# Patient Record
Sex: Male | Born: 1990 | Race: Black or African American | Hispanic: No | Marital: Single | Smoking: Former smoker
Health system: Southern US, Community
[De-identification: ages and names within clinical notes are randomized; demographics above are authoritative.]

## PROBLEM LIST (undated history)

## (undated) DIAGNOSIS — F329 Major depressive disorder, single episode, unspecified: Secondary | ICD-10-CM

## (undated) DIAGNOSIS — F4310 Post-traumatic stress disorder, unspecified: Secondary | ICD-10-CM

## (undated) DIAGNOSIS — F29 Unspecified psychosis not due to a substance or known physiological condition: Secondary | ICD-10-CM

## (undated) DIAGNOSIS — F32A Depression, unspecified: Secondary | ICD-10-CM

---

## 2010-11-24 ENCOUNTER — Emergency Department (HOSPITAL_COMMUNITY): Payer: Medicaid Other

## 2010-11-24 ENCOUNTER — Observation Stay (HOSPITAL_COMMUNITY)
Admission: EM | Admit: 2010-11-24 | Discharge: 2010-11-24 | DRG: 948 | Discharge status: Against Medical Advice | Payer: Medicaid Other | Attending: Internal Medicine | Admitting: Internal Medicine

## 2010-11-24 DIAGNOSIS — J029 Acute pharyngitis, unspecified: Secondary | ICD-10-CM | POA: Insufficient documentation

## 2010-11-24 DIAGNOSIS — R4182 Altered mental status, unspecified: Principal | ICD-10-CM | POA: Insufficient documentation

## 2010-11-24 DIAGNOSIS — R51 Headache: Secondary | ICD-10-CM | POA: Insufficient documentation

## 2010-11-24 LAB — RAPID URINE DRUG SCREEN, HOSP PERFORMED
Amphetamines: NOT DETECTED
Barbiturates: NOT DETECTED
Opiates: NOT DETECTED

## 2010-11-24 LAB — POCT I-STAT, CHEM 8
BUN: 14 mg/dL (ref 6–23)
Calcium, Ion: 1.1 mmol/L — ABNORMAL LOW (ref 1.12–1.32)
Creatinine, Ser: 1.5 mg/dL (ref 0.4–1.5)
TCO2: 28 mmol/L (ref 0–100)

## 2010-11-24 LAB — DIFFERENTIAL
Basophils Absolute: 0 10*3/uL (ref 0.0–0.1)
Basophils Relative: 0 % (ref 0–1)
Eosinophils Absolute: 0 10*3/uL (ref 0.0–0.7)
Eosinophils Relative: 0 % (ref 0–5)
Lymphocytes Relative: 9 % — ABNORMAL LOW (ref 12–46)
Lymphs Abs: 1.1 10*3/uL (ref 0.7–4.0)
Monocytes Absolute: 0.7 10*3/uL (ref 0.1–1.0)
Monocytes Relative: 6 % (ref 3–12)
Neutro Abs: 10.1 10*3/uL — ABNORMAL HIGH (ref 1.7–7.7)
Neutrophils Relative %: 85 % — ABNORMAL HIGH (ref 43–77)

## 2010-11-24 LAB — CBC
HCT: 46.4 % (ref 39.0–52.0)
Hemoglobin: 16.6 g/dL (ref 13.0–17.0)
MCH: 30.2 pg (ref 26.0–34.0)
MCHC: 35.8 g/dL (ref 30.0–36.0)
MCV: 84.4 fL (ref 78.0–100.0)
Platelets: 242 10*3/uL (ref 150–400)
RBC: 5.5 MIL/uL (ref 4.22–5.81)
RDW: 13.2 % (ref 11.5–15.5)
WBC: 11.9 10*3/uL — ABNORMAL HIGH (ref 4.0–10.5)

## 2010-11-24 LAB — COMPREHENSIVE METABOLIC PANEL
ALT: 26 U/L (ref 0–53)
AST: 27 U/L (ref 0–37)
Albumin: 4.4 g/dL (ref 3.5–5.2)
Alkaline Phosphatase: 59 U/L (ref 39–117)
Glucose, Bld: 101 mg/dL — ABNORMAL HIGH (ref 70–99)
Potassium: 3.9 mEq/L (ref 3.5–5.1)
Sodium: 138 mEq/L (ref 135–145)
Total Protein: 7.5 g/dL (ref 6.0–8.3)

## 2010-11-24 LAB — URINALYSIS, ROUTINE W REFLEX MICROSCOPIC
Glucose, UA: NEGATIVE mg/dL
Ketones, ur: NEGATIVE mg/dL
Nitrite: NEGATIVE
Protein, ur: NEGATIVE mg/dL

## 2010-11-24 LAB — APTT: aPTT: 27 seconds (ref 24–37)

## 2010-11-24 LAB — PROTIME-INR: INR: 0.97 (ref 0.00–1.49)

## 2010-11-24 LAB — RPR: RPR Ser Ql: NONREACTIVE

## 2010-11-24 LAB — URINE MICROSCOPIC-ADD ON

## 2010-11-24 LAB — ETHANOL: Alcohol, Ethyl (B): <5 mg/dL (ref 0–10)

## 2010-11-25 LAB — URINE CULTURE: Culture  Setup Time: 201204240841

## 2010-11-25 LAB — ANA: Anti Nuclear Antibody(ANA): NEGATIVE

## 2010-12-02 NOTE — H&P (Signed)
NAMEGERASIMOS, PLOTTS              ACCOUNT NO.:  [id number]  MEDICAL RECORD NO.:  [id number]           PATIENT TYPE:  I  LOCATION:  4705                         FACILITY:  [facility]  PHYSICIAN:  Massie Maroon, MD        DATE OF BIRTH:  [birth year]  DATE OF ADMISSION:  11/24/2010 DATE OF DISCHARGE:                             HISTORY & PHYSICAL   CHIEF COMPLAINT:  Headache, altered mental status, ? syncope.  HISTORY OF PRESENT ILLNESS:  A 20 year old male with complaints of some slight sore throat who was apparently walking outside when he complained of some headache as well as question of falling down according to his girlfriend.  She is not sure if he had an episode of syncope or not. She has not witnessed any overt seizure activity and there is no any evidence of incontinence.  The patient denies any fever, chills, cough. The patient had a CT brain that was negative for any acute process. Chest x-ray was negative for any acute process as well.  Ammonia level is normal.  Urine drug screen showed evidence of marijuana.  Alcohol level was normal.  The patient will be admitted for altered mental status, ? syncope.  PAST MEDICAL HISTORY:  None.  PAST SURGICAL HISTORY:  None.  SOCIAL HISTORY:  The patient smokes one pack per day for unknown duration.  He drinks occasionally.  FAMILY HISTORY:  None per the patient.  ALLERGIES:  No known drug allergies.  MEDICATIONS:  Unknown, none.  REVIEW OF SYSTEMS:  Negative for all 10-organ systems except for pertinent positives stated above.  PHYSICAL EXAMINATION:  VITAL SIGNS:  Temperature 98.8, pulse 85, blood pressure 122/72, pulse ox 100% on room air. HEENT:  Anicteric.  Pupils 1.5 mm, symmetric.  Direct consensual near reflexes intact.  Mucous membranes moist.  Tongue midline. NECK:  No JVD, no bruit. HEART:  Regular rate, rhythm.  S1, S2.  No murmurs, gallops, or rubs. LUNGS:  Clear to auscultation bilaterally. ABDOMEN:  Soft,  nontender, nondistended.  Positive bowel sounds. EXTREMITIES:  No cyanosis, clubbing, or edema. SKIN:  No rashes. LYMPH NODES:  No adenopathy. NEUROLOGIC:  Nonfocal.  Cranial nerves II through XII intact.  Reflexes 2+, symmetric, diffuse with downgoing toes bilaterally, motor strength 5/5 in all 4 extremities, pinprick intact.  LABORATORY DATA:  Ammonia 16, urine drug screen positive for marijuana. Urinalysis; wbc's 3-6.  WBC 11.9, hemoglobin 16.6, platelet count 242. BUN 14, creatinine 1.5, sodium 138, potassium 3.9.  CT brain negative for any acute process.  Chest x-ray negative for any acute process.  ASSESSMENT AND PLAN: 1. Altered mental status:  Check a B12, folic acid, ESR, ANA, RPR,     TSH. 2. ? Syncope:  Check an MRI brain, check carotid ultrasound, check     cardiac 2-D echo.  The patient will be maintained on telemetry. 3. Tobacco dependence:  The patient was counseled on smoking cessation     x10 minutes. 4. Deep venous thrombosis prophylaxis:  SCDs and TEDs.     Massie Maroon, MD     JYK/MEDQ  D:  11/24/2010  T:  11/24/2010  Job:  [id number]  Electronically Signed by Pearson Grippe MD on 12/02/2010 11:28:27 PM

## 2011-01-09 NOTE — Discharge Summary (Signed)
  NAMESHIV, SHUEY              ACCOUNT NO.:  [id number]  MEDICAL RECORD NO.:  [id number]           PATIENT TYPE:  I  LOCATION:  4705                         FACILITY:  [facility]  PHYSICIAN:  Massie Maroon, MD        DATE OF BIRTH:  June 28, 1991  DATE OF ADMISSION:  11/24/2010 DATE OF DISCHARGE:  11/24/2010                              DISCHARGE SUMMARY   DISCHARGE DIAGNOSIS: 1. Question of syncope. 2. Altered mental status. 3. Tobacco dependence.  HOSPITAL COURSE:  A 20 year old male with complaints of slight sore throat, was apparently walking outside when he complained of some headache as well as some question of falling down according to his girlfriend.  She was not sure if he had an episode of syncope or not. She did not witness any overt seizure activity and there was no evidence of any incontinence.  The patient had a CT brain that was negative for any acute process.  Ammonia level was normal.  Urine drug screen showed marijuana.  Alcohol level was normal.  The patient was supposed to be admitted for altered mental status, ?syncope; however, he left AMA.     Massie Maroon, MD     JYK/MEDQ  D:  12/17/2010  T:  12/17/2010  Job:  [id number]  Electronically Signed by Pearson Grippe MD on 01/09/2011 08:54:50 AM

## 2012-02-12 IMAGING — CT CT HEAD W/O CM
1 of 2 series · 15 of 30 positions shown, 19 images · non-contrast
Comparison: None.

CLINICAL DATA: Headache; altered level of consciousness.
Unresponsive after fall.

CT HEAD WITHOUT CONTRAST
TECHNIQUE: Contiguous axial images were obtained from the base of
the skull through the vertex without contrast.

[Series 2: head trauma 4.8 h37s · axial · 0.43mm/px · z∈[+1263,+1393]mm · 15 of 30 slices shown, 19 images]
[im 2/30  brain]
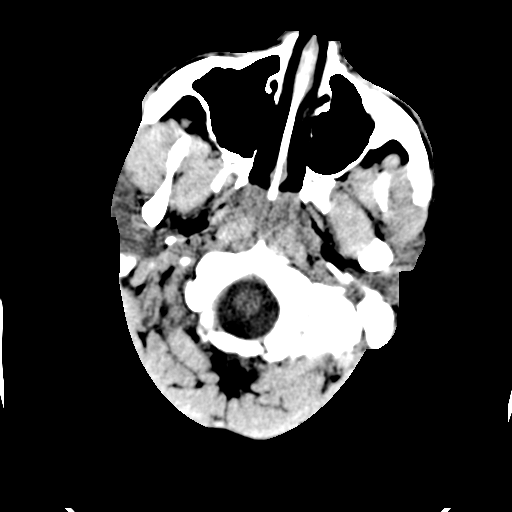
[im 2/30  bone]
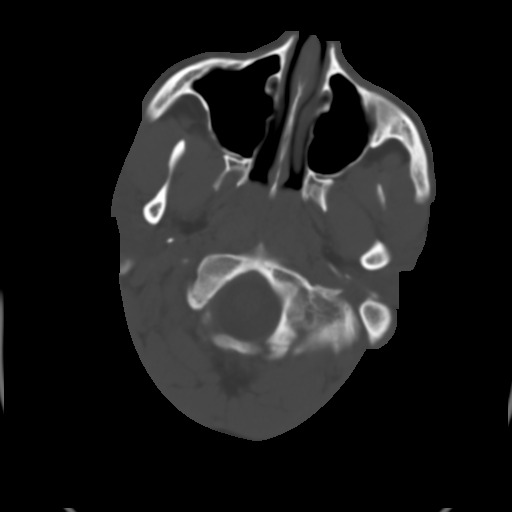
[im 4/30  brain]
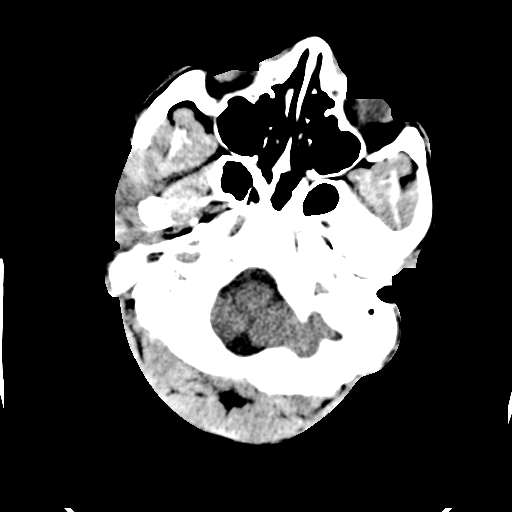
[im 5/30  brain]
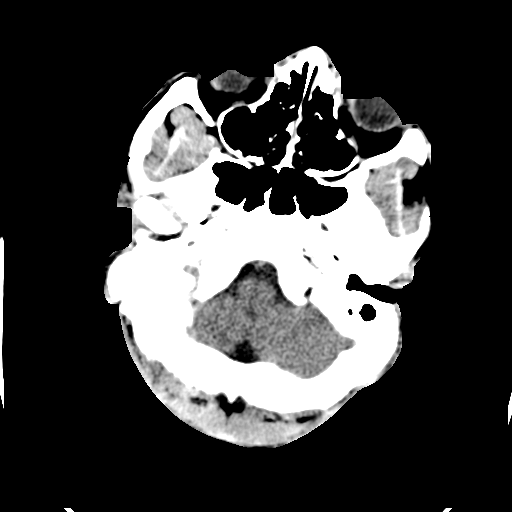
[im 7/30  brain]
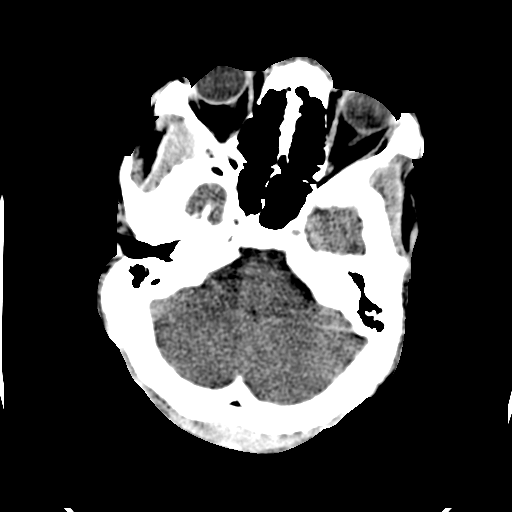
[im 10/30  brain]
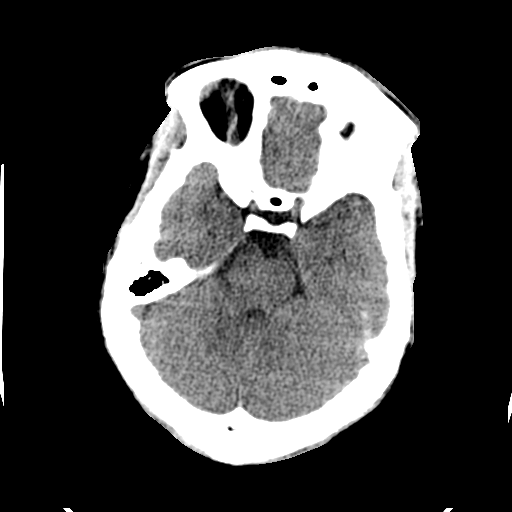
[im 10/30  bone]
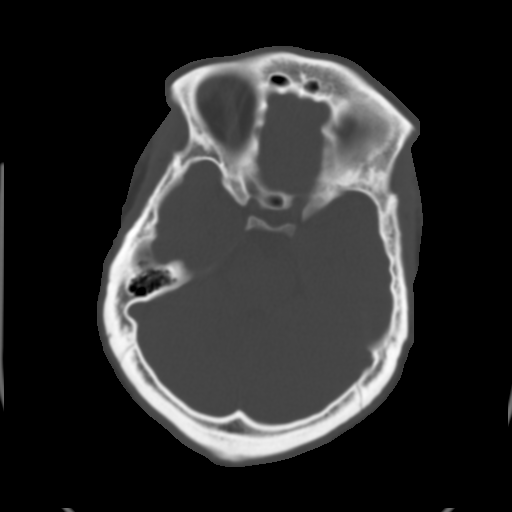
[im 12/30  brain]
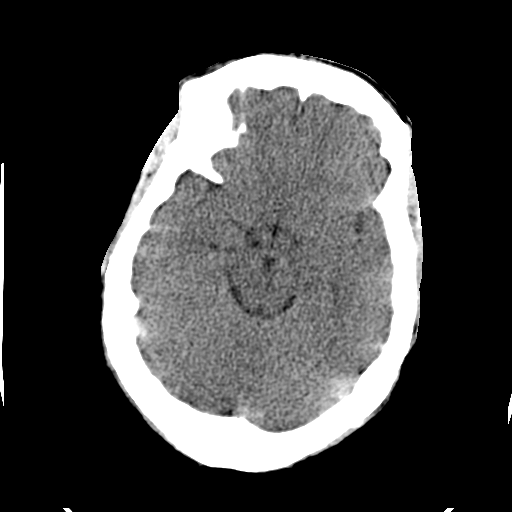
[im 13/30  brain]
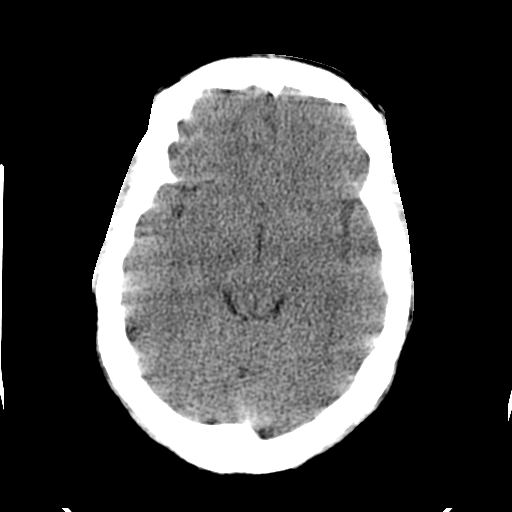
[im 15/30  brain]
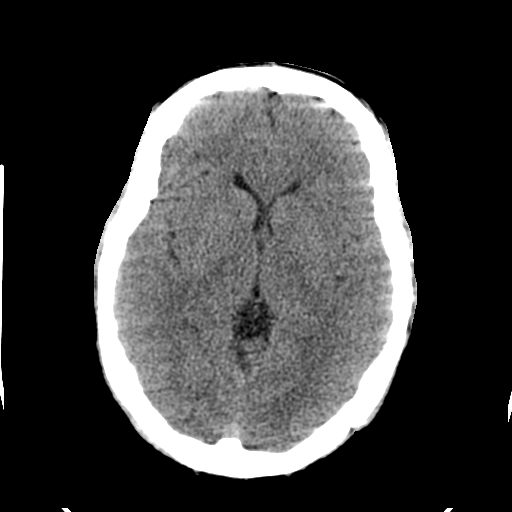
[im 17/30  brain]
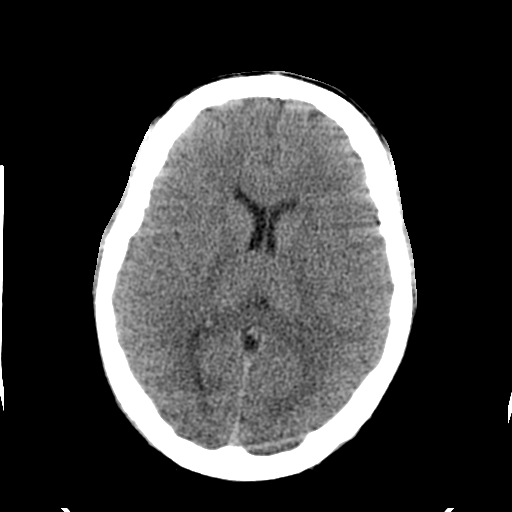
[im 17/30  bone]
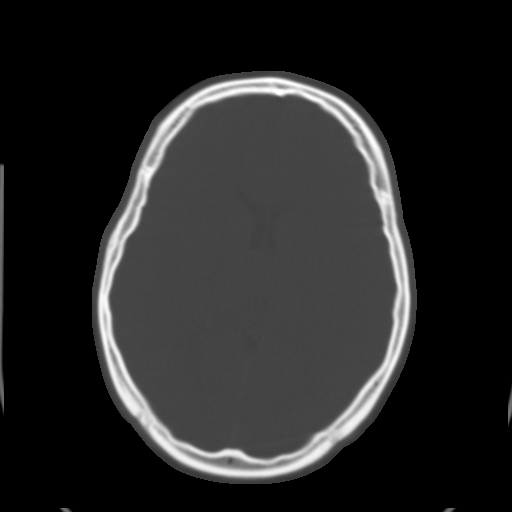
[im 18/30  brain]
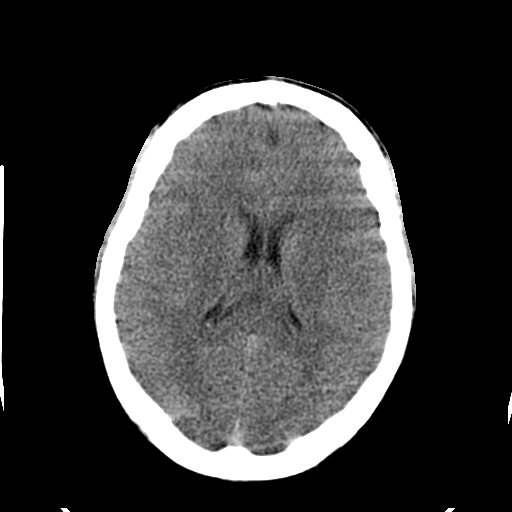
[im 20/30  brain]
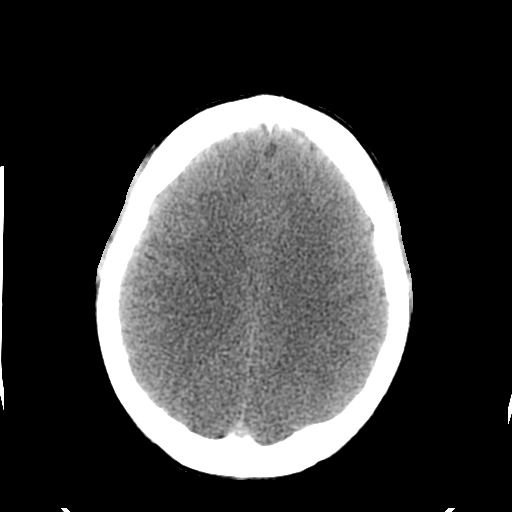
[im 23/30  brain]
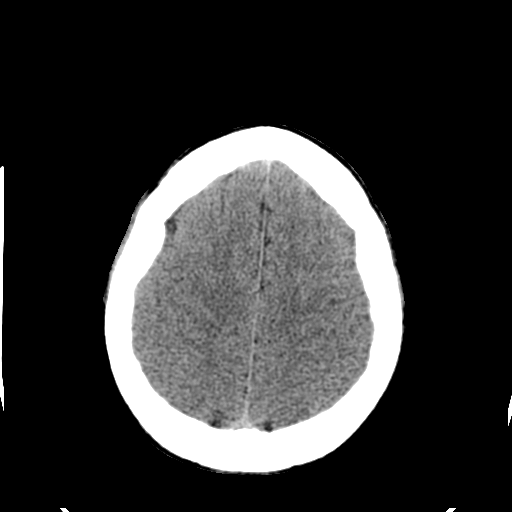
[im 25/30  brain]
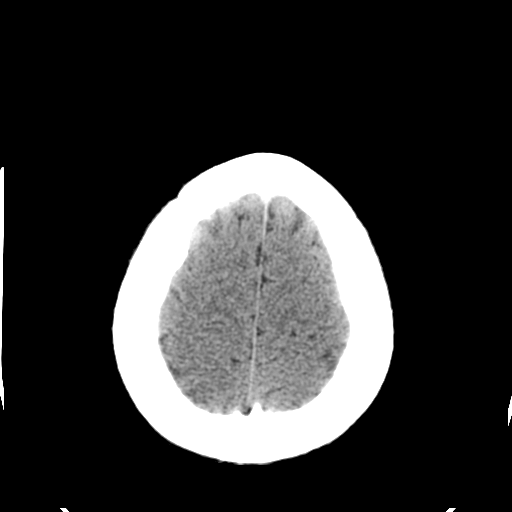
[im 25/30  bone]
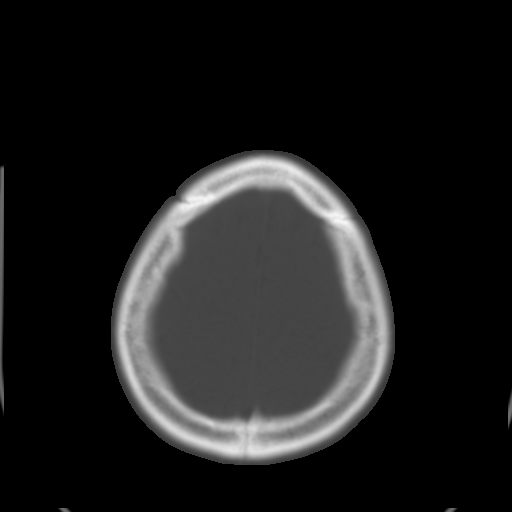
[im 26/30  brain]
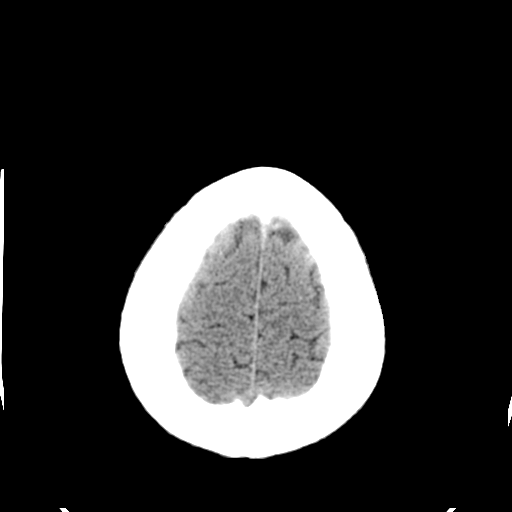
[im 28/30  brain]
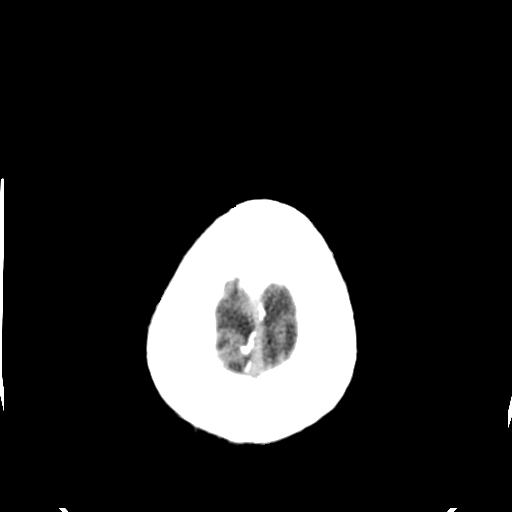

[15 of 30 positions shown; findings below may reference images not displayed]

FINDINGS: There is no evidence of acute infarction, mass lesion, or
intra- or extra-axial hemorrhage on CT.

The posterior fossa, including the cerebellum, brainstem and fourth
ventricle, is within normal limits.  The third and lateral
ventricles, and basal ganglia are unremarkable in appearance.  The
cerebral hemispheres are symmetric in appearance, with normal gray-
white differentiation.  No mass effect or midline shift is seen.

There is no evidence of fracture; visualized osseous structures are
unremarkable in appearance.  The orbits are within normal limits.
The paranasal sinuses and mastoid air cells are well-aerated.  No
significant soft tissue abnormalities are seen.
IMPRESSION: No evidence of traumatic intracranial injury or fracture.

## 2014-05-31 ENCOUNTER — Emergency Department (HOSPITAL_COMMUNITY)
Admission: EM | Admit: 2014-05-31 | Discharge: 2014-06-02 | Disposition: A | Discharge status: Other Acute Inpatient Hospital | Payer: Self-pay | Attending: Emergency Medicine | Admitting: Emergency Medicine

## 2014-05-31 ENCOUNTER — Encounter (HOSPITAL_COMMUNITY): Payer: Self-pay | Admitting: Emergency Medicine

## 2014-05-31 DIAGNOSIS — R45850 Homicidal ideations: Secondary | ICD-10-CM

## 2014-05-31 DIAGNOSIS — F29 Unspecified psychosis not due to a substance or known physiological condition: Secondary | ICD-10-CM | POA: Insufficient documentation

## 2014-05-31 DIAGNOSIS — F1210 Cannabis abuse, uncomplicated: Secondary | ICD-10-CM | POA: Insufficient documentation

## 2014-05-31 DIAGNOSIS — F39 Unspecified mood [affective] disorder: Secondary | ICD-10-CM | POA: Diagnosis present

## 2014-05-31 HISTORY — DX: Unspecified psychosis not due to a substance or known physiological condition: F29

## 2014-05-31 LAB — SALICYLATE LEVEL: Salicylate Lvl: <2 mg/dL — ABNORMAL LOW (ref 2.8–20.0)

## 2014-05-31 LAB — COMPREHENSIVE METABOLIC PANEL
ALT: 33 U/L (ref 0–53)
AST: 22 U/L (ref 0–37)
Albumin: 3.3 g/dL — ABNORMAL LOW (ref 3.5–5.2)
Alkaline Phosphatase: 45 U/L (ref 39–117)
Anion gap: 11 (ref 5–15)
BILIRUBIN TOTAL: 0.3 mg/dL (ref 0.3–1.2)
BUN: 10 mg/dL (ref 6–23)
CHLORIDE: 101 meq/L (ref 96–112)
CO2: 27 mEq/L (ref 19–32)
CREATININE: 1.14 mg/dL (ref 0.50–1.35)
Calcium: 9.1 mg/dL (ref 8.4–10.5)
GFR calc Af Amer: >90 mL/min (ref >90)
GFR, EST NON AFRICAN AMERICAN: 89 mL/min — AB (ref >90)
Glucose, Bld: 142 mg/dL — ABNORMAL HIGH (ref 70–99)
Potassium: 3.7 mEq/L (ref 3.7–5.3)
Sodium: 139 mEq/L (ref 137–147)
Total Protein: 6.5 g/dL (ref 6.0–8.3)

## 2014-05-31 LAB — CBC
HEMATOCRIT: 45.5 % (ref 39.0–52.0)
Hemoglobin: 15.8 g/dL (ref 13.0–17.0)
MCH: 29.9 pg (ref 26.0–34.0)
MCHC: 34.7 g/dL (ref 30.0–36.0)
MCV: 86 fL (ref 78.0–100.0)
PLATELETS: 264 10*3/uL (ref 150–400)
RBC: 5.29 MIL/uL (ref 4.22–5.81)
RDW: 13.2 % (ref 11.5–15.5)
WBC: 8.4 10*3/uL (ref 4.0–10.5)

## 2014-05-31 LAB — RAPID URINE DRUG SCREEN, HOSP PERFORMED
AMPHETAMINES: NOT DETECTED
BENZODIAZEPINES: NOT DETECTED
Barbiturates: NOT DETECTED
Cocaine: NOT DETECTED
Opiates: NOT DETECTED
Tetrahydrocannabinol: POSITIVE — AB

## 2014-05-31 LAB — ETHANOL

## 2014-05-31 LAB — ACETAMINOPHEN LEVEL: Acetaminophen (Tylenol), Serum: <15 ug/mL (ref 10–30)

## 2014-05-31 MED ORDER — LORAZEPAM 1 MG PO TABS: 1.0000 mg | ORAL_TABLET | Freq: Three times a day (TID) | ORAL | Status: DC | PRN

## 2014-05-31 MED ORDER — RISPERIDONE 2 MG PO TABS
2.0000 mg | ORAL_TABLET | Freq: Once | ORAL | Status: AC
  Administered 2014-06-01: 2 mg via ORAL
  Filled 2014-05-31: qty 1

## 2014-05-31 MED ORDER — ACETAMINOPHEN 325 MG PO TABS: 650.0000 mg | ORAL_TABLET | ORAL | Status: DC | PRN

## 2014-05-31 MED ORDER — BENZTROPINE MESYLATE 1 MG PO TABS
0.5000 mg | ORAL_TABLET | Freq: Two times a day (BID) | ORAL | Status: DC
  Administered 2014-06-01 (×3): 0.5 mg via ORAL
  Filled 2014-05-31: qty 1

## 2014-05-31 MED ORDER — ONDANSETRON HCL 4 MG PO TABS: 4.0000 mg | ORAL_TABLET | Freq: Three times a day (TID) | ORAL | Status: DC | PRN

## 2014-05-31 MED ORDER — IBUPROFEN 200 MG PO TABS: 600.0000 mg | ORAL_TABLET | Freq: Three times a day (TID) | ORAL | Status: DC | PRN

## 2014-05-31 NOTE — ED Notes (Signed)
Patient IVC by his [employer]. [facility]. Patient was prescribed 3 medications and has not gotten them filled. Patient making statements that different individuals "have it coming to them". [facility] and [employer] engineer, contractingmilitary staff. Per [employer] patient is "mouthy" and "pissed" about this situation.

## 2014-05-31 NOTE — BH Assessment (Signed)
Assessment completed. Awaiting copy of IVC paperwork to make recommendation.

## 2014-05-31 NOTE — ED Provider Notes (Signed)
CSN: [id number]     Arrival date & time 05/31/14  2137 History  This chart was scribed for non-physician practitioner, Terri Piedraourtney Forcucci, PA-C, working with Toy CookeyMegan Docherty, MD, by Modena JanskyAlbert Thayil, ED Scribe. This patient was seen in room [facility]CON/[facility] and the patient's care was started at 10:45 PM.  Chief Complaint  Patient presents with  . IVC    The history is provided by the patient. No language interpreter was used.   HPI Comments: Gunnar BullaDerrick Cimmino is a 23 y.o. male with no hx of chronic medical problems who presents to the Emergency Department complaining of an IVC. Pt was [employer]C by [employer]. Pt was recently seen at [facility] and prescribed 3 medications and has not gotten them filled. Pt states that he has been sleeping more than usual since his [facility] visit. Psychologist, occupationalMilitary officer reports that pt has been making generic statements of paranoia like for example, "he's going to get what's coming to him". Pt denies any recent emotional trauma. He denies any HI, SI, sleep disturbance, decreased appetite, weight change, chest pain, SOB, or abdominal pain.   Past Medical History  Diagnosis Date  . Psychotic disorder    No past surgical history on file. No family history on file. History  Substance Use Topics  . Smoking status: Not on file  . Smokeless tobacco: Not on file  . Alcohol Use: Not on file    Review of Systems  Constitutional: Negative for appetite change and unexpected weight change.  Respiratory: Negative for shortness of breath.   Cardiovascular: Negative for chest pain.  Gastrointestinal: Negative for abdominal pain.  Psychiatric/Behavioral: Negative for suicidal ideas and sleep disturbance.  All other systems reviewed and are negative.   Allergies  Review of patient's allergies indicates not on file.  Home Medications   Prior to Admission medications   Not on File   There were no vitals taken for this visit. Physical Exam  Nursing note and vitals  reviewed. Constitutional: He is oriented to person, place, and time. He appears well-developed and well-nourished. No distress.  HENT:  Head: Normocephalic and atraumatic.  Mouth/Throat: Oropharynx is clear and moist. No oropharyngeal exudate.  Neck: Neck supple. No JVD present. No thyromegaly present.  Cardiovascular: Normal rate, regular rhythm, normal heart sounds and intact distal pulses.  Exam reveals no gallop and no friction rub.   No murmur heard. Pulmonary/Chest: Effort normal and breath sounds normal. No respiratory distress. He has no wheezes. He has no rales. He exhibits no tenderness.  Abdominal: Soft. Bowel sounds are normal. He exhibits no distension and no mass. There is no tenderness. There is no rebound and no guarding.  Musculoskeletal: Normal range of motion.  Lymphadenopathy:    He has no cervical adenopathy.  Neurological: He is alert and oriented to person, place, and time.  Skin: Skin is warm.  Psychiatric: His speech is normal. His affect is blunt. He is withdrawn. Thought content is not paranoid. He expresses no homicidal and no suicidal ideation. He expresses no suicidal plans and no homicidal plans.    ED Course  Procedures (including critical care time) DIAGNOSTIC STUDIES:  COORDINATION OF CARE: 10:49 PM- Pt advised of plan for treatment and pt agrees.  Labs Review Labs Reviewed  CBC  ACETAMINOPHEN LEVEL  COMPREHENSIVE METABOLIC PANEL  ETHANOL  SALICYLATE LEVEL  URINE RAPID DRUG SCREEN (HOSP PERFORMED)    Imaging Review No results found.   EKG Interpretation None      MDM   1.  IVC  2.  Psychotic disorder  Patient is a 23 y.o. Male who presents to the ED for IVC.  Patient is not forthcoming on interview.  CO reports threatening comments being made.  Basic labs drawn here and are unremarkable.  Patient is medically cleared at this time.  Patient placed in psych hold.  Patient seen by TTS who will have the patient evaluated in the morning by  psychiatry.    I personally performed the services described in this documentation, which was scribed in my presence. The recorded information has been reviewed and is accurate.    Eben Burowourtney A Forcucci, PA-C 06/01/14 [phone]

## 2014-05-31 NOTE — ED Notes (Signed)
Patient states he is here for "nothing, I didn't bring myself here". Patient not willing to talk about why he is here or what has been going on. Patient denies PMH, PSH, denies allergies. 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 Patient states he did those when he was in [place], when questioned when that was he states "not long ago". Denies SI, Denies HI.

## 2014-06-01 ENCOUNTER — Encounter (HOSPITAL_COMMUNITY): Payer: Self-pay | Admitting: Psychiatry

## 2014-06-01 DIAGNOSIS — F39 Unspecified mood [affective] disorder: Secondary | ICD-10-CM | POA: Diagnosis present

## 2014-06-01 DIAGNOSIS — R45850 Homicidal ideations: Secondary | ICD-10-CM

## 2014-06-01 DIAGNOSIS — F29 Unspecified psychosis not due to a substance or known physiological condition: Secondary | ICD-10-CM | POA: Diagnosis present

## 2014-06-01 MED ORDER — BENZTROPINE MESYLATE 1 MG PO TABS
ORAL_TABLET | ORAL | Status: AC
  Filled 2014-06-01: qty 1

## 2014-06-01 MED ORDER — RISPERIDONE 1 MG PO TABS
1.0000 mg | ORAL_TABLET | Freq: Two times a day (BID) | ORAL | Status: DC
  Administered 2014-06-01 (×2): 1 mg via ORAL
  Filled 2014-06-01 (×2): qty 1

## 2014-06-01 NOTE — ED Notes (Signed)
On the phone 

## 2014-06-01 NOTE — ED Notes (Signed)
Up on the phone 

## 2014-06-01 NOTE — Progress Notes (Signed)
[facility] [place]  CSW informed the nursing to call report to [phone].    Maryelizabeth Rowanressa Neah Sporrer, MSW, LCSWA Evening Clinical Social Worker [phone]

## 2014-06-01 NOTE — ED Notes (Signed)
Report received from Lizbeth BarkJanie Rambo RN. Pt. Sleeping, respirations regular and unlabored.  Will continue to monitor for safety. Q 15 minute checks continue.

## 2014-06-01 NOTE — ED Notes (Signed)
Pt. To [facility] from ED ambulatory without difficulty. Report from Terri RN. Pt. Is alert and oriented, warm and dry in no distress. Pt. Denies SI, HI, and AVH. Pt. Calm and cooperative. Pt. Encouraged to let Nursing staff know of any concerns or needs.  

## 2014-06-01 NOTE — ED Notes (Signed)
Dr arfeen and jamison into see 

## 2014-06-01 NOTE — ED Notes (Signed)
In day room talking 

## 2014-06-01 NOTE — Consult Note (Signed)
[facility] Face-to-Face Psychiatry Consult   Reason for Consult:  Psychosis, homicidal ideations Referring Physician:  EDP  Carlos [name] is an 23 y.o. male. Total Time spent with patient: 45 minutes  Assessment: AXIS I:  Mood Disorder NOS and Psychotic Disorder NOS AXIS II:  Deferred AXIS III:   Past Medical History  Diagnosis Date  . Psychotic disorder    AXIS IV:  occupational problems, other psychosocial or environmental problems, problems related to social environment and problems with primary support group AXIS V:  11-20 some danger of hurting self or others possible OR occasionally fails to maintain minimal personal hygiene OR gross impairment in communication  Plan:  Recommend psychiatric Inpatient admission when medically cleared.  Dr. Adele Schilder assessed the patient and concurs with the plan.  Subjective:   Carlos [name] is a 23 y.o. male patient admitted with psychosis and homicidal ideations.  HPI:  23 y.o. male presenting to [facility] ED after being petitioned by his Statistician. Pt reported that he does not know why he is at the ED and informed this Probation officer that she should ask the [employer] and the police.  Pt denies SI, HI and AVH at this time. Pt did not report any previous suicide attempts or psychiatric hospitalizations; however it has been documented that was recently inpatient at [facility] on October 17th. When pt was asked about his diagnosis pt stated "I can see, I can hear, I am perfectly fine". Pt did not endorse any depressive symptoms nor did he share any issues with his appetite or sleep. When pt was asked about assess to weapons pt stated "I got the world that's all I need". Pt denied any alcohol or illicit substance use but his UDS is positive for THC. PT reported that he has been prescribed medications in the past but stated "I didn't know I needed them, I am a better solider than all of them".  Pt is alert and oriented x3. Pt is calm and cooperative at this time. Pt avoided eye  contact throughout this assessment. Pt speech is logical and coherent. Pt thought process is circumstantial; pt often talked about how much better the world be if Jay-Z was the president. Pt stated "Jay-Z should be the president maybe this will move faster". PT mood is euthymic and his affect is congruent with his mood. Pt reported that he is currently homeless; however he can count on his brothers for support.  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 It has also been reported that pt stated that Deke Tilghman was going to get what was coming to him with the same gun as well as [facility] getting what's coming to them. He reported that pt has sporadic train of though. Pt was recently hospitalized at [facility] for 5 days due to psychosis. He reported that pt does not have any insurance or money and has been off of his medication since being discharged from the hospital.  Today:  Patient remains psychotic with thoughts to hurt people but no one in particular.  HPI Elements:   Location:  generalized. Quality:  acute. Severity:  severe. Timing:  constant. Duration:  few days. Context:  stressors.  Past Psychiatric History: Past Medical History  Diagnosis Date  . Psychotic disorder     reports that he has been smoking Cigarettes.  He has been smoking about 3.00 packs  per day. He does not have any smokeless tobacco history on file. He reports that he uses illicit drugs (Cocaine, Marijuana, [name], and IV). He reports that he does not drink alcohol. History reviewed. No pertinent family history. Family History Substance Abuse: No Family Supports: Yes, List: (Brothers ) Living Arrangements: Other (Comment) (Homeless) Can pt return to current living arrangement?: Yes Abuse/Neglect [facility]) Physical Abuse: Denies Verbal Abuse:  Denies Sexual Abuse: Denies Allergies:  No Known Allergies  ACT Assessment Complete:  Yes:    Educational Status    Risk to Self: Risk to self with the past 6 months Suicidal Ideation: No Suicidal Intent: No Is patient at risk for suicide?: No Suicidal Plan?: No Access to Means: No What has been your use of drugs/alcohol within the last 12 months?: UDS positive for THC  Previous Attempts/Gestures: No How many times?: 0 Other Self Harm Risks: No other self harm risk identified at this time.  Triggers for Past Attempts: None known Intentional Self Injurious Behavior: None Family Suicide History: No Recent stressful life event(s): Other (Comment) (Homeless) Persecutory voices/beliefs?: No Depression: No Substance abuse history and/or treatment for substance abuse?: Yes Suicide prevention information given to non-admitted patients: Not applicable  Risk to Others: Risk to Others within the past 6 months Homicidal Ideation: No-Not Currently/Within Last 6 Months (IVC states he wanted to pop someone's head off) Thoughts of Harm to Others: No-Not Currently Present/Within Last 6 Months (IVCstates he wanted to pop someone's head off) Current Homicidal Intent: No-Not Currently/Within Last 6 Months (pop someone's head off ) Current Homicidal Plan: No-Not Currently/Within Last 6 Months (pop someone's head off ) Access to Homicidal Means:  (unknown ) Identified Victim:  (IVC identifies Esteban Kobashigawa) History of harm to others?: No Assessment of Violence: None Noted Violent Behavior Description: No violent behaviors observed at this time. Pt is calm and cooperative.  Does patient have access to weapons?:  ("I got the world that's all I need". ) Criminal Charges Pending?: No Does patient have a court date: No  Abuse: Abuse/Neglect Assessment (Assessment to be complete while patient is alone) Physical Abuse: Denies Verbal Abuse: Denies Sexual Abuse: Denies Exploitation of patient/patient's  resources: Denies Self-Neglect: Denies  Prior Inpatient Therapy: Prior Inpatient Therapy Prior Inpatient Therapy: Yes Prior Therapy Dates: 05-18-14 Prior Therapy Facilty/Provider(s): [facility] Reason for Treatment: Psychosis  Prior Outpatient Therapy: Prior Outpatient Therapy Prior Outpatient Therapy: No  Additional Information: Additional Information 1:1 In Past 12 Months?: No CIRT Risk: No Elopement Risk: No Does patient have medical clearance?: Yes                  Objective: Blood pressure 129/77, pulse 69, temperature 97.3 F (36.3 C), temperature source Oral, resp. rate 16, SpO2 100.00%.There is no height or weight on file to calculate BMI. Results for orders placed during the hospital encounter of 05/31/14 (from the past 72 hour(s))  ACETAMINOPHEN LEVEL     Status: None   Collection Time    05/31/14 10:30 PM      Result Value Ref Range   Acetaminophen (Tylenol), Serum <15.0  10 - 30 ug/mL   Comment:            THERAPEUTIC CONCENTRATIONS VARY     SIGNIFICANTLY. A RANGE OF 10-30     ug/mL MAY BE AN EFFECTIVE     CONCENTRATION FOR MANY PATIENTS.     HOWEVER, SOME ARE BEST TREATED     AT CONCENTRATIONS OUTSIDE THIS     RANGE.  ACETAMINOPHEN CONCENTRATIONS     >150 ug/mL AT 4 HOURS AFTER     INGESTION AND >50 ug/mL AT 12     HOURS AFTER INGESTION ARE     OFTEN ASSOCIATED WITH TOXIC     REACTIONS.  CBC     Status: None   Collection Time    05/31/14 10:30 PM      Result Value Ref Range   WBC 8.4  4.0 - 10.5 K/uL   RBC 5.29  4.22 - 5.81 MIL/uL   Hemoglobin 15.8  13.0 - 17.0 g/dL   HCT 45.5  39.0 - 52.0 %   MCV 86.0  78.0 - 100.0 fL   MCH 29.9  26.0 - 34.0 pg   MCHC 34.7  30.0 - 36.0 g/dL   RDW 13.2  11.5 - 15.5 %   Platelets 264  150 - 400 K/uL  COMPREHENSIVE METABOLIC PANEL     Status: Abnormal   Collection Time    05/31/14 10:30 PM      Result Value Ref Range   Sodium 139  137 - 147 mEq/L   Potassium 3.7  3.7 - 5.3 mEq/L   Chloride 101  96 - 112  mEq/L   CO2 27  19 - 32 mEq/L   Glucose, Bld 142 (*) 70 - 99 mg/dL   BUN 10  6 - 23 mg/dL   Creatinine, Ser 1.14  0.50 - 1.35 mg/dL   Calcium 9.1  8.4 - 10.5 mg/dL   Total Protein 6.5  6.0 - 8.3 g/dL   Albumin 3.3 (*) 3.5 - 5.2 g/dL   AST 22  0 - 37 U/L   ALT 33  0 - 53 U/L   Alkaline Phosphatase 45  39 - 117 U/L   Total Bilirubin 0.3  0.3 - 1.2 mg/dL   GFR calc non Af Amer 89 (*) >90 mL/min   GFR calc Af Amer >90  >90 mL/min   Comment: (NOTE)     The eGFR has been calculated using the CKD EPI equation.     This calculation has not been validated in all clinical situations.     eGFR's persistently <90 mL/min signify possible Chronic Kidney     Disease.   Anion gap 11  5 - 15  ETHANOL     Status: None   Collection Time    05/31/14 10:30 PM      Result Value Ref Range   Alcohol, Ethyl (B) <11  0 - 11 mg/dL   Comment:            LOWEST DETECTABLE LIMIT FOR     SERUM ALCOHOL IS 11 mg/dL     FOR MEDICAL PURPOSES ONLY  SALICYLATE LEVEL     Status: Abnormal   Collection Time    05/31/14 10:30 PM      Result Value Ref Range   Salicylate Lvl <6.9 (*) 2.8 - 20.0 mg/dL  URINE RAPID DRUG SCREEN (HOSP PERFORMED)     Status: Abnormal   Collection Time    05/31/14 10:57 PM      Result Value Ref Range   Opiates NONE DETECTED  NONE DETECTED   Cocaine NONE DETECTED  NONE DETECTED   Benzodiazepines NONE DETECTED  NONE DETECTED   Amphetamines NONE DETECTED  NONE DETECTED   Tetrahydrocannabinol POSITIVE (*) NONE DETECTED   Barbiturates NONE DETECTED  NONE DETECTED   Comment:            DRUG SCREEN FOR MEDICAL PURPOSES  ONLY.  IF CONFIRMATION IS NEEDED     FOR ANY PURPOSE, NOTIFY LAB     WITHIN 5 DAYS.                LOWEST DETECTABLE LIMITS     FOR URINE DRUG SCREEN     Drug Class       Cutoff (ng/mL)     Amphetamine      1000     Barbiturate      200     Benzodiazepine   034     Tricyclics       917     Opiates          300     Cocaine          300     THC              50    Labs are reviewed and are pertinent for no medical issues.  Current Facility-Administered Medications  Medication Dose Route Frequency Provider Last Rate Last Dose  . acetaminophen (TYLENOL) tablet 650 mg  650 mg Oral Q4H PRN Courtney A Forcucci, PA-C      . benztropine (COGENTIN) tablet 0.5 mg  0.5 mg Oral BID Courtney A Forcucci, PA-C   0.5 mg at 06/01/14 1012  . ibuprofen (ADVIL,MOTRIN) tablet 600 mg  600 mg Oral Q8H PRN Courtney A Forcucci, PA-C      . LORazepam (ATIVAN) tablet 1 mg  1 mg Oral Q8H PRN Courtney A Forcucci, PA-C      . ondansetron (ZOFRAN) tablet 4 mg  4 mg Oral Q8H PRN Courtney A Forcucci, PA-C       Current Outpatient Prescriptions  Medication Sig Dispense Refill  . benztropine (COGENTIN) 1 MG tablet Take 1 mg by mouth 2 (two) times daily.      Marland Kitchen RISPERIDONE PO Take by mouth.        Psychiatric Specialty Exam:     Blood pressure 129/77, pulse 69, temperature 97.3 F (36.3 C), temperature source Oral, resp. rate 16, SpO2 100.00%.There is no height or weight on file to calculate BMI.  General Appearance: Disheveled  Eye Sport and exercise psychologist::  Fair  Speech:  Pressured  Volume:  Normal  Mood:  Anxious, Euthymic and Irritable  Affect:  Blunt  Thought Process:  Irrelevant and Tangential  Orientation:  Full (Time, Place, and Person)  Thought Content:  Delusions  Suicidal Thoughts:  No  Homicidal Thoughts:  Yes.  without intent/plan  Memory:  Immediate;   Fair Recent;   Poor Remote;   Poor  Judgement:  Impaired  Insight:  Lacking  Psychomotor Activity:  Normal  Concentration:  Fair  Recall:  Poor  Fund of Knowledge:Fair  Language: Fair  Akathisia:  No  Handed:  Right  AIMS (if indicated):     Assets:  Leisure Time Physical Health Resilience  Sleep:      Musculoskeletal: Strength & Muscle Tone: within normal limits Gait & Station: normal Patient leans: N/A  Treatment Plan Summary: Daily contact with patient to assess and evaluate symptoms and progress in  treatment Medication management; admit to inpatient hospitalization for stabilization  Waylan Boga, PMH-NP 06/01/2014 1:17 PM  I have personally seen the patient and agreed with the findings and involved in the treatment plan. Berniece Andreas, MD

## 2014-06-01 NOTE — ED Provider Notes (Signed)
Patient alert and ambulatory Glasgow Coma Score 15. Cooperative pleasant. St[facility]. Results for orders placed during the hospital encounter of 05/31/14  ACETAMINOPHEN LEVEL      Result Value Ref Range   Acetaminophen (Tylenol), Serum <15.0  10 - 30 ug/mL  CBC      Result Value Ref Range   WBC 8.4  4.0 - 10.5 K/uL   RBC 5.29  4.22 - 5.81 MIL/uL   Hemoglobin 15.8  13.0 - 17.0 g/dL   HCT 16.145.5  09.639.0 - 04.552.0 %   MCV 86.0  78.0 - 100.0 fL   MCH 29.9  26.0 - 34.0 pg   MCHC 34.7  30.0 - 36.0 g/dL   RDW 40.913.2  81.111.5 - 91.415.5 %   Platelets 264  150 - 400 K/uL  COMPREHENSIVE METABOLIC PANEL      Result Value Ref Range   Sodium 139  137 - 147 mEq/L   Potassium 3.7  3.7 - 5.3 mEq/L   Chloride 101  96 - 112 mEq/L   CO2 27  19 - 32 mEq/L   Glucose, Bld 142 (*) 70 - 99 mg/dL   BUN 10  6 - 23 mg/dL   Creatinine, Ser 7.821.14  0.50 - 1.35 mg/dL   Calcium 9.1  8.4 - 95.610.5 mg/dL   Total Protein 6.5  6.0 - 8.3 g/dL   Albumin 3.3 (*) 3.5 - 5.2 g/dL   AST 22  0 - 37 U/L   ALT 33  0 - 53 U/L   Alkaline Phosphatase 45  39 - 117 U/L   Total Bilirubin 0.3  0.3 - 1.2 mg/dL   GFR calc non Af Amer 89 (*) >90 mL/min   GFR calc Af Amer >90  >90 mL/min   Anion gap 11  5 - 15  ETHANOL      Result Value Ref Range   Alcohol, Ethyl (B) <11  0 - 11 mg/dL  SALICYLATE LEVEL      Result Value Ref Range   Salicylate Lvl <2.0 (*) 2.8 - 20.0 mg/dL  URINE RAPID DRUG SCREEN (HOSP PERFORMED)      Result Value Ref Range   Opiates NONE DETECTED  NONE DETECTED   Cocaine NONE DETECTED  NONE DETECTED   Benzodiazepines NONE DETECTED  NONE DETECTED   Amphetamines NONE DETECTED  NONE DETECTED   Tetrahydrocannabinol POSITIVE (*) NONE DETECTED   Barbiturates NONE DETECTED  NONE DETECTED   No results found.   Doug SouSam Joylene Wescott, MD 06/01/14 [phone]

## 2014-06-01 NOTE — ED Notes (Signed)
Up to the bathroom 
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
## 2014-06-01 NOTE — ED Provider Notes (Signed)
Medical screening examination/treatment/procedure(s) were performed by non-physician practitioner and as supervising physician I was immediately available for consultation/collaboration.   EKG Interpretation None        Megan Docherty, MD 06/01/14 1337 

## 2014-06-01 NOTE — ED Notes (Signed)
sleeping

## 2014-06-01 NOTE — ED Notes (Signed)
Pharm tech into see 

## 2014-06-02 ENCOUNTER — Encounter (HOSPITAL_COMMUNITY): Payer: Self-pay

## 2014-06-02 ENCOUNTER — Inpatient Hospital Stay (HOSPITAL_COMMUNITY)
Admission: AD | Admit: 2014-06-02 | Discharge: 2014-06-05 | DRG: 885 | Disposition: A | Discharge status: Home / Self Care | Payer: Medicaid Other | Source: Intra-hospital | Attending: Psychiatry | Admitting: Psychiatry

## 2014-06-02 DIAGNOSIS — Z9114 Patient's other noncompliance with medication regimen: Secondary | ICD-10-CM | POA: Diagnosis present

## 2014-06-02 DIAGNOSIS — F29 Unspecified psychosis not due to a substance or known physiological condition: Secondary | ICD-10-CM

## 2014-06-02 DIAGNOSIS — F1220 Cannabis dependence, uncomplicated: Secondary | ICD-10-CM | POA: Diagnosis present

## 2014-06-02 DIAGNOSIS — F1490 Cocaine use, unspecified, uncomplicated: Secondary | ICD-10-CM | POA: Diagnosis present

## 2014-06-02 DIAGNOSIS — F17210 Nicotine dependence, cigarettes, uncomplicated: Secondary | ICD-10-CM | POA: Diagnosis present

## 2014-06-02 DIAGNOSIS — Z609 Problem related to social environment, unspecified: Secondary | ICD-10-CM | POA: Diagnosis present

## 2014-06-02 DIAGNOSIS — F209 Schizophrenia, unspecified: Secondary | ICD-10-CM | POA: Diagnosis present

## 2014-06-02 DIAGNOSIS — F1590 Other stimulant use, unspecified, uncomplicated: Secondary | ICD-10-CM | POA: Diagnosis present

## 2014-06-02 DIAGNOSIS — F39 Unspecified mood [affective] disorder: Secondary | ICD-10-CM

## 2014-06-02 DIAGNOSIS — Z590 Homelessness: Secondary | ICD-10-CM | POA: Diagnosis not present

## 2014-06-02 DIAGNOSIS — F203 Undifferentiated schizophrenia: Secondary | ICD-10-CM | POA: Insufficient documentation

## 2014-06-02 DIAGNOSIS — F17200 Nicotine dependence, unspecified, uncomplicated: Secondary | ICD-10-CM | POA: Insufficient documentation

## 2014-06-02 DIAGNOSIS — F1994 Other psychoactive substance use, unspecified with psychoactive substance-induced mood disorder: Secondary | ICD-10-CM

## 2014-06-02 MED ORDER — ACETAMINOPHEN 325 MG PO TABS: 650.0000 mg | ORAL_TABLET | Freq: Four times a day (QID) | ORAL | Status: DC | PRN

## 2014-06-02 MED ORDER — DIPHENHYDRAMINE HCL 25 MG PO CAPS
25.0000 mg | ORAL_CAPSULE | Freq: Every evening | ORAL | Status: DC | PRN
  Administered 2014-06-02: 25 mg via ORAL
  Filled 2014-06-02: qty 1

## 2014-06-02 MED ORDER — BENZTROPINE MESYLATE 1 MG PO TABS
1.0000 mg | ORAL_TABLET | Freq: Two times a day (BID) | ORAL | Status: DC
  Administered 2014-06-02 – 2014-06-05 (×7): 1 mg via ORAL
  Filled 2014-06-02 (×3): qty 1
  Filled 2014-06-02: qty 28
  Filled 2014-06-02 (×3): qty 1
  Filled 2014-06-02: qty 28
  Filled 2014-06-02 (×5): qty 1

## 2014-06-02 MED ORDER — MAGNESIUM HYDROXIDE 400 MG/5ML PO SUSP
30.0000 mL | Freq: Every day | ORAL | Status: DC | PRN
Start: 2014-06-02 — End: 2014-06-06

## 2014-06-02 MED ORDER — RISPERIDONE 3 MG PO TABS
3.0000 mg | ORAL_TABLET | Freq: Two times a day (BID) | ORAL | Status: DC
Start: 2014-06-02 — End: 2014-06-06
  Administered 2014-06-02 – 2014-06-05 (×7): 3 mg via ORAL
  Filled 2014-06-02 (×2): qty 1
  Filled 2014-06-02: qty 28
  Filled 2014-06-02 (×3): qty 1
  Filled 2014-06-02: qty 28
  Filled 2014-06-02 (×4): qty 1
  Filled 2014-06-02: qty 3

## 2014-06-02 MED ORDER — ALUM & MAG HYDROXIDE-SIMETH 200-200-20 MG/5ML PO SUSP: 30.0000 mL | ORAL | Status: DC | PRN

## 2014-06-02 NOTE — [facility] Group Notes (Signed)
[facility] Group Notes:  (Nursing/MHT/Case Management/Adjunct)  Date:  06/02/2014  Time:  1:26 PM  oup.    Type of Therapy:  Psychoeducational Skills-Healthy Support Systems   Participation Level:  Active  Participation Quality:  Appropriate  Affect:  Appropriate  Cognitive:  Appropriate  Insight:  Appropriate  Engagement in Group:  Engaged  Modes of Intervention:  Discussion  Summary of Progress/Problems: Pt did attend healthy support systems group.   Jacquelyne BalintForrest, Corrissa Martello Shanta 06/02/2014, 1:26 PM

## 2014-06-02 NOTE — Tx Team (Signed)
Initial Interdisciplinary Treatment Plan   PATIENT STRESSORS: Health problems Medication change or noncompliance   PROBLEM LIST: Problem List/Patient Goals Date to be addressed Date deferred Reason deferred Estimated date of resolution  psychosis 06/02/14     Risk for suicide 06/02/14                                                DISCHARGE CRITERIA:  Ability to meet basic life and health needs Improved stabilization in mood, thinking, and/or behavior Verbal commitment to aftercare and medication compliance  PRELIMINARY DISCHARGE PLAN: Attend aftercare/continuing care group Outpatient therapy  PATIENT/FAMIILY INVOLVEMENT: This treatment plan has been presented to and reviewed with the patient, Carlos [name].  The patient and family have been given the opportunity to ask questions and make suggestions.  Jacques Navyhillips, Carlos [name] 06/02/2014, 6:44 AM
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
## 2014-06-02 NOTE — Progress Notes (Signed)
Patient ID: Gunnar BullaDerrick Olveda, male   DOB: 12/09/1990, 23 y.o.   MRN: [id number] D. Pt presents with disorganized thoughts, tangential speech. Pt reports ''[employer] brought me here. '' He remains guarded, with sporadic train of thought. He states '' My spirituality is what I'm going to work on. To be good for the elders. '' He has been cooperative with staff, and attending unit programming. Pt states '' I don't know why they think I need to be in here, I've never heard voices before. ''  A. Medications given as ordered. Support and encouragement provided. Discussed above information with Julieanne CottonJosephine NP and Dr. Jama Flavorsobos. R. Patient is safe. In no acute distress at this time. Will continue to monitor q 15 minutes for safety.

## 2014-06-02 NOTE — Progress Notes (Signed)
Writer spoke with patient 1:1 and he reports having a good day and commented that "any day I'm alive is a good day." Patient has been up and in a jolly mood. Patient reports uncertainty as to why he is here and says that he does not currently see a therapist and feels that there is not a need for him to see one b/c he has a brother that he can talk to about his issues and he finds that helpful. Patient is uncertain how he will pay for his medications once discharged and wants to find out if the [employer] will be paying for them. Writer encouraged him to speak with his social worker concerning his medications and who will be payng for them. Writer spoke with him on the importance of staying on his medications once discharged and he was receptive. Patient currently denies si/hi/a/v hallucinations. Patient appears to be unorganized in his thought process but is pleasant. Support and encouragement given, safety maintained on unit with 15 min check.

## 2014-06-02 NOTE — [facility] Group Notes (Signed)
[facility] LCSW Group Therapy 06/02/2014   Type of Therapy: Group Therapy- Feelings Around Discharge & Establishing a Supportive Framework  Participation Level: Active   Participation Quality:  Monopolizing  Affect:  Appropriate   Cognitive: Alert and Oriented   Insight:  Limited   Engagement in Therapy: Developing/Improving and Engaged   Modes of Intervention: Clarification, Confrontation, Discussion, Education, Exploration, Limit-setting, Orientation, Problem-solving, Rapport Building, Dance movement psychotherapisteality Testing, Socialization and Support   Description of Group:   What is a supportive framework? What does it look like feel like and how do I discern it from and unhealthy non-supportive network? Learn how to cope when supports are not helpful and don't support you. Discuss what to do when your family/friends are not supportive. Pt monopolized group discussion with circumstantial speech and somewhat disorganized thought process. Pt responded to prompts by CSW but were hard to follow as Pt used vague language and changed topics fluidly. Pt was focused on the difference between trustworthiness and loyalty.  He identified having a person to keep him accountable was considered to be a positive support in his life. He identified his twin brother as a positive support and reports the need to engage in more productive activities with his brother in order to better utilize him as a positive support.   Therapeutic Modalities:   Cognitive Behavioral Therapy Person-Centered Therapy Motivational Interviewing   Chad CordialLauren Carter, LCSWA 06/02/2014 3:12 PM

## 2014-06-02 NOTE — ED Notes (Signed)
[facility]

## 2014-06-02 NOTE — [facility] Group Notes (Signed)
[facility] Group Notes:  (Nursing/MHT/Case Management/Adjunct)  Date:  06/02/2014  Time:  1:25 PM  Type of Therapy:  Psychoeducational Skills-Pt Self Inventory Group   Participation Level:  Active  Participation Quality:  Appropriate  Affect:  Appropriate  Cognitive:  Appropriate  Insight:  Appropriate  Engagement in Group:  Engaged  Modes of Intervention:  Discussion  Summary of Progress/Problems: Pt did attend self inventory group.     Carlos [name], Aydin Hink Shanta 06/02/2014, 1:25 PM

## 2014-06-02 NOTE — Plan of Care (Signed)
Problem: Alteration in thought process Goal: LTG-Patient verbalizes understanding importance med regimen (Patient verbalizes understanding of importance of medication regimen and need to continue outpatient care.)  Outcome: Progressing Patient is receptive and verbalizes understanding the importance of staying on his medications once discharged.

## 2014-06-02 NOTE — Progress Notes (Signed)
Patient ID: Carlos [name], male   DOB: 01/10/1991, 23 y.o.   MRN: [id number]  Admission Note:  D:23 yr male who presents IVC in no acute distress for the treatment of psychosis and non compliance . Pt appears flat and depressed. Pt was calm and cooperative with admission process. Pt denies SI/HI/AVH/Pain at this time. Pt responding to internal stimuli, and appears delusional, with flight of ideas . Pt is guarded, and a poor historian. [facility] so he's been non-compliant since then.   A:Skin was assessed and found to be clear of any abnormal marks apart from a scar on L-temple area. POC and unit policies explained and understanding verbalized. Consents obtained.   Food and fluids offered, and rejected.  R: Pt had no additional questions or concerns.
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
## 2014-06-03 DIAGNOSIS — F17200 Nicotine dependence, unspecified, uncomplicated: Secondary | ICD-10-CM | POA: Insufficient documentation

## 2014-06-03 DIAGNOSIS — F1290 Cannabis use, unspecified, uncomplicated: Secondary | ICD-10-CM

## 2014-06-03 DIAGNOSIS — F209 Schizophrenia, unspecified: Principal | ICD-10-CM

## 2014-06-03 DIAGNOSIS — F1220 Cannabis dependence, uncomplicated: Secondary | ICD-10-CM | POA: Insufficient documentation

## 2014-06-03 DIAGNOSIS — Z720 Tobacco use: Secondary | ICD-10-CM

## 2014-06-03 DIAGNOSIS — F29 Unspecified psychosis not due to a substance or known physiological condition: Secondary | ICD-10-CM | POA: Insufficient documentation

## 2014-06-03 MED ORDER — TRAZODONE HCL 50 MG PO TABS
50.0000 mg | ORAL_TABLET | Freq: Every evening | ORAL | Status: DC | PRN
  Administered 2014-06-03: 50 mg via ORAL
  Filled 2014-06-03: qty 1
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
## 2014-06-03 NOTE — Tx Team (Addendum)
Interdisciplinary Treatment Plan Update (Adult)   Date: 06/03/2014   Time Reviewed: 11:12 AM  Progress in Treatment:  Attending groups: Yes  Participating in groups:  Yes  Taking medication as prescribed: Yes  Tolerating medication: Yes  Family/Significant othe contact made: Not yet. Pt agreeable to consenting to family contact (with twin brother)  Patient understands diagnosis: poor insight, as pt does not know or understand why he was admitted and is hoping for d/c by Tuesday.  Discussing patient identified problems/goals with staff: Yes  Medical problems stabilized or resolved: Yes  Denies suicidal/homicidal ideation: Yes during group and self report.  Patient has not harmed self or Others: Yes  New problem(s) identified:  Discharge Plan or Barriers: Pt plans to return home at d/c where he lives with his brother. Pt works part time at [employer] and has interview on Wed with [employer]. [facility]. Pt reports that his army SGT is in the process of helping him with this.  Additional comments: AA male, 23 years old was seen in his room sitting . He reports that he was brought in by Engineer, materialsarmy law enforcement staff for reasons he does not know. 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 He also reported that he completed collage education at [employer] in [place]. Patient stated that [facility]. Patient admitted that he could not afford the medications he was prescribed and did not take them. He states this is his second hospitalization to any Psychiatric hospital. Patient is alert and oriented but stated that he does not think he needed to be in the hospital Patient stated that he has job interviews lined up for this week. He admitted to  smoking Marijuana but denied using Alcohol and cocaine. Patient reports good sleep and appetite since he came in the hospital. Patient states he live with a friend and spends time with one of his brothers. He reported a diagnosis of ADD as a child but did not report medical treatment for that. He denies SI/HI/AVH. Patient is being medicated accordingly and safety is provided. Patient is encouraged to participate in group. Unable to reach his brother Jerrick for collateral information at this time. Reason for Continuation of Hospitalization: Medication management  Estimated length of stay: 1-2 days  For review of initial/current patient goals, please see plan of care.  Attendees:  Patient:    Family:    Physician: Dr. Elna BreslowEappen, MD 06/03/2014 11:12 AM   Nursing: Waverly FerrariMarian, Brittany RN 06/03/2014 11:12 AM   Clinical Social Worker Ellory Khurana Smart, LCSWA  06/03/2014 11:12 AM   Other: Daryel Geraldodney North, LCSW 06/03/2014 11:12 AM   Other: Darden DatesJennifer C. Nurse CM 06/03/2014 11:12 AM   Other: Tomasita Morrowelora Sutton, Community Care Coordinator  06/03/2014 11:12 AM   Other:    Scribe for Treatment Team:  Trula SladeHeather Smart LCSWA 06/03/2014 11:12 AM   Pt and CSW reviewed pt's identified goals and treatment plan. Pt verbalized understanding and agreed to treatment plan.  The Sher[employer]WilliamsHeather Smart, LCSWA 06/03/2014 1:47 PM

## 2014-06-03 NOTE — BHH Group Notes (Signed)
[facility] LCSW Group Therapy  06/03/2014 1:15 pm  Type of Therapy: Process Group Therapy  Participation Level:  Active  Participation Quality:  Appropriate  Affect:  Flat  Cognitive:  Oriented  Insight:  Improving  Engagement in Group:  Limited  Engagement in Therapy:  Limited  Modes of Intervention:  Activity, Clarification, Education, Problem-solving and Support  Summary of Progress/Problems: Today's group addressed the issue of overcoming obstacles.  Patients were asked to identify their biggest obstacle post d/c that stands in the way of their on-going success, and then problem solve as to how to manage this. Carlos [name] talked at length about his relationship with his mother who passed away 4 years ago.  She was obviously a strong influence, and he is trying to do his best because of the values she instilled in him.  He is in the [employer] and [employer]ilitary for this reason.  He is also trying to get another job for either [employer] or [employer].  Has interviews for later this week.  "I want to be able to have good income and not be cold this winter."  Gave another patient feedback about dealing with grief and loss.    Carlos [name], Carlos [name] 06/03/2014   4:15 PM

## 2014-06-03 NOTE — Progress Notes (Signed)
Patient up and visible in milieu. Denying all symptoms psychiatrically. Rates depression, anxiety and hopelessness as a "0." Forwards minimal information and appears superficial. Encouraged and supported. Medicated per orders. He denies SI/HI and remains safe. Lawrence MarseillesFriedman, Amamda Curbow Eakes

## 2014-06-03 NOTE — Progress Notes (Signed)
Patient ID: Gunnar BullaDerrick Caul, male   DOB: 06/13/1991, 23 y.o.   MRN: [id number]   D: Pt informed the writer that he may be discharged tomorrow. Stated that he has 2 interviews set up, one with [employer] and the other with [employer]. When asked if he had any questions or concerns pt asked if he was "gonna get his fill ups before he leaves". Through Community education officerconversation writer and other staff clarified that pt was speaking of refills.  Stated, "that's what happened to me the last time. I didn't know I needed to get them".   A:  Support and encouragement was offered. 15 min checks continued for safety.  R: Pt remains safe.

## 2014-06-03 NOTE — Plan of Care (Signed)
Problem: Ineffective individual coping Goal: STG: Patient will remain free from self harm Outcome: Progressing Patient has not engaged in self harm and denies SI.  Problem: Alteration in thought process Goal: STG-Patient is able to follow short directions Outcome: Progressing Patient is pleasant, cooperative with short directions by staff.

## 2014-06-03 NOTE — [facility] Group Notes (Signed)
[facility] LCSW Aftercare Discharge Planning Group Note   06/03/2014 11:10 AM  Participation Quality:  Appropriate   Mood/Affect:  Appropriate  Depression Rating:  0  Anxiety Rating:  2  Thoughts of Suicide:  No Will you contract for safety?   NA  Current AVH:  No  Plan for Discharge/Comments:  Pt reports that he is unclear as to how or [facility]. 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 Pt reports the was recently at [facility] and had not been taking meds. [place] in [place] and is open to going to [facility].   Transportation Means: bus pass   Supports: some family supports (twin brother) with whom he lives.   Smart, LandAmerica FinancialHeather  LCSWA

## 2014-06-03 NOTE — Progress Notes (Signed)
[facility] Group Notes:  (Nursing/MHT/Case Management/Adjunct)  Date:  06/03/2014  Time:  12:57 AM  Type of Therapy:  Psychoeducational Skills  Participation Level:  Active  Participation Quality:  Redirectable  Affect:  Appropriate  Cognitive:  Appropriate  Insight:  Appropriate  Engagement in Group:  Monopolizing  Modes of Intervention:  Education  Summary of Progress/Problems: The patient verbalized in group that he had a good day since he was able to socialize with his peers. The patient was redirected for talking to the student who was seated next to him. As a theme for the day, his support system will be comprised of his brothers and a close friend.   Hazle CocaGOODMAN, Dyron Kawano S 06/03/2014, 12:57 AM

## 2014-06-04 LAB — LIPID PANEL
Cholesterol: 191 mg/dL (ref 0–200)
HDL: 67 mg/dL (ref >39)
LDL CALC: 114 mg/dL — AB (ref 0–99)
Total CHOL/HDL Ratio: 2.9 RATIO
Triglycerides: 49 mg/dL (ref <150)
VLDL: 10 mg/dL (ref 0–40)

## 2014-06-04 LAB — HEMOGLOBIN A1C
Hgb A1c MFr Bld: 5.5 % (ref <5.7)
MEAN PLASMA GLUCOSE: 111 mg/dL (ref <117)

## 2014-06-04 LAB — TSH: TSH: 3.93 u[IU]/mL (ref 0.350–4.500)

## 2014-06-04 MED ORDER — TRAZODONE HCL 100 MG PO TABS
100.0000 mg | ORAL_TABLET | Freq: Every day | ORAL | Status: DC
  Administered 2014-06-04: 100 mg via ORAL
  Filled 2014-06-04 (×3): qty 1

## 2014-06-04 NOTE — Progress Notes (Signed)
The focus of this group is to help patients review their daily goal of treatment and discuss progress on daily workbooks. Pt attended the evening group session and responded to all discussion prompts from the Writer. Pt shared that today was a good day on the unit, the highlight of which was seeing several birds in the courtyard garden after breakfast. Pt reported having seen a woodpecker, a robin and another bird, which he took as a sign that today was going to be good. "I like watching nature, but I don't take time to do it often enough." Pt's affect in group was appropriate and he volunteered several encouraging comments to his peers.

## 2014-06-04 NOTE — Progress Notes (Signed)
Patient continues to deny all psychiatric symptoms. He is organized in his thinking, speech is clear and mood is pleasant. Patient is somewhat superficial however. He is up and visible in the milieu, attending groups.  Discussed care with MD and treatment team. Medicated without difficulty per orders. Support offered. Patient continues to ask for discharge as he indicates he has a job interview tomorrow at [employer]. Will continue to monitor patient's response to medication regime. He denies SI/HI/AVH and remains safe. Will obtain EKG this afternoon. Lawrence MarseillesFriedman, Alvia Tory Eakes

## 2014-06-04 NOTE — B[facility] Group Notes (Signed)
[facility] LCSW Group Therapy  06/04/2014 , 11:26 AM   Type of Therapy:  Group Therapy  Participation Level:  Active  Participation Quality:  Attentive  Affect:  Appropriate  Cognitive:  Alert  Insight:  Improving  Engagement in Therapy:  Engaged  Modes of Intervention:  Discussion, Exploration and Socialization  Summary of Progress/Problems: Today's group focused on the term Diagnosis.  Participants were asked to define the term, and then pronounce whether it is a negative, positive or neutral term.  Talked about the importance of writing/journaling for him. "I don't think people understand me when I talk to them, but when I write it down, it seems to work better.  Family will always try to pick you up when you are down.  And if your family don't, then you need to make your own family."  Carlos FlesherWent into a lengthy explanation of his collage that he had done earlier in the day.  Brought up his desire to get out of here several times as he has a job Copyinterview tomorrow.  "I don't want to be cold this winter." Carlos [name], Carlos [name] 06/04/2014 , 11:26 AM

## 2014-06-04 NOTE — Clinical Social Work Note (Signed)
Per Dr. Elvera MariaEappen's request, pt signed release for [facility] allowing [facility] to receive information regarding his visit to [facility] a few weeks ago. CSW contacted [facility] operator at [phone] and got fax number to medical records from [phone]. Fax sent (including signed release of information).   The Sherwin-WilliamsHeather Smart, LCSWA 06/04/2014 10:25 AM

## 2014-06-04 NOTE — Progress Notes (Signed)
Patient ID: Carlos [name], male   DOB: 09/23/1990, 23 y.o.   MRN: [id number]   D: When asked about his day pt stated, "pretty good just relaxing and watching movies. When asked about his conversations with his dr pt stated, "hopefully they can get me out tomorrow because I need to get one of the two jobs that I'm trying for. When asked if he had any questions or concerns pt stated, "No, I just hope they stick to their word".  A:  Support and encouragement was offered. 15 min checks continued for safety.  R: Pt remains safe.

## 2014-06-04 NOTE — Plan of Care (Signed)
Problem: Ineffective individual coping Goal: STG-Increase in ability to manage activities of daily living Outcome: Progressing Patient is up and active in milieu. Able to care for self.  Problem: Alteration in thought process Goal: STG-Patient does not respond to command hallucinations Outcome: Completed/Met Date Met:  06/04/14 Patient denies hallucinations, command or otherwise.

## 2014-06-04 NOTE — Progress Notes (Signed)
[facility] MD Progress Note  06/04/2014 12:08 PM Carlos [name]  MRN:  [id number] Subjective: Patient states, ' I feel my medications are working for me."  Objective: Carlos [name] is a 23 y.o. AA male  who presented  IVC ed by [employer]. Pt was recently seen [facility] and prescribed 3 medications ,but never got them filled. Per ED notes  Military officer reported  that pt has been making generic statements of paranoia like for example, "he's going to get what's coming to him". Pt on admission also had HI thoughts.  Patient today is more organized ,and improving.  Pt denies SI/HI/AH/VH. Pt denies paranoia or any delusions. Pt is fixed on discharge and reports that he has an interview tomorrow at 1:00 PM and does not want to miss it.  Pt reports sleep as OK and appetite as fair. Pt denies any side effects of medications.  Per nursing staff ,no out bursts or agitation noted. Pt is compliant with treatment milieu.    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 rate 20, height 6' (1.829 m), weight 82.101 kg (181 lb).Body mass index is 24.54 kg/(m^2).  General Appearance: Casual  Eye Contact::  Fair  Speech:  Normal Rate  Volume:  Normal  Mood:  Anxious  Affect:  Flat  Thought Process:  more organized and logical.  Orientation:  Full (Time, Place, and Person)  Thought Content:  less paranoid ,continues to feel that the millitary is focussing on his past hx and not on his future  .So wants to get a second job.   Suicidal Thoughts:  No  Homicidal Thoughts:  No  Memory:  Immediate;   Fair Recent;   Fair Remote;   Fair  Judgement:  Fair  Insight:  Fair and Lacking  Psychomotor Activity:  Normal  Concentration:  Fair  Recall:  [employer] of Knowledge:Fair  Language: Good  Akathisia:  No  Handed:  Right  AIMS (if indicated):     Assets:  Physical Health Others:  access to health care  Sleep:  Number of Hours: 6.25   Musculoskeletal: Strength & Muscle Tone: within normal limits Gait & Station: normal Patient leans: N/A  Current Medications: Current Facility-Administered Medications  Medication Dose Route Frequency Provider Last Rate Last Dose  . acetaminophen (TYLENOL) tablet 650 mg  650 mg Oral Q6H PRN Kristeen Mans, NP      . alum & mag hydroxide-simeth (MAALOX/MYLANTA) 200-200-20 MG/5ML suspension 30 mL  30 mL Oral Q4H PRN Kristeen Mans, NP      . benztropine (COGENTIN) tablet 1 mg  1 mg Oral BID Kristeen Mans, NP   1 mg at 06/04/14 1191  . magnesium hydroxide (MILK OF MAGNESIA) suspension 30 mL  30 mL Oral Daily PRN Kristeen Mans, NP      . risperiDONE (RISPERDAL) tablet 3 mg  3 mg Oral BID Kristeen Mans, NP   3 mg at 06/04/14 0820  . traZODone (DESYREL) tablet 50 mg  50 mg Oral QHS PRN  Jomarie LongsSaramma Tyrion Glaude, MD   50 mg at 06/03/14 2136    Lab Results:  Results for orders placed or performed during the hospital encounter of 06/02/14 (from the past 48 hour(s))  Lipid panel     Status: Abnormal   Collection Time: 06/04/14  6:25 AM  Result Value Ref Range   Cholesterol 191 0 - 200 mg/dL   Triglycerides 49 <098<150 mg/dL   HDL 67 >11>39 mg/dL   Total CHOL/HDL Ratio 2.9 RATIO   VLDL 10 0 - 40 mg/dL   LDL Cholesterol 914114 (H) 0 - 99 mg/dL    Comment:        Total Cholesterol/HDL:CHD Risk Coronary Heart Disease Risk Table                     Men   Women  1/2 Average Risk   3.4   3.3  Average Risk       5.0   4.4  2 X Average Risk   9.6   7.1  3 X Average Risk   23.4   11.0        Use the calculated Patient Ratio above and the CHD Risk Table to determine the patient's CHD Risk.        ATP III CLASSIFICATION (LDL):  <100     mg/dL   Optimal  [id number]  mg/dL   Near or Above                    Optimal  130-159  mg/dL   Borderline  213-086160-189  mg/dL   High  >578>190     mg/dL   Very High Performed at [facility]   Hemoglobin A1c     Status: None   Collection Time: 06/04/14  6:25 AM  Result Value Ref Range   Hgb A1c MFr Bld 5.5 <5.7 %    Comment: (NOTE)                                                                       According to the ADA Clinical Practice Recommendations for 2011, when HbA1c is used as a screening test:  >=6.5%   Diagnostic of Diabetes Mellitus           (if abnormal result is confirmed) 5.7-6.4%   Increased risk of developing Diabetes Mellitus References:Diagnosis and Classification of Diabetes Mellitus,Diabetes Care,2011,34(Suppl 1):S62-S69 and Standards of Medical Care in         Diabetes - 2011,Diabetes Care,2011,34 (Suppl 1):S11-S61.    Mean Plasma Glucose 111 <117 mg/dL    Comment: Performed at [facility]    Physical Findings: AIMS: Facial and Oral Movements Muscles of Facial Expression: None, normal Lips and Perioral Area: None, normal Jaw: None, normal Tongue: None, normal,Extremity Movements Upper (arms, wrists, hands, fingers): None, normal Lower (legs, knees, ankles, toes): None, normal, Trunk Movements Neck, shoulders, hips: None, normal, Overall Severity Severity of abnormal movements (highest score from questions above): None, normal Incapacitation due to abnormal movements: None, normal Patient's awareness of abnormal movements (rate only patient's report): No Awareness, Dental Status Current problems with teeth and/or dentures?: No Does patient usually wear dentures?: No  CIWA:  CIWA-Ar Total: 0 COWS:  COWS Total  Score: 0  Treatment Plan Summary: Daily contact with patient to  assess and evaluate symptoms and progress in treatment Medication management   ASSESSMENT /Plan: Patient is a limited historian. Pt has hx of psychiatric illness ,but was noncompliant on his medications. [facility] . Pt also has cannabis use disorder. Attempted to obtain collateral from his family -tried all the phone numbers in chart as well as called the phone number of his brother that pt had provided - [phone] -no response.  Will continue Risperdal 3 mg po bid for psychosis. Plan to obtain medical records from [facility] .Faxed release to them . Will continue Cogentin 1 mg po bid for EPS. Will continue Trazodone 50 mg po qhs prn for sleep.   Will continue to monitor vitals ,medication compliance and treatment side effects while patient is here.  Will monitor for medical issues as well as call consult as needed.  Reviewed labs ,lipid panel ,Hba1c - wnl.TSH pending,will await pending lab results. CSW will start working on disposition.  Patient to participate in therapeutic milieu .       Medical Decision Making Problem Points:  Established problem, stable/improving (1), Review of last therapy session (1) and Review of psycho-social stressors (1) Data Points:  Decision to obtain old records (1) Independent review of image, tracing, or specimen (2) Order Aims Assessment (2) Review or order clinical lab tests (1) Review and summation of old records (2) Review of medication regiment & side effects (2) Review of new medications or change in dosage (2)  I certify that inpatient services furnished can reasonably be expected to improve the patient's condition.   Tekila Caillouet MD 06/04/2014, 12:08 PM

## 2014-06-05 DIAGNOSIS — F203 Undifferentiated schizophrenia: Secondary | ICD-10-CM | POA: Insufficient documentation

## 2014-06-05 MED ORDER — BENZTROPINE MESYLATE 1 MG PO TABS: 1.0000 mg | ORAL_TABLET | Freq: Two times a day (BID) | ORAL | Status: DC

## 2014-06-05 MED ORDER — TRAZODONE HCL 50 MG PO TABS: 50.0000 mg | ORAL_TABLET | Freq: Every day | ORAL | Status: DC

## 2014-06-05 MED ORDER — RISPERIDONE 3 MG PO TABS: 3.0000 mg | ORAL_TABLET | Freq: Two times a day (BID) | ORAL | Status: DC

## 2014-06-05 MED ORDER — TRAZODONE HCL 50 MG PO TABS
50.0000 mg | ORAL_TABLET | Freq: Every day | ORAL | Status: DC
  Filled 2014-06-05: qty 14
  Filled 2014-06-05: qty 1

## 2014-06-05 NOTE — Progress Notes (Signed)
Did not attend group 

## 2014-06-05 NOTE — Discharge Summary (Signed)
Physician Discharge Summary Note  Patient:  Carlos [name] is an 23 y.o., male MRN:  [id number] DOB:  01/30/1991 Patient phone:  [phone] (home)  Patient address:   [address] [place] [address],  Total Time spent with patient: 30 minutes  Date of Admission:  06/02/2014 Date of Discharge: 06/05/14  Reason for Admission:  Acute psychosis   Discharge Diagnoses: Active Problems:   Psychotic disorder   Psychoses   Cannabis use disorder, moderate, dependence   Tobacco use disorder   Undifferentiated schizophrenia  Psychiatric Specialty Exam: Physical Exam  Psychiatric: He has a normal mood and affect. His speech is normal and behavior is normal. Judgment and thought content normal. Cognition and memory are normal.    Review of Systems  Constitutional: Negative.   HENT: Negative.   Eyes: Negative.   Respiratory: Negative.   Cardiovascular: Negative.   Gastrointestinal: Negative.   Genitourinary: Negative.   Musculoskeletal: Negative.   Skin: Negative.   Neurological: Negative.   Endo/Heme/Allergies: Negative.   Psychiatric/Behavioral: Positive for hallucinations (Stabilized with treatment ).    Blood pressure 115/89, pulse 76, temperature 97.7 F (36.5 C), temperature source Oral, resp. rate 20, height 6' (1.829 m), weight 82.101 kg (181 lb).Body mass index is 24.54 kg/(m^2).  See Physician SRA                                                  Past Psychiatric History: See H&P Diagnosis:  Hospitalizations:  Outpatient Care:  Substance Abuse Care:  Self-Mutilation:  Suicidal Attempts:  Violent Behaviors:   Musculoskeletal: Strength & Muscle Tone: within normal limits Gait & Station: normal Patient leans: N/A  DSM5:  Primary Psychiatric Diagnosis: Schizophrenia ,multiple episodes ,currently in acute episode (acute phase resolved)  Secondary Psychiatric Diagnosis: Cannabis use disorder ,moderate Tobacco use  disorder,moderate  Non Psychiatric Diagnosis: See PMH  Level of Care:  OP  Hospital Course:  Carlos [name] is an 23 y.o. male [facility] after being petitioned by his Solicitorcommanding officer. Pt reported that he does not know why he is at the ED and informed this Clinical research associatewriter that she should ask the [employer] and Companymilitary and the police. Pt denies SI, HI and AVH at this time. Pt did not report any previous suicide attempts or psychiatric hospitalizations; however it has been documented that [facility]. When pt was asked about his diagnosis pt stated "I can see, I can hear, I am perfectly fine". Pt did not endorse any depressive symptoms nor did he share any issues with his appetite or sleep. When pt was asked about assess to weapons pt stated "I got the world that's all I need". Pt denied any alcohol or illicit substance use but his UDS is positive for THC. PT reported that he has been prescribed medications in the past but stated "I didn't know I needed them, I am a better solider than all of them". Pt is alert and oriented x3. Pt is calm and cooperative at this time. Pt avoided eye contact throughout this assessment. Pt speech is logical and coherent. Pt thought process is circumstantial; pt often talked about how much better the world be if Jay-Z was the president. Pt stated "Jay-Z should be the president maybe this will move faster". Pt mood is euthymic and his affect is congruent with his mood. Pt  reported that he is currently homeless; however he can count on his brothers for support. 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 It has also been reported that pt stated that Edem Tiegs was going to get what was coming to him with the same gun as well as [facility] getting what's coming to them. He reported that pt has  sporadic train of thought. Pt was recently hospitalized at [facility] for 5 days due to psychosis. He reported that pt does not have any insurance or money and has been off of his medication since being discharged from the hospital.          Carlos [name] was admitted to the adult unit. He was evaluated and his symptoms were identified. Medication management was discussed and initiated. Patient was restarted on his home medication sof Cogentin 1 mg BID for EPS prevention and Risperdal 3 mg BID for psychosis. He was oriented to the unit and encouraged to participate in unit programming. Medical problems were identified and treated appropriately. Home medication was restarted as needed.        The patient was evaluated each day by a clinical provider to ascertain the patient's response to treatment.  Improvement was noted by the patient's report of decreasing symptoms, improved sleep and appetite, affect, medication tolerance, behavior, and participation in unit programming.  He was asked each day to complete a self inventory noting mood, mental status, pain, new symptoms, anxiety and concerns.         He responded well to medication and being in a therapeutic and supportive environment. There was not episodes of agitation noted by the nursing staff. His thoughts were also noted to be more organized and no further paranoid comments were noted. Positive and appropriate behavior was noted and the patient was motivated for recovery.  The patient worked closely with the treatment team and case manager to develop a discharge plan with appropriate goals. Coping skills, problem solving as well as relaxation therapies were also part of the unit programming.         By the day of discharge he was in much improved condition than upon admission.  Symptoms were reported as significantly decreased or resolved completely.  The patient denied SI/HI and voiced no AVH. He was motivated to continue taking medication with a goal of  continued improvement in mental health.  Carlos [name] was discharged home with a plan to follow up as noted below. Patient was provided with prescriptions and sample medications. He left [facility] in no acute distress with all belongings returned to him.   Consults:  psychiatry  Significant Diagnostic Studies:  Chemistry panel, Lipid profile, CBC, Hemoglobin A1C, UDS positive for marijuana   Discharge Vitals:   Blood pressure 115/89, pulse 76, temperature 97.7 F (36.5 C), temperature source Oral, resp. rate 20, height 6' (1.829 m), weight 82.101 kg (181 lb). Body mass index is 24.54 kg/(m^2). Lab Results:   Results for orders placed or performed during the hospital encounter of 06/02/14 (from the past 72 hour(s))  TSH     Status: None   Collection Time: 06/04/14  6:25 AM  Result Value Ref Range   TSH 3.930 0.350 - 4.500 uIU/mL    Comment: Performed at [facility]  Lipid panel     Status: Abnormal   Collection Time: 06/04/14  6:25 AM  Result Value Ref Range   Cholesterol 191 0 - 200 mg/dL   Triglycerides 49 <086<150 mg/dL   HDL 67 >57>39 mg/dL   Total CHOL/HDL Ratio 2.9 RATIO   VLDL 10 0 - 40 mg/dL   LDL Cholesterol 846114 (H) 0 - 99 mg/dL    Comment:        Total Cholesterol/HDL:CHD Risk Coronary Heart Disease Risk Table                     Men   Women  1/2 Average Risk   3.4   3.3  Average Risk       5.0   4.4  2 X Average Risk   9.6   7.1  3 X Average Risk  23.4   11.0        Use the calculated Patient Ratio above and the CHD Risk Table to determine the patient's CHD Risk.        ATP III CLASSIFICATION (LDL):  <100     mg/dL   Optimal  [id number]  mg/dL   Near or Above                    Optimal  130-159  mg/dL   Borderline  841-324160-189  mg/dL   High  >401>190     mg/dL   Very High Performed at [facility]   Hemoglobin A1c     Status: None   Collection Time: 06/04/14  6:25 AM  Result Value Ref Range   Hgb A1c MFr Bld 5.5 <5.7 %    Comment: (NOTE)                                                                        According to the ADA Clinical Practice Recommendations for 2011, when HbA1c is used as a screening test:  >=6.5%   Diagnostic of Diabetes Mellitus           (if abnormal result is confirmed) 5.7-6.4%   Increased risk of developing Diabetes Mellitus References:Diagnosis and Classification of Diabetes Mellitus,Diabetes Care,2011,34(Suppl 1):S62-S69 and Standards of Medical Care in         Diabetes - 2011,Diabetes Care,2011,34 (Suppl 1):S11-S61.    Mean Plasma Glucose 111 <117 mg/dL    Comment: Performed at [facility]    Physical Findings: AIMS: Facial and Oral Movements Muscles of Facial Expression: None, normal Lips and Perioral Area: None, normal Jaw: None, normal Tongue: None, normal,Extremity Movements Upper (arms, wrists, hands, fingers): None, normal Lower (legs, knees, ankles, toes): None, normal, Trunk Movements Neck, shoulders, hips: None, normal, Overall Severity Severity of abnormal movements (highest score from questions above): None, normal Incapacitation due to abnormal movements: None, normal Patient's awareness of abnormal movements (rate only patient's report): No Awareness, Dental Status Current problems with teeth and/or dentures?: No Does patient usually wear dentures?: No  CIWA:  CIWA-Ar Total: 0 COWS:  COWS Total Score: 0  Psychiatric Specialty Exam: See Psychiatric Specialty Exam and Suicide Risk Assessment completed by Attending Physician prior to discharge.  Discharge destination:  Home  Is patient on multiple antipsychotic therapies at discharge:  No   Has Patient had three or more failed trials of antipsychotic monotherapy by  history:  No  Recommended Plan for Multiple Antipsychotic Therapies: NA     Medication List    TAKE these medications      Indication   benztropine 1 MG tablet  Commonly known as:  COGENTIN  Take 1 tablet (1 mg total) by mouth 2 (two) times daily.   Indication:   Extrapyramidal Reaction caused by Medications     risperiDONE 3 MG tablet  Commonly known as:  RISPERDAL  Take 1 tablet (3 mg total) by mouth 2 (two) times daily.   Indication:  Psychosis     traZODone 50 MG tablet  Commonly known as:  DESYREL  Take 1 tablet (50 mg total) by mouth at bedtime.   Indication:  Trouble Sleeping           Follow-up Information    Follow up with [facility].   Why:  Walk in between 8am-9am Monday through Friday for hospital follow-up/medication management.    Contact information:   [address], [place] [address] Phone: [phone] Fax: [phone]     Follow-up recommendations:   Activity: No restrictions Diet: regular  Comments:   Take all your medications as prescribed by your mental healthcare provider.  Report any adverse effects and or reactions from your medicines to your outpatient provider promptly.  Patient is instructed and cautioned to not engage in alcohol and or illegal drug use while on prescription medicines.  In the event of worsening symptoms, patient is instructed to call the crisis hotline, 911 and or go to the nearest ED for appropriate evaluation and treatment of symptoms.  Follow-up with your primary care provider for your other medical issues, concerns and or health care needs.   Total Discharge Time:  Greater than 30 minutes.  SignedFransisca Kaufmann NP-C 06/05/2014, 9:40 AM

## 2014-06-05 NOTE — Progress Notes (Signed)
Patient ID: Carlos [name], male   DOB: 12/19/1990, 23 y.o.   MRN: [id number]  Pt. Denies SI/HI and A/V hallucinations to this Clinical research associatewriter. Belongings returned to patient at time of discharge. Patient denies any pain or discomfort. Discharge instructions and medications were reviewed with patient. Patient verbalized understanding of both medications and discharge instructions. Q15 minute safety checks until discharge. No distress upon discharge. Patient reports that [facility] and it was helpful to him. Patient reports that we were thorough with his care and he is grateful.

## 2014-06-05 NOTE — [facility] Suicide Risk Assessment (Signed)
[facility] INPATIENT:  Family/Significant Other Suicide Prevention Education  Suicide Prevention Education:  Contact Attempts: Carlos [name] (pt's brother) [phone] has been identified by the patient as the family member/significant other with whom the patient will be residing, and identified as the person(s) who will aid the patient in the event of a mental health crisis.  With written consent from the patient, two attempts were made to provide suicide prevention education, prior to and/or following the patient's discharge.  We were unsuccessful in providing suicide prevention education.  A suicide education pamphlet was given to the patient to share with family/significant other.  Date and time of first attempt: 06/03/14 3:00PM  (voicemail left)  Date and time of second attempt: 06/05/14 10:0)AM  (2nd voicemail left)   Smart, Layloni Fahrner LCSWA  06/05/2014, 10:05 AM

## 2014-06-05 NOTE — Progress Notes (Signed)
[facility] Adult Case Management Discharge Plan :  Will you be returning to the same living situation after discharge: Yes,  home At discharge, do you have transportation home?:Yes,  bus pass Do you have the ability to pay for your medications:Yes,  mental health  Release of information consent forms completed and submitted to Medical Records by CSW.  Patient to Follow up at: Follow-up Information    Follow up with [facility].   Why:  Walk in between 8am-9am Monday through Friday for hospital follow-up/medication management.    Contact information:   [address]  [place], [place] [address] Phone: [phone] Fax: [phone]      Patient denies SI/HI:   Yes,  during group/self report.    Safety Planning and Suicide Prevention discussed:  Yes,  Contact attempts made with pt's brother. SPI pamphlet provided to pt and SPE completed with him. Pt was encouraged to share this information with support network, ask questions, and talk about any concerns relating to SPE.  Smart, Jairy Angulo LCSWA  06/05/2014, 10:06 AM

## 2014-06-05 NOTE — [facility] Suicide Risk Assessment (Signed)
   Demographic Factors:  Male, Low socioeconomic status and Unemployed  Total Time spent with patient: 45 minutes  Psychiatric Specialty Exam: Physical Exam  ROS  Blood pressure 115/89, pulse 76, temperature 97.7 F (36.5 C), temperature source Oral, resp. rate 20, height 6' (1.829 m), weight 82.101 kg (181 lb).Body mass index is 24.54 kg/(m^2).  General Appearance: Casual  Eye Contact::  Fair  Speech:  Clear and Coherent  Volume:  Normal  Mood:  Euthymic  Affect:  Appropriate  Thought Process:  Coherent  Orientation:  Full (Time, Place, and Person)  Thought Content:  WDL  Suicidal Thoughts:  No  Homicidal Thoughts:  No  Memory:  Immediate;   Fair Recent;   Fair Remote;   Fair  Judgement:  Fair  Insight:  Fair  Psychomotor Activity:  Normal  Concentration:  Good  Recall:  FiservFair  Fund of Knowledge:Fair  Language: Good  Akathisia:  No  Handed:  Right  AIMS (if indicated):     Assets:  Communication Skills Desire for Improvement Physical Health  Sleep:  Number of Hours: 6.75    Musculoskeletal: Strength & Muscle Tone: within normal limits Gait & Station: normal Patient leans: N/A   Mental Status Per Nursing Assessment::   On Admission:     Current Mental Status by Physician: Pt denies SI/HI/AH/VH,pt is more goal directed ,less disorganized ,pleasant .  Loss Factors: Decrease in vocational status  Historical Factors: Impulsivity  Risk Reduction Factors:   Religious beliefs about death and Positive therapeutic relationship  Continued Clinical Symptoms:  Alcohol/Substance Abuse/Dependencies  Cognitive Features That Contribute To Risk:  Polarized thinking    Suicide Risk:  Minimal: No identifiable suicidal ideation.   Discharge Diagnoses:  Diagnosis:  DSM5: Primary Psychiatric Diagnosis: Schizophrenia ,multiple episodes ,currently in acute episode (acute phase resolved)   Secondary Psychiatric Diagnosis: Cannabis use disorder ,moderate Tobacco  use disorder,moderate  Non Psychiatric Diagnosis: See PMH     Past Medical History  Diagnosis Date  . Psychotic disorder     Plan Of Care/Follow-up recommendations:  Activity:  No restrictions Diet:  regular  Is patient on multiple antipsychotic therapies at discharge:  No   Has Patient had three or more failed trials of antipsychotic monotherapy by history:  No  Recommended Plan for Multiple Antipsychotic Therapies: NA    Toluwanimi Radebaugh MD 06/05/2014, 9:35 AM

## 2014-06-10 NOTE — Progress Notes (Signed)
Patient Discharge Instructions:  After Visit Summary (AVS):   Faxed to:  06/10/14 Discharge Summary Note:   Faxed to:  06/10/14 Psychiatric Admission Assessment Note:   Faxed to:  06/10/14 Suicide Risk Assessment - Discharge Assessment:   Faxed to:  06/10/14 Faxed/Sent to the Next Level Care provider:  06/10/14 [facility] @ [phone]  Jerelene ReddenSheena E [place], 06/10/2014, 3:19 PM

## 2015-08-02 ENCOUNTER — Encounter (HOSPITAL_COMMUNITY): Payer: Self-pay | Admitting: Oncology

## 2015-08-02 ENCOUNTER — Emergency Department (HOSPITAL_COMMUNITY)
Admission: EM | Admit: 2015-08-02 | Discharge: 2015-08-05 | Payer: Medicaid Other | Attending: Emergency Medicine | Admitting: Emergency Medicine

## 2015-08-02 DIAGNOSIS — F209 Schizophrenia, unspecified: Secondary | ICD-10-CM | POA: Insufficient documentation

## 2015-08-02 DIAGNOSIS — R451 Restlessness and agitation: Secondary | ICD-10-CM | POA: Insufficient documentation

## 2015-08-02 DIAGNOSIS — F17210 Nicotine dependence, cigarettes, uncomplicated: Secondary | ICD-10-CM | POA: Insufficient documentation

## 2015-08-02 DIAGNOSIS — F203 Undifferentiated schizophrenia: Secondary | ICD-10-CM | POA: Diagnosis present

## 2015-08-02 DIAGNOSIS — R45850 Homicidal ideations: Secondary | ICD-10-CM | POA: Insufficient documentation

## 2015-08-02 DIAGNOSIS — F4310 Post-traumatic stress disorder, unspecified: Secondary | ICD-10-CM | POA: Diagnosis present

## 2015-08-02 DIAGNOSIS — F919 Conduct disorder, unspecified: Secondary | ICD-10-CM | POA: Insufficient documentation

## 2015-08-02 DIAGNOSIS — F1210 Cannabis abuse, uncomplicated: Secondary | ICD-10-CM | POA: Insufficient documentation

## 2015-08-02 DIAGNOSIS — Z79899 Other long term (current) drug therapy: Secondary | ICD-10-CM | POA: Insufficient documentation

## 2015-08-02 LAB — COMPREHENSIVE METABOLIC PANEL
ALBUMIN: 4.2 g/dL (ref 3.5–5.0)
ALT: 22 U/L (ref 17–63)
ANION GAP: 8 (ref 5–15)
AST: 31 U/L (ref 15–41)
Alkaline Phosphatase: 48 U/L (ref 38–126)
BUN: 8 mg/dL (ref 6–20)
CO2: 26 mmol/L (ref 22–32)
Calcium: 9.1 mg/dL (ref 8.9–10.3)
Chloride: 105 mmol/L (ref 101–111)
Creatinine, Ser: 1.09 mg/dL (ref 0.61–1.24)
GFR calc non Af Amer: >60 mL/min (ref >60)
GLUCOSE: 126 mg/dL — AB (ref 65–99)
Potassium: 3.5 mmol/L (ref 3.5–5.1)
Sodium: 139 mmol/L (ref 135–145)
Total Bilirubin: 1.3 mg/dL — ABNORMAL HIGH (ref 0.3–1.2)
Total Protein: 6.9 g/dL (ref 6.5–8.1)

## 2015-08-02 LAB — SALICYLATE LEVEL

## 2015-08-02 LAB — ETHANOL: Alcohol, Ethyl (B): <5 mg/dL (ref <5)

## 2015-08-02 LAB — RAPID URINE DRUG SCREEN, HOSP PERFORMED
AMPHETAMINES: NOT DETECTED
Barbiturates: NOT DETECTED
Benzodiazepines: NOT DETECTED
COCAINE: NOT DETECTED
OPIATES: NOT DETECTED
TETRAHYDROCANNABINOL: POSITIVE — AB

## 2015-08-02 LAB — CBC
HCT: 44.7 % (ref 39.0–52.0)
HEMOGLOBIN: 15.6 g/dL (ref 13.0–17.0)
MCH: 30.5 pg (ref 26.0–34.0)
MCHC: 34.9 g/dL (ref 30.0–36.0)
MCV: 87.5 fL (ref 78.0–100.0)
Platelets: 260 10*3/uL (ref 150–400)
RBC: 5.11 MIL/uL (ref 4.22–5.81)
RDW: 13.5 % (ref 11.5–15.5)
WBC: 7.7 10*3/uL (ref 4.0–10.5)

## 2015-08-02 LAB — ACETAMINOPHEN LEVEL

## 2015-08-02 MED ORDER — ZIPRASIDONE MESYLATE 20 MG IM SOLR
10.0000 mg | Freq: Once | INTRAMUSCULAR | Status: AC | PRN
  Administered 2015-08-02: 10 mg via INTRAMUSCULAR
  Filled 2015-08-02: qty 20

## 2015-08-02 MED ORDER — AMANTADINE HCL 100 MG PO CAPS
100.0000 mg | ORAL_CAPSULE | Freq: Every day | ORAL | Status: DC
  Administered 2015-08-04: 100 mg via ORAL
  Filled 2015-08-02 (×4): qty 1

## 2015-08-02 MED ORDER — NICOTINE 21 MG/24HR TD PT24
21.0000 mg | MEDICATED_PATCH | Freq: Every day | TRANSDERMAL | Status: DC
  Filled 2015-08-02 (×2): qty 1

## 2015-08-02 MED ORDER — HALOPERIDOL LACTATE 5 MG/ML IJ SOLN
5.0000 mg | Freq: Once | INTRAMUSCULAR | Status: AC
  Administered 2015-08-02: 5 mg via INTRAMUSCULAR
  Filled 2015-08-02: qty 1

## 2015-08-02 MED ORDER — LORAZEPAM 1 MG PO TABS
1.0000 mg | ORAL_TABLET | Freq: Three times a day (TID) | ORAL | Status: DC | PRN
  Filled 2015-08-02 (×2): qty 1

## 2015-08-02 MED ORDER — LORAZEPAM 2 MG/ML IJ SOLN
1.0000 mg | Freq: Once | INTRAMUSCULAR | Status: AC
  Administered 2015-08-02: 1 mg via INTRAMUSCULAR
  Filled 2015-08-02: qty 1

## 2015-08-02 MED ORDER — STERILE WATER FOR INJECTION IJ SOLN
INTRAMUSCULAR | Status: AC
  Administered 2015-08-02: 10:00:00
  Filled 2015-08-02: qty 10

## 2015-08-02 MED ORDER — HYDROXYZINE HCL 25 MG PO TABS
50.0000 mg | ORAL_TABLET | Freq: Three times a day (TID) | ORAL | Status: DC | PRN
  Filled 2015-08-02: qty 2

## 2015-08-02 MED ORDER — ONDANSETRON HCL 4 MG PO TABS: 4.0000 mg | ORAL_TABLET | Freq: Three times a day (TID) | ORAL | Status: DC | PRN

## 2015-08-02 MED ORDER — ACETAMINOPHEN 325 MG PO TABS: 650.0000 mg | ORAL_TABLET | ORAL | Status: DC | PRN

## 2015-08-02 MED ORDER — LORAZEPAM 1 MG PO TABS: 1.0000 mg | ORAL_TABLET | Freq: Once | ORAL | Status: AC

## 2015-08-02 MED ORDER — ZOLPIDEM TARTRATE 5 MG PO TABS: 5.0000 mg | ORAL_TABLET | Freq: Every evening | ORAL | Status: DC | PRN

## 2015-08-02 MED ORDER — RISPERIDONE 1 MG PO TBDP
1.0000 mg | ORAL_TABLET | Freq: Two times a day (BID) | ORAL | Status: DC
  Administered 2015-08-03 – 2015-08-05 (×4): 1 mg via ORAL
  Filled 2015-08-02 (×7): qty 1

## 2015-08-02 MED ORDER — TRAZODONE HCL 50 MG PO TABS
50.0000 mg | ORAL_TABLET | Freq: Every day | ORAL | Status: DC
  Administered 2015-08-03 – 2015-08-04 (×2): 50 mg via ORAL
  Filled 2015-08-02 (×2): qty 1

## 2015-08-02 NOTE — ED Notes (Addendum)
Up in hall pacing, talking, cursing at times, NP aware give risperidol now

## 2015-08-02 NOTE — ED Notes (Signed)
Respirations regular and unlabored.

## 2015-08-02 NOTE — ED Notes (Signed)
Pt brought in by [employer] for [facility]H.  Pt denies SI/HI  Per [employer] 2 nights ago a male called saying pt was endorsing SI.  Pt's brother is taking out IVC papers on pt now.

## 2015-08-02 NOTE — ED Notes (Signed)
Pt. Noted sleeping in room. No complaints or concerns voiced. No distress or abnormal behavior noted. Will continue to monitor with security cameras. Q 15 minute rounds continue. 

## 2015-08-02 NOTE — ED Notes (Signed)
Pt. Noted walking in hall. No complaints or concerns voiced. No distress or abnormal behavior noted. Will continue to monitor with security cameras. Q 15 minute rounds continue.  

## 2015-08-02 NOTE — ED Notes (Signed)
Pt. To [facility] from ED ambulatory without difficulty, to room 36 . [facility]. Pt. Is alert and oriented, warm and dry in no distress. Pt. Refuses to answer questions and states "I don't like hospitals and I want to leave".Pt. Made aware of security cameras and Q15 minute rounds. Pt. Encouraged to let Nursing staff know of any concerns or needs.

## 2015-08-02 NOTE — ED Notes (Addendum)
Pt medicated w/ out difficulty.  Pt talking about knowing "both Bushes.Marland Kitchen.Marland Kitchen.Moses..." and the "tracking device" that is implanted in his hip and pointed to a mole.

## 2015-08-02 NOTE — ED Notes (Signed)
Pt back to his room to talk w/ dr Rosezella Rumpfcobbos and Julieanne CottonJosephine NP

## 2015-08-02 NOTE — ED Notes (Signed)
In the dayroom, calm, cooperative

## 2015-08-02 NOTE — ED Notes (Addendum)
Up in hall, talking, laughing, pacing, difficult to redirect

## 2015-08-02 NOTE — ED Notes (Signed)
"  don't need no help from nobody...don't need the hospital..." pt continues to decline medication, increasingly difficult to redirect.

## 2015-08-02 NOTE — ED Notes (Signed)
While speaking to pt the only discernable part of our conversation was pt's sadness about the passing of his mother five years ago.  The rest of conversation was incomprehensible.

## 2015-08-02 NOTE — ED Notes (Signed)
Report received from Lizbeth BarkJanie Rambo RN. Pt. sleeping. Will continue to monitor for safety via security cameras and Q 15 minute checks.

## 2015-08-02 NOTE — ED Notes (Signed)
Pt declining to take medications by mouth.

## 2015-08-02 NOTE — BH Assessment (Signed)
Tele Assessment Note   Carlos Carlos [name] is an 24 y.[facility]. [facility]. 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 When pt was asked about hallucinations pt began moving his lips with no sounds coming out. Pt appears to be responding to internal stimuli. Pt denied any previous suicide attempts. Pt has been hospitalized several times in the past. Pt reported that his sleep is poor and stated "I can't sleep; it'Carlos [name] the worst thing between Valentine'Carlos [name] Day and October 10th."Pt reported that his appetite is beyond well. Pt reported having access to weapons and stated "yeah all I got is missiles." During the assessment pt begin staring at the door and mouthing incoherent words while tapping his thumb and index finger together.  Inpatient treatment is recommended.  Collateral information was gathered from the petitioner who reported that while pt was speaking to his ex-gf he began displaying erratic behaviors. He reported that pt was babbling and making statements like he wanted to kill himself and end it all. He reported that pt has been making hand gestures and moving things randomly. Pt'Carlos [name] brother stated "he'Carlos [name] battling something mentally". He shared [facility]; [name], Marijuana, [name], and IV). He  reports that he does not drink alcohol.  Additional Social History:  Alcohol / Drug Use History of alcohol / drug use?: Yes Substance #1 Name of Substance 1: THC  1 - Age of First Use: unknown  1 - Amount (size/oz): unknown  1 - Frequency: unknown  1 - Duration: unknown  1 - Last Use / Amount: unknown   CIWA: CIWA-Ar BP: 143/93 mmHg Pulse Rate: 97 COWS:    PATIENT STRENGTHS: (choose at least two) Average or above average intelligence Supportive family/friends  Allergies: No Known Allergies  Home Medications:  (Not in a hospital admission)  OB/GYN Status:  No LMP for male patient.  General Assessment Data Location of Assessment: [facility] ED TTS Assessment: In system Is this a Tele or Face-to-Face Assessment?: Face-to-Face Is this an Initial Assessment or a Re-assessment for this encounter?: Initial Assessment Marital status: Single Living Arrangements: Other relatives (Brother ) Can pt return to current living arrangement?: Yes Admission Status: Involuntary Is patient capable of signing voluntary admission?: Yes Referral Source: Self/Family/Friend Insurance type: Medicaid      Crisis Care Plan Living Arrangements: Other relatives (Brother ) Name of Psychiatrist: None  Name of Therapist: None  Education Status Is patient currently in school?: No Current Grade: N/A Highest grade of school patient has completed: N/A Name of school: N//A Contact person: N/A  Risk to self with the past 6 months Suicidal Ideation: No Has patient been a risk to self within the past 6 months prior to admission? : No Suicidal Intent: No Has patient had any suicidal intent within the  past 6 months prior to admission? : No Is patient at risk for suicide?: No Suicidal Plan?: No Has patient had any suicidal plan within the past 6 months prior to admission? : No Access to Means: No What has been your use of drugs/alcohol within the last 12 months?: THC  Previous Attempts/Gestures: No How  many times?: 0 Other Self Harm Risks: Denies  Triggers for Past Attempts: None known Intentional Self Injurious Behavior: None Family Suicide History: No Recent stressful life event(Carlos [name]):  (Pt denies ) Persecutory voices/beliefs?: No Depression:  (Pt denies ) Depression Symptoms:  (Pt denies ) Substance abuse history and/or treatment for substance abuse?: Yes Suicide prevention information given to non-admitted patients: Not applicable  Risk to Others within the past 6 months Homicidal Ideation: No Does patient have any lifetime risk of violence toward others beyond the six months prior to admission? : No Thoughts of Harm to Others: No Current Homicidal Intent: No Current Homicidal Plan: No Access to Homicidal Means: No Identified Victim: N/A History of harm to others?: No Assessment of Violence: None Noted Violent Behavior Description: None noted. Pt is calm and cooperative at this time.  Does patient have access to weapons?: Yes (Comment) ("Just missiles") Criminal Charges Pending?: No Does patient have a court date: No Is patient on probation?: No  Psychosis Hallucinations: Auditory (Pt appears to be responding to internal stimuli ) Delusions: None noted  Mental Status Report Appearance/Hygiene: Layered clothes Eye Contact: Poor Motor Activity: Unremarkable Speech: Incoherent Level of Consciousness: Alert Mood: Preoccupied Affect: Preoccupied Anxiety Level: Moderate Thought Processes: Circumstantial, Irrelevant Judgement: Impaired Orientation: Person Obsessive Compulsive Thoughts/Behaviors: None  Cognitive Functioning Concentration: Decreased Memory: Recent Intact IQ: Average Insight: Poor Impulse Control: Poor Appetite: Good ("beyond well") Weight Loss: 0 Weight Gain: 0 Sleep:  ("I can't sleep") Total Hours of Sleep:  (Unable to assess ) Vegetative Symptoms: Unable to Assess  ADLScreening [facility] Assessment Services) Patient'Carlos [name] cognitive ability adequate to  safely complete daily activities?: Yes Patient able to express need for assistance with ADLs?: Yes Independently performs ADLs?: Yes (appropriate for developmental age)  Prior Inpatient Therapy Prior Inpatient Therapy: Yes Prior Therapy Dates: 2014, 2015 Prior Therapy Facilty/Provider(Carlos [name]): [facility] Reason for Treatment: Psychosis  Prior Outpatient Therapy Prior Outpatient Therapy: No Prior Therapy Dates: N/A Prior Therapy Facilty/Provider(Carlos [name]): N/A Reason for Treatment: N/A Does patient have an ACCT team?: No Does patient have Intensive In-House Services?  : No Does patient have [facility] services? : No Does patient have P4CC services?: No  ADL Screening (condition at time of admission) Patient'Carlos [name] cognitive ability adequate to safely complete daily activities?: Yes Patient able to express need for assistance with ADLs?: Yes Independently performs ADLs?: Yes (appropriate for developmental age)       Abuse/Neglect Assessment (Assessment to be complete while patient is alone) Physical Abuse: Denies Verbal Abuse: Denies Sexual Abuse: Denies Exploitation of patient/patient'Carlos [name] resources: Denies Self-Neglect: Denies     Merchant navy officer (For Healthcare) Does patient have an advance directive?:  (UTA)    Additional Information 1:1 In Past 12 Months?: No CIRT Risk: No Elopement Risk: No Does patient have medical clearance?: Yes     Disposition:  Disposition Initial Assessment Completed for this Encounter: Yes Disposition of Patient: Inpatient treatment program Type of inpatient treatment program: Adult  Carlos Carlos [name] 08/02/2015 3:22 AM

## 2015-08-02 NOTE — ED Notes (Signed)
Pt declined breakfast, up in hall "pacing"  Pt declined to answer questions and reports that he has to keep pacing.  Pt talking, laughing and cursing. Declined to take ativan "I don't need medicine.. need to pace..." Pt also declined apple juice reporting that there is medicine in it.

## 2015-08-02 NOTE — ED Notes (Signed)
NP aware that pt will not take PO meds.  Pt up in hall pacing, talking, laughing at times, unable to redirect.

## 2015-08-02 NOTE — ED Provider Notes (Signed)
CSN: [id number]     Arrival date & time 08/02/15  0130 History   First MD Initiated Contact with Patient 08/02/15 0134     Chief Complaint  Patient presents with  . Medical Clearance    AVH [employer] working on [employer]     (Consider location/radiation/quality/duration/timing/severity/associated sxs/prior Treatment) HPI Comments: 24 year old male with a history of psychotic disorder presents to the emergency department under IVC taken out by patient's brother. Per [employer], a male called the police 2 nights ago saying the patient was endorsing suicidal ideations. Patient denies any suicidal ideations at this time. He does express homicidal thoughts, but denies specific thoughts towards a certain individual. He states that he misses his big brother and he is requesting to see his cousin, though his cousin lives in [place]. He states that he is "fed up with my family and folks". Patient appears to be looking around the room. He is very distractible. He states that he is "looking for one of my pals". He denies taking any medications on a daily basis.  The history is provided by the patient. No language interpreter was used.    Past Medical History  Diagnosis Date  . Psychotic disorder    History reviewed. No pertinent past surgical history. History reviewed. No pertinent family history. Social History  Substance Use Topics  . Smoking status: Current Every Day Smoker -- 3.00 packs/day    Types: Cigarettes  . Smokeless tobacco: None  . Alcohol Use: No    Review of Systems  Psychiatric/Behavioral: Positive for hallucinations, behavioral problems and agitation.  All other systems reviewed and are negative.   Allergies  Review of patient's allergies indicates no known allergies.  Home Medications   Prior to Admission medications   Medication Sig Start Date End Date Taking? Authorizing Provider  benztropine (COGENTIN) 1 MG tablet Take 1 tablet (1 mg total) by mouth 2 (two) times daily. 06/05/14    Thermon LeylandLaura A Davis, NP  risperiDONE (RISPERDAL) 3 MG tablet Take 1 tablet (3 mg total) by mouth 2 (two) times daily. 06/05/14   Thermon LeylandLaura A Davis, NP  traZODone (DESYREL) 50 MG tablet Take 1 tablet (50 mg total) by mouth at bedtime. 06/05/14   Thermon LeylandLaura A Davis, NP   BP 143/93 mmHg  Pulse 97  Temp(Src) 98.3 F (36.8 C) (Oral)  Resp 20  SpO2 97%   Physical Exam  Constitutional: He is oriented to person, place, and time. He appears well-developed and well-nourished. No distress.  HENT:  Head: Normocephalic and atraumatic.  Eyes: Conjunctivae and EOM are normal. No scleral icterus.  Neck: Normal range of motion.  Pulmonary/Chest: Effort normal. No respiratory distress.  Musculoskeletal: Normal range of motion.  Neurological: He is alert and oriented to person, place, and time.  Skin: Skin is warm and dry. No rash noted. He is not diaphoretic. No erythema. No pallor.  Psychiatric: He has a normal mood and affect. His speech is normal. He is withdrawn and actively hallucinating. Cognition and memory are normal. He expresses homicidal ideation. He expresses no suicidal ideation. He expresses no suicidal plans and no homicidal plans.  Patient states he is "all vengeance and no mercy"  Nursing note and vitals reviewed.   ED Course  Procedures (including critical care time) Labs Review Labs Reviewed  COMPREHENSIVE METABOLIC PANEL - Abnormal; Notable for the following:    Glucose, Bld 126 (*)    Total Bilirubin 1.3 (*)    All other components within normal limits  ACETAMINOPHEN LEVEL -  Abnormal; Notable for the following:    Acetaminophen (Tylenol), Serum <10 (*)    All other components within normal limits  URINE RAPID DRUG SCREEN, HOSP PERFORMED - Abnormal; Notable for the following:    Tetrahydrocannabinol POSITIVE (*)    All other components within normal limits  ETHANOL  SALICYLATE LEVEL  CBC    Imaging Review No results found.   I have personally reviewed and evaluated these images and  lab results as part of my medical decision-making.   EKG Interpretation None      MDM   Final diagnoses:  Schizophrenia, unspecified type (HCC)    24 year old male presents under IVC. He has been medically cleared and is pending inpatient placement for further management of his schizophrenia. Psych hold orders placed.   Filed Vitals:   08/02/15 0159  BP: 143/93  Pulse: 97  Temp: 98.3 F (36.8 C)  TempSrc: Oral  Resp: 20  SpO2: 97%     Antony Madura, PA-C 08/02/15 0559  April Palumbo, MD 08/02/15 [phone]

## 2015-08-02 NOTE — ED Notes (Signed)
Josephine NP attempted to talk w/ pt, pt refused to talk and walked off

## 2015-08-02 NOTE — Consult Note (Signed)
[place] Psychiatry Consult   Reason for Consult: Agitation, aggression, Irritability. Referring Physician:  EDP Patient Identification: Carlos [name] MRN:  [id number] Principal Diagnosis: Undifferentiated schizophrenia [facility]) Diagnosis:   Patient Active Problem List   Diagnosis Date Noted  . Undifferentiated schizophrenia ([place]) [F20.3]     Priority: High  . Psychoses [F29]   . Cannabis use disorder, moderate, dependence ([place]) [F12.20]   . Tobacco use disorder [F17.200]   . Psychotic disorder [F29] 06/02/2014  . Psychosis [F29] 06/01/2014  . Homicidal ideations [R45.850] 06/01/2014  . Mood disorder ([place]) [F39] 06/01/2014    Total Time spent with patient: 45 minutes  Subjective:   Carlos [name] is a 24 y.o. male patient admitted with Agitation, aggression, Irritability.  HPI:  AA male, 24 years old was evaluated for agitation and aggression towards his family.  He was given Geodon injection earlier today for severe agitation, pacing and exhibiting anger towards anybody around him.  Patient woke up a little bit calmer but remains disorganized.  Patient stated that his twin brother brought him to the hospital against his wish for no reasons under IVC.  His speech was pressured and tangential.  He was loud and angry and stated that he needed to go out"there" and get a job.  Patient has been hospitalized for mental illness several times in the past.  He was last hospitalized here last year November at our [facility].  Patient denies SI/HI/AVH but appears to be responding to internal stimuli by mumbling at time.  He reports poor sleep.  Patient has been accepted for admission and we will be seeking placement at any hospital with available bed.  Past Psychiatric History:  Schizophrenia, Psychosis, Cannabis use disorder  Risk to Self: Suicidal Ideation: No Suicidal Intent: No Is patient at risk for suicide?: No Suicidal Plan?: No Access to Means: No What has been your use of drugs/alcohol  within the last 12 months?: THC  How many times?: 0 Other Self Harm Risks: Denies  Triggers for Past Attempts: None known Intentional Self Injurious Behavior: None Risk to Others: Homicidal Ideation: No Thoughts of Harm to Others: No Current Homicidal Intent: No Current Homicidal Plan: No Access to Homicidal Means: No Identified Victim: N/A History of harm to others?: No Assessment of Violence: None Noted Violent Behavior Description: None noted. Pt is calm and cooperative at this time.  Does patient have access to weapons?: Yes (Comment) ("Just missiles") Criminal Charges Pending?: No Does patient have a court date: No Prior Inpatient Therapy: Prior Inpatient Therapy: Yes Prior Therapy Dates: 2014, 2015 Prior Therapy Facilty/Provider(s): [facility] Reason for Treatment: Psychosis Prior Outpatient Therapy: Prior Outpatient Therapy: No Prior Therapy Dates: N/A Prior Therapy Facilty/Provider(s): N/A Reason for Treatment: N/A Does patient have an ACCT team?: No Does patient have Intensive In-House Services?  : No Does patient have [facility] services? : No Does patient have P4CC services?: No  Past Medical History:  Past Medical History  Diagnosis Date  . Psychotic disorder    History reviewed. No pertinent past surgical history. Family History: History reviewed. No pertinent family history. Family Psychiatric  History:   Unknown Social History:  History  Alcohol Use No     History  Drug Use  . Yes  . Special: Cocaine, Marijuana, [name], IV    Social History   Social History  . Marital Status: Single    Spouse Name: N/A  . Number of Children: N/A  . Years of Education: N/A   Social History Main Topics  .  Smoking status: Current Every Day Smoker -- 3.00 packs/day    Types: Cigarettes  . Smokeless tobacco: None  . Alcohol Use: No  . Drug Use: Yes    Special: Cocaine, Marijuana, [name], IV  . Sexual Activity: Yes    Birth Control/ Protection:  Condom   Other Topics Concern  . None   Social History Narrative   Additional Social History:    History of alcohol / drug use?: Yes Name of Substance 1: THC  1 - Age of First Use: unknown  1 - Amount (size/oz): unknown  1 - Frequency: unknown  1 - Duration: unknown  1 - Last Use / Amount: unknown                    Allergies:  No Known Allergies  Labs:  Results for orders placed or performed during the hospital encounter of 08/02/15 (from the past 48 hour(s))  Comprehensive metabolic panel     Status: Abnormal   Collection Time: 08/02/15  2:32 AM  Result Value Ref Range   Sodium 139 135 - 145 mmol/L   Potassium 3.5 3.5 - 5.1 mmol/L   Chloride 105 101 - 111 mmol/L   CO2 26 22 - 32 mmol/L   Glucose, Bld 126 (H) 65 - 99 mg/dL   BUN 8 6 - 20 mg/dL   Creatinine, Ser 1.09 0.61 - 1.24 mg/dL   Calcium 9.1 8.9 - 10.3 mg/dL   Total Protein 6.9 6.5 - 8.1 g/dL   Albumin 4.2 3.5 - 5.0 g/dL   AST 31 15 - 41 U/L   ALT 22 17 - 63 U/L   Alkaline Phosphatase 48 38 - 126 U/L   Total Bilirubin 1.3 (H) 0.3 - 1.2 mg/dL   GFR calc non Af Amer >60 >60 mL/min   GFR calc Af Amer >60 >60 mL/min    Comment: (NOTE) The eGFR has been calculated using the CKD EPI equation. This calculation has not been validated in all clinical situations. eGFR's persistently <60 mL/min signify possible Chronic Kidney Disease.    Anion gap 8 5 - 15  Ethanol (ETOH)     Status: None   Collection Time: 08/02/15  2:32 AM  Result Value Ref Range   Alcohol, Ethyl (B) <5 <5 mg/dL    Comment:        LOWEST DETECTABLE LIMIT FOR SERUM ALCOHOL IS 5 mg/dL FOR MEDICAL PURPOSES ONLY   Salicylate level     Status: None   Collection Time: 08/02/15  2:32 AM  Result Value Ref Range   Salicylate Lvl <8.9 2.8 - 30.0 mg/dL  Acetaminophen level     Status: Abnormal   Collection Time: 08/02/15  2:32 AM  Result Value Ref Range   Acetaminophen (Tylenol), Serum <10 (L) 10 - 30 ug/mL    Comment:        THERAPEUTIC  CONCENTRATIONS VARY SIGNIFICANTLY. A RANGE OF 10-30 ug/mL MAY BE AN EFFECTIVE CONCENTRATION FOR MANY PATIENTS. HOWEVER, SOME ARE BEST TREATED AT CONCENTRATIONS OUTSIDE THIS RANGE. ACETAMINOPHEN CONCENTRATIONS >150 ug/mL AT 4 HOURS AFTER INGESTION AND >50 ug/mL AT 12 HOURS AFTER INGESTION ARE OFTEN ASSOCIATED WITH TOXIC REACTIONS.   CBC     Status: None   Collection Time: 08/02/15  2:32 AM  Result Value Ref Range   WBC 7.7 4.0 - 10.5 K/uL   RBC 5.11 4.22 - 5.81 MIL/uL   Hemoglobin 15.6 13.0 - 17.0 g/dL   HCT 44.7 39.0 - 52.0 %  MCV 87.5 78.0 - 100.0 fL   MCH 30.5 26.0 - 34.0 pg   MCHC 34.9 30.0 - 36.0 g/dL   RDW 13.5 11.5 - 15.5 %   Platelets 260 150 - 400 K/uL  Urine rapid drug screen (hosp performed) (Not at [facility])     Status: Abnormal   Collection Time: 08/02/15  2:42 AM  Result Value Ref Range   Opiates NONE DETECTED NONE DETECTED   Cocaine NONE DETECTED NONE DETECTED   Benzodiazepines NONE DETECTED NONE DETECTED   Amphetamines NONE DETECTED NONE DETECTED   Tetrahydrocannabinol POSITIVE (A) NONE DETECTED   Barbiturates NONE DETECTED NONE DETECTED    Comment:        DRUG SCREEN FOR MEDICAL PURPOSES ONLY.  IF CONFIRMATION IS NEEDED FOR ANY PURPOSE, NOTIFY LAB WITHIN 5 DAYS.        LOWEST DETECTABLE LIMITS FOR URINE DRUG SCREEN Drug Class       Cutoff (ng/mL) Amphetamine      1000 Barbiturate      200 Benzodiazepine   102 Tricyclics       585 Opiates          300 Cocaine          300 THC              50     Current Facility-Administered Medications  Medication Dose Route Frequency Provider Last Rate Last Dose  . acetaminophen (TYLENOL) tablet 650 mg  650 mg Oral Q4H PRN Antonietta Breach, PA-C      . LORazepam (ATIVAN) tablet 1 mg  1 mg Oral Q8H PRN Antonietta Breach, PA-C      . nicotine (NICODERM CQ - dosed in mg/24 hours) patch 21 mg  21 mg Transdermal Daily Antonietta Breach, PA-C   21 mg at 08/02/15 1246  . ondansetron (ZOFRAN) tablet 4 mg  4 mg Oral Q8H PRN Antonietta Breach,  PA-C      . zolpidem (AMBIEN) tablet 5 mg  5 mg Oral QHS PRN Antonietta Breach, PA-C       Current Outpatient Prescriptions  Medication Sig Dispense Refill  . benztropine (COGENTIN) 1 MG tablet Take 1 tablet (1 mg total) by mouth 2 (two) times daily. 60 tablet 0  . risperiDONE (RISPERDAL) 3 MG tablet Take 1 tablet (3 mg total) by mouth 2 (two) times daily. 60 tablet 0  . traZODone (DESYREL) 50 MG tablet Take 1 tablet (50 mg total) by mouth at bedtime. 30 tablet 0    Musculoskeletal: Strength & Muscle Tone: within normal limits Gait & Station: normal Patient leans: N/A  Psychiatric Specialty Exam: Review of Systems  Constitutional: Negative.   HENT: Negative.   Respiratory: Negative.   Cardiovascular: Negative.   Gastrointestinal: Negative.   Genitourinary: Negative.   Skin: Negative.   Neurological: Negative.   Endo/Heme/Allergies: Negative.     Blood pressure 102/58, pulse 62, temperature 98.3 F (36.8 C), temperature source Oral, resp. rate 16, SpO2 100 %.There is no weight on file to calculate BMI.  General Appearance: Casual  Eye Contact::  Minimal  Speech:  Pressured  Volume:  Normal  Mood:  Angry, Depressed and Irritable  Affect:  Congruent and Labile  Thought Process:  Disorganized, Loose and Tangential  Orientation:  Full (Time, Place, and Person)  Thought Content:  Paranoid Ideation  Suicidal Thoughts:  No  Homicidal Thoughts:  No  Memory:  Immediate;   Poor Recent;   Poor Remote;   Poor  Judgement:  Poor  Insight:  Shallow  Psychomotor Activity:  Increased and Restlessness  Concentration:  Poor  Recall:  Poor  Fund of Knowledge:Poor  Language: Fair  Akathisia:  No  Handed:  Right  AIMS (if indicated):     Assets:  Desire for Improvement  ADL's:  Intact  Cognition: WNL  Sleep:      Treatment Plan Summary: Daily contact with patient to assess and evaluate symptoms and progress in treatment and Medication management  Disposition: Accepted for admission,  we will seek placement at any facility with available bed.  Risperdal 1 mg po bid for mood control, Amantadine 1 mg po daily, Trazodone 50 mg po at bed time for sleep and Hydroxyzine 50 mg po every 8 hours  For agitation.  Delfin Gant   PMHNP-BC 08/02/2015 5:22 PM Agree with NP Note and Assessment as above

## 2015-08-02 NOTE — BH Assessment (Signed)
Assessment completed. Consulted Hulan FessIjeoma Nwaeze, NP who recommended inpatient treatment. TTS to seek placement. Informed [facility],PA-C of the recommendation.

## 2015-08-02 NOTE — ED Notes (Signed)
Pt continues to decline PO medication, medicated w/o difficulty

## 2015-08-02 NOTE — ED Notes (Addendum)
Up to the bathroom, ambulatory w/o difficulty 

## 2015-08-03 MED ORDER — LORAZEPAM 2 MG/ML IJ SOLN
2.0000 mg | Freq: Once | INTRAMUSCULAR | Status: AC
  Administered 2015-08-03: 2 mg via INTRAMUSCULAR
  Filled 2015-08-03: qty 1

## 2015-08-03 NOTE — ED Notes (Signed)
Up in hall yelling, cursing, not redirectable wanting to leave, demanding to see papers, but declined to look at them. NP aware

## 2015-08-03 NOTE — ED Notes (Signed)
Pt back to his room, medicated w/o difficulty.  Pt continued to decline to take the PO meds. Rambling talking about his grandfather,  Ivor CostaBible, his family name.

## 2015-08-03 NOTE — ED Notes (Signed)
Talking w/ tts in hall 

## 2015-08-03 NOTE — Progress Notes (Signed)
Disposition CSW completed additional patient referrals to the following inpatient psych facilities:  [facility]  CSW will continue to follow patient for placement needs.  Seward SpeckLeo Kerin Kren [facility] Inc[facility]W,LCAS Behavioral Health Disposition CSW [phone]

## 2015-08-03 NOTE — ED Notes (Signed)
Pt. Noted in room. No complaints or concerns voiced. No distress or abnormal behavior noted. Will continue to monitor with security cameras. Q 15 minute rounds continue. Pt. States he is feeling better and needs to get back to work.

## 2015-08-03 NOTE — ED Notes (Signed)
Pt. Noted sleeping in room. No complaints or concerns voiced. No distress or abnormal behavior noted. Will continue to monitor with security cameras. Q 15 minute rounds continue. 

## 2015-08-03 NOTE — ED Notes (Signed)
Pt ambulatory back to room to lay down, gait unsteady at times

## 2015-08-03 NOTE — ED Notes (Signed)
Snack and beverage given. 

## 2015-08-03 NOTE — ED Notes (Signed)
Talking w/ TTS. 

## 2015-08-03 NOTE — ED Notes (Signed)
Pt up ambulatory to dayroom.  Pt's gait unsteady at times.  Pt instructed to sit in dayroom or return to room. Juice given
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
## 2015-08-03 NOTE — ED Notes (Signed)
Report received from LuAnn RN. Pt. Alert and oriented in no distress denies SI, HI, AVH and pain.  Pt. Instructed to come to me with problems or concerns.Will continue to monitor for safety via security cameras and Q 15 minute checks.  

## 2015-08-04 DIAGNOSIS — R45851 Suicidal ideations: Secondary | ICD-10-CM

## 2015-08-04 DIAGNOSIS — F203 Undifferentiated schizophrenia: Secondary | ICD-10-CM

## 2015-08-04 NOTE — ED Notes (Signed)
Pt. Noted sleeping in room. No complaints or concerns voiced. No distress or abnormal behavior noted. Will continue to monitor with security cameras. Q 15 minute rounds continue. 

## 2015-08-04 NOTE — ED Notes (Signed)
Pt awake, alert & responsive, no distress noted, watching TV at present. Calm & cooperative, monitoring for safety, Q 15 min checks in effect.

## 2015-08-04 NOTE — BH Assessment (Signed)
[facility] Assessment Progress Note  The following facilities have been contacted to seek placement for this pt, with results as noted:  Beds available, information sent, decision pending:  [facility]   At capacity:  [facility] (no male beds) [facility]   Jinna Weinman, [place] Triage Specialist [phone]

## 2015-08-04 NOTE — BH Assessment (Signed)
French Anaracy [facility] called back for clarification about patients living location. This Clinical research associatewriter informed her of patient stating that he is homeless and has been in this area for about seven months to be close to family.  She states that she will check to see if [facility] and is placing the patient on the waiting list and will call this writer back after she checks to see if there are [facility] beds available at that facility.     Davina PokeJoVea Garnette Greb, LCSW Therapeutic Triage Specialist [facility] 08/04/2015 4:44 PM

## 2015-08-04 NOTE — BHH Counselor (Addendum)
French Anaracy called from [facility] and states that she is concerned about this patients living arrangements.  Patient has indicated [place]. Informed French Anaracy of this information. Confirmed with patient that he is currently homeless [place]. HE states that he lived in [place] and his aunt told him that he cannot return to her home when he was released from the [employer].  Patient states that [place] to [place] closer to family and that did not workout as planned.    Davina PokeJoVea Amado Andal, LCSW Therapeutic Triage Specialist [facility] 08/04/2015 4:26 PM
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
## 2015-08-04 NOTE — ED Notes (Signed)
Patient is very agitated stating that "everyone just wants me to take medicine that makes me sleepy, that is the only way I am getting out of here"  He is loud and angry.  He reluctantly took his meds.  He appears very delusional and paranoid.  15 minute checks and video monitoring continue.

## 2015-08-05 DIAGNOSIS — F4310 Post-traumatic stress disorder, unspecified: Secondary | ICD-10-CM | POA: Diagnosis present

## 2015-08-05 MED ORDER — PRAZOSIN HCL 2 MG PO CAPS
2.0000 mg | ORAL_CAPSULE | Freq: Every day | ORAL | Status: DC
  Filled 2015-08-05: qty 1

## 2015-08-05 NOTE — BH Assessment (Signed)
Re-assessment:   Writer unable to meet with patient today as he was already dis positioned. Patient accepted to [facility], per [place]. The accepting provider is Dr. Betti Cruzeddy. Please call report to [phone].

## 2015-08-05 NOTE — ED Notes (Signed)
Pt is paranoid about us and the government; he has religious preoccupation; he refused to take Symmetrel and his Nicoderm patch stating that he does not need them. "I know what I take and I do not know why you are trying to give those to me. There is nothing wrong with me."

## 2015-08-05 NOTE — BH Assessment (Signed)
[facility]  Per Melynda Rippleoyka Perry, TS, this pt has been accepted to [facility] by Dr Betti Cruzeddy.  Thedore MinsMojeed Akintayo, MD concurs with this decision.  Please call report to [phone].  Pt's nurse, Diane, has been notified.  [facility] is under IVC [facility]  Doylene Canninghomas Telitha Plath, MA Triage Specialist [phone]

## 2015-08-05 NOTE — ED Notes (Signed)
Affect flat, mood appears depressed. Pt is annoyed that his brother put him here because he has a job opportunity and needs to leave.

## 2015-08-21 ENCOUNTER — Emergency Department (HOSPITAL_COMMUNITY)
Admission: EM | Admit: 2015-08-21 | Discharge: 2015-08-22 | Discharge status: Against Medical Advice | Payer: Medicaid Other | Attending: Emergency Medicine | Admitting: Emergency Medicine

## 2015-08-21 ENCOUNTER — Encounter (HOSPITAL_COMMUNITY): Payer: Self-pay | Admitting: Emergency Medicine

## 2015-08-21 DIAGNOSIS — T426X5A Adverse effect of other antiepileptic and sedative-hypnotic drugs, initial encounter: Secondary | ICD-10-CM | POA: Insufficient documentation

## 2015-08-21 DIAGNOSIS — T43595A Adverse effect of other antipsychotics and neuroleptics, initial encounter: Secondary | ICD-10-CM | POA: Insufficient documentation

## 2015-08-21 DIAGNOSIS — F17210 Nicotine dependence, cigarettes, uncomplicated: Secondary | ICD-10-CM | POA: Insufficient documentation

## 2015-08-21 DIAGNOSIS — R200 Anesthesia of skin: Secondary | ICD-10-CM | POA: Insufficient documentation

## 2015-08-21 DIAGNOSIS — R252 Cramp and spasm: Secondary | ICD-10-CM | POA: Insufficient documentation

## 2015-08-21 DIAGNOSIS — R253 Fasciculation: Secondary | ICD-10-CM | POA: Insufficient documentation

## 2015-08-21 HISTORY — DX: Major depressive disorder, single episode, unspecified: F32.9

## 2015-08-21 HISTORY — DX: Depression, unspecified: F32.A

## 2015-08-21 HISTORY — DX: Post-traumatic stress disorder, unspecified: F43.10

## 2015-08-21 NOTE — ED Notes (Signed)
Pt comes from home after being at [facility] recently. States that since Sunday he has had tongue numbness and twitching after getting invega and depakote at the facility. Has RX but has not filled it. Alert and oriented.

## 2015-08-22 ENCOUNTER — Emergency Department (HOSPITAL_COMMUNITY)
Admission: EM | Admit: 2015-08-22 | Discharge: 2015-08-23 | Disposition: A | Discharge status: Home / Self Care | Payer: Medicaid Other | Attending: Emergency Medicine | Admitting: Emergency Medicine

## 2015-08-22 ENCOUNTER — Encounter (HOSPITAL_COMMUNITY): Payer: Self-pay | Admitting: Emergency Medicine

## 2015-08-22 DIAGNOSIS — Z0000 Encounter for general adult medical examination without abnormal findings: Secondary | ICD-10-CM

## 2015-08-22 DIAGNOSIS — F17210 Nicotine dependence, cigarettes, uncomplicated: Secondary | ICD-10-CM | POA: Insufficient documentation

## 2015-08-22 DIAGNOSIS — R251 Tremor, unspecified: Secondary | ICD-10-CM | POA: Insufficient documentation

## 2015-08-22 DIAGNOSIS — T426X5A Adverse effect of other antiepileptic and sedative-hypnotic drugs, initial encounter: Secondary | ICD-10-CM | POA: Insufficient documentation

## 2015-08-22 DIAGNOSIS — F4310 Post-traumatic stress disorder, unspecified: Secondary | ICD-10-CM | POA: Insufficient documentation

## 2015-08-22 DIAGNOSIS — Z711 Person with feared health complaint in whom no diagnosis is made: Secondary | ICD-10-CM | POA: Insufficient documentation

## 2015-08-22 NOTE — ED Notes (Signed)
Patient states that was here last night.  Thinks he is having a reaction to his new medication  That he has just recently been prescribed within the last week he is currently taking Envega and depakote.

## 2015-08-22 NOTE — ED Provider Notes (Signed)
CSN: [id number]     Arrival date & time 08/22/15  2043 History   First MD Initiated Contact with Patient 08/22/15 2247     Chief Complaint  Patient presents with  . Allergic Reaction   HPI   25 year old male presents today with complaints of tongue numbness. Patient reports that he was recently hospitalized for psychiatric illness, started on Depakote. He reports that after leaving the facility he discontinue taking Depakote. He started to develop tingling and reported swelling to his tongue. Patient reports that he also has body twitching in addition to the intraoral complaints. He denies any difficulty breathing, swallowing, chest pain or shortness of breath. He denies any rash, suicidal or homicidal ideations. He denies any hallucinations.  Chart review shows last known outpatient medications as Cogentin, Risperdal, trazodone  Past Medical History  Diagnosis Date  . Psychotic disorder   . PTSD (post-traumatic stress disorder)   . Depression    History reviewed. No pertinent past surgical history. History reviewed. No pertinent family history. Social History  Substance Use Topics  . Smoking status: Current Every Day Smoker -- 3.00 packs/day    Types: Cigarettes  . Smokeless tobacco: None  . Alcohol Use: No    Review of Systems  All other systems reviewed and are negative.   Allergies  Depakote and Invega  Home Medications   Prior to Admission medications   Medication Sig Start Date End Date Taking? Authorizing Provider  benztropine (COGENTIN) 1 MG tablet Take 1 tablet (1 mg total) by mouth 2 (two) times daily. Patient not taking: Reported on 08/21/2015 06/05/14   Thermon Leyland, NP  risperiDONE (RISPERDAL) 3 MG tablet Take 1 tablet (3 mg total) by mouth 2 (two) times daily. Patient not taking: Reported on 08/21/2015 06/05/14   Thermon Leyland, NP  traZODone (DESYREL) 50 MG tablet Take 1 tablet (50 mg total) by mouth at bedtime. Patient not taking: Reported on 08/21/2015  06/05/14   Thermon Leyland, NP   BP 140/97 mmHg  Pulse 105  Temp(Src) 98.1 F (36.7 C) (Oral)  Resp 18  SpO2 98%   Physical Exam  Constitutional: He is oriented to person, place, and time. He appears well-developed and well-nourished.  HENT:  Head: Normocephalic and atraumatic.  Eyes: Conjunctivae are normal. Pupils are equal, round, and reactive to light. Right eye exhibits no discharge. Left eye exhibits no discharge. No scleral icterus.  Neck: Normal range of motion. No JVD present. No tracheal deviation present.  Pulmonary/Chest: Effort normal. No stridor.  Neurological: He is alert and oriented to person, place, and time. Coordination normal.  Minor tremor to hands bilateral   Psychiatric: He has a normal mood and affect. His behavior is normal. Judgment and thought content normal.  Nursing note and vitals reviewed.     ED Course  Procedures (including critical care time) Labs Review Labs Reviewed  VALPROIC ACID LEVEL - Abnormal; Notable for the following:    Valproic Acid Lvl <10 (*)    All other components within normal limits  CBC WITH DIFFERENTIAL/PLATELET  AMMONIA  BASIC METABOLIC PANEL    Imaging Review No results found. I have personally reviewed and evaluated these images and lab results as part of my medical decision-making.   EKG Interpretation None      MDM   Final diagnoses:  Drug side effects, initial encounter    Labs:  Imaging:  Consults:  Therapeutics:  Discharge Meds:   Assessment/Plan: 37 YOM presents today with complaint of tongue  swelling and side effects from medication. Chart review shows no indication that patient was taking reported Depakote, but unable to access all of his psychiatric information. In any event patient Depakote level be checked here, he has no significant neurological deficits here, symptoms have been present since discharge and not worsen. I see no intraoral swelling or signs of anaphylactic type reaction. Patient  has no suicide, homicidal ideations, and reports no hallucinations. Patient's care will be handed off to the oncoming provider pending laboratory analysis and disposition.         Eyvonne Mechanic, PA-C 08/23/15 0110  Dione Booze, MD 08/23/15 [phone]

## 2015-08-22 NOTE — ED Notes (Signed)
Patient is complaining of allergic reaction. Patient does not have a rash. His tongue is not swollen. Patient is crying uncontrollable.

## 2015-08-23 LAB — CBC WITH DIFFERENTIAL/PLATELET
Basophils Absolute: 0 10*3/uL (ref 0.0–0.1)
Basophils Relative: 0 %
Eosinophils Absolute: 0.1 10*3/uL (ref 0.0–0.7)
Eosinophils Relative: 1 %
HCT: 42.2 % (ref 39.0–52.0)
Hemoglobin: 14.1 g/dL (ref 13.0–17.0)
Lymphocytes Relative: 13 %
Lymphs Abs: 1.1 10*3/uL (ref 0.7–4.0)
MCH: 30.1 pg (ref 26.0–34.0)
MCHC: 33.4 g/dL (ref 30.0–36.0)
MCV: 90.2 fL (ref 78.0–100.0)
Monocytes Absolute: 0.5 10*3/uL (ref 0.1–1.0)
Monocytes Relative: 6 %
Neutro Abs: 6.4 10*3/uL (ref 1.7–7.7)
Neutrophils Relative %: 80 %
Platelets: 251 10*3/uL (ref 150–400)
RBC: 4.68 MIL/uL (ref 4.22–5.81)
RDW: 13.5 % (ref 11.5–15.5)
WBC: 8.1 10*3/uL (ref 4.0–10.5)

## 2015-08-23 LAB — BASIC METABOLIC PANEL
Anion gap: 8 (ref 5–15)
BUN: 7 mg/dL (ref 6–20)
CO2: 26 mmol/L (ref 22–32)
Calcium: 8.8 mg/dL — ABNORMAL LOW (ref 8.9–10.3)
Chloride: 107 mmol/L (ref 101–111)
Creatinine, Ser: 0.91 mg/dL (ref 0.61–1.24)
GFR calc Af Amer: >60 mL/min (ref >60)
GFR calc non Af Amer: >60 mL/min (ref >60)
Glucose, Bld: 107 mg/dL — ABNORMAL HIGH (ref 65–99)
Potassium: 3.8 mmol/L (ref 3.5–5.1)
Sodium: 141 mmol/L (ref 135–145)

## 2015-08-23 LAB — VALPROIC ACID LEVEL: Valproic Acid Lvl: <10 ug/mL — ABNORMAL LOW (ref 50.0–100.0)

## 2015-08-23 LAB — AMMONIA: Ammonia: 33 umol/L (ref 9–35)

## 2015-08-23 NOTE — ED Provider Notes (Signed)
[facility] in December and states they started Depakote while inpatient. He subsequently had tongue swelling and tremors and was concerned regarding allergic reaction or toxicity.   Pending labs and reassessment.   Labs negative for Depakote. He is observed over time for further sign of allergic reaction. No facial or tongue swelling. No rash. He is sleeping, easily aroused. VSS. He can be discharged home with return precautions.  Elpidio Anis, PA-C 08/23/15 [id number]  Dione Booze, MD 08/23/15 [phone]

## 2015-08-23 NOTE — Discharge Instructions (Signed)
FOLLOW UP WITH YOUR DOCTOR FOR RECHECK IN 1-2 DAYS.

## 2015-08-23 NOTE — ED Notes (Signed)
Patient d/c'd self care.  F/U reviewed with patient.  Patient verbalized understanding. 

## 2015-08-23 NOTE — ED Notes (Signed)
Patient given bus pass.

## 2015-08-24 ENCOUNTER — Emergency Department (HOSPITAL_COMMUNITY)
Admission: EM | Admit: 2015-08-24 | Discharge: 2015-08-27 | Disposition: A | Discharge status: Other Acute Inpatient Hospital | Payer: Medicaid Other | Attending: Emergency Medicine | Admitting: Emergency Medicine

## 2015-08-24 ENCOUNTER — Encounter (HOSPITAL_COMMUNITY): Payer: Self-pay | Admitting: Emergency Medicine

## 2015-08-24 DIAGNOSIS — F17210 Nicotine dependence, cigarettes, uncomplicated: Secondary | ICD-10-CM | POA: Insufficient documentation

## 2015-08-24 DIAGNOSIS — F4310 Post-traumatic stress disorder, unspecified: Secondary | ICD-10-CM | POA: Diagnosis present

## 2015-08-24 DIAGNOSIS — F1210 Cannabis abuse, uncomplicated: Secondary | ICD-10-CM | POA: Insufficient documentation

## 2015-08-24 DIAGNOSIS — R4689 Other symptoms and signs involving appearance and behavior: Secondary | ICD-10-CM

## 2015-08-24 DIAGNOSIS — G2402 Drug induced acute dystonia: Secondary | ICD-10-CM | POA: Diagnosis present

## 2015-08-24 DIAGNOSIS — T43505A Adverse effect of unspecified antipsychotics and neuroleptics, initial encounter: Secondary | ICD-10-CM

## 2015-08-24 DIAGNOSIS — F203 Undifferentiated schizophrenia: Secondary | ICD-10-CM | POA: Diagnosis present

## 2015-08-24 DIAGNOSIS — F919 Conduct disorder, unspecified: Secondary | ICD-10-CM | POA: Insufficient documentation

## 2015-08-24 LAB — CBC WITH DIFFERENTIAL/PLATELET
BASOS PCT: 0 %
Basophils Absolute: 0 10*3/uL (ref 0.0–0.1)
Eosinophils Absolute: 0 10*3/uL (ref 0.0–0.7)
Eosinophils Relative: 0 %
HEMATOCRIT: 42.9 % (ref 39.0–52.0)
Hemoglobin: 14.4 g/dL (ref 13.0–17.0)
LYMPHS PCT: 7 %
Lymphs Abs: 0.8 10*3/uL (ref 0.7–4.0)
MCH: 30 pg (ref 26.0–34.0)
MCHC: 33.6 g/dL (ref 30.0–36.0)
MCV: 89.4 fL (ref 78.0–100.0)
MONO ABS: 0.5 10*3/uL (ref 0.1–1.0)
MONOS PCT: 4 %
NEUTROS ABS: 10.5 10*3/uL — AB (ref 1.7–7.7)
Neutrophils Relative %: 89 %
Platelets: 271 10*3/uL (ref 150–400)
RBC: 4.8 MIL/uL (ref 4.22–5.81)
RDW: 13.3 % (ref 11.5–15.5)
WBC: 11.8 10*3/uL — ABNORMAL HIGH (ref 4.0–10.5)

## 2015-08-24 LAB — COMPREHENSIVE METABOLIC PANEL
ALBUMIN: 3.7 g/dL (ref 3.5–5.0)
ALT: 22 U/L (ref 17–63)
AST: 29 U/L (ref 15–41)
Alkaline Phosphatase: 50 U/L (ref 38–126)
Anion gap: 10 (ref 5–15)
BILIRUBIN TOTAL: 0.7 mg/dL (ref 0.3–1.2)
BUN: 8 mg/dL (ref 6–20)
CO2: 25 mmol/L (ref 22–32)
CREATININE: 1.07 mg/dL (ref 0.61–1.24)
Calcium: 8.8 mg/dL — ABNORMAL LOW (ref 8.9–10.3)
Chloride: 106 mmol/L (ref 101–111)
GFR calc Af Amer: >60 mL/min (ref >60)
GFR calc non Af Amer: >60 mL/min (ref >60)
GLUCOSE: 114 mg/dL — AB (ref 65–99)
POTASSIUM: 3.8 mmol/L (ref 3.5–5.1)
Sodium: 141 mmol/L (ref 135–145)
Total Protein: 6.5 g/dL (ref 6.5–8.1)

## 2015-08-24 LAB — RAPID URINE DRUG SCREEN, HOSP PERFORMED
Amphetamines: NOT DETECTED
BARBITURATES: NOT DETECTED
Benzodiazepines: NOT DETECTED
Cocaine: NOT DETECTED
Opiates: NOT DETECTED
Tetrahydrocannabinol: POSITIVE — AB

## 2015-08-24 LAB — VALPROIC ACID LEVEL

## 2015-08-24 LAB — ETHANOL: Alcohol, Ethyl (B): <5 mg/dL (ref <5)

## 2015-08-24 MED ORDER — IBUPROFEN 200 MG PO TABS: 600.0000 mg | ORAL_TABLET | Freq: Three times a day (TID) | ORAL | Status: DC | PRN

## 2015-08-24 MED ORDER — ONDANSETRON HCL 4 MG PO TABS: 4.0000 mg | ORAL_TABLET | Freq: Three times a day (TID) | ORAL | Status: DC | PRN

## 2015-08-24 MED ORDER — DIPHENHYDRAMINE HCL 50 MG/ML IJ SOLN
50.0000 mg | Freq: Once | INTRAMUSCULAR | Status: AC
  Administered 2015-08-24: 50 mg via INTRAVENOUS
  Filled 2015-08-24: qty 1

## 2015-08-24 MED ORDER — ZOLPIDEM TARTRATE 5 MG PO TABS: 5.0000 mg | ORAL_TABLET | Freq: Every evening | ORAL | Status: DC | PRN

## 2015-08-24 MED ORDER — ACETAMINOPHEN 325 MG PO TABS
650.0000 mg | ORAL_TABLET | ORAL | Status: DC | PRN
  Administered 2015-08-24: 650 mg via ORAL
  Filled 2015-08-24: qty 2

## 2015-08-24 NOTE — ED Provider Notes (Signed)
CSN: [id number]     Arrival date & time 08/24/15  1645 History   First MD Initiated Contact with Patient 08/24/15 1710     Chief Complaint  Patient presents with  . Altered Mental Status     (Consider location/radiation/quality/duration/timing/severity/associated sxs/prior Treatment) HPI Comments: The patient is a 25 year old male, he has a history of psychiatric disorder , recently admitted to [facility] and left with the diagnosis of a schizoaffective bipolar type disorder as well as a acute depressive disorder and substance-induced mood disorder. The patient has had a couple of visits this week, the first time he left without being seen, the second time was 2 days ago during which time he complained of tongue numbness and tingling, there is no signs of acute allergic reaction, he had a prolonged observation and labs showing he was not taking his Depakote, the patient had no decline in his status, no signs of allergic reaction and was discharged. He comes back today I ambulance, on arrival the patient is yelling and screaming and moaning and crying and holding his hands in a "drawn up" position. The patient is unable to answer questions other than mumbling through his mouth and through his sobs.  Patient is a 25 y.o. male presenting with altered mental status. The history is provided by the patient.  Altered Mental Status   Past Medical History  Diagnosis Date  . Psychotic disorder   . PTSD (post-traumatic stress disorder)   . Depression    History reviewed. No pertinent past surgical history. History reviewed. No pertinent family history. Social History  Substance Use Topics  . Smoking status: Current Every Day Smoker -- 3.00 packs/day    Types: Cigarettes  . Smokeless tobacco: None  . Alcohol Use: No    Review of Systems  Unable to perform ROS: Mental status change      Allergies  Depakote and Invega  Home Medications   Prior to Admission medications    Medication Sig Start Date End Date Taking? Authorizing Provider  benztropine (COGENTIN) 1 MG tablet Take 1 tablet (1 mg total) by mouth 2 (two) times daily. Patient not taking: Reported on 08/21/2015 06/05/14   Thermon Leyland, NP  risperiDONE (RISPERDAL) 3 MG tablet Take 1 tablet (3 mg total) by mouth 2 (two) times daily. Patient not taking: Reported on 08/21/2015 06/05/14   Thermon Leyland, NP  traZODone (DESYREL) 50 MG tablet Take 1 tablet (50 mg total) by mouth at bedtime. Patient not taking: Reported on 08/21/2015 06/05/14   Thermon Leyland, NP   BP 148/85 mmHg  Pulse 103  Temp(Src) 98.7 F (37.1 C) (Oral)  Resp 16  SpO2 97% Physical Exam  Constitutional: He appears well-developed and well-nourished. No distress.  HENT:  Head: Normocephalic and atraumatic.  Mouth/Throat: Oropharynx is clear and moist. No oropharyngeal exudate.  Eyes: Conjunctivae and EOM are normal. Pupils are equal, round, and reactive to light. Right eye exhibits no discharge. Left eye exhibits no discharge. No scleral icterus.  Neck: Normal range of motion. Neck supple. No JVD present. No thyromegaly present.  Cardiovascular: Normal rate, regular rhythm, normal heart sounds and intact distal pulses.  Exam reveals no gallop and no friction rub.   No murmur heard. Pulmonary/Chest: Effort normal and breath sounds normal. No respiratory distress. He has no wheezes. He has no rales.  Abdominal: Soft. Bowel sounds are normal. He exhibits no distension and no mass. There is no tenderness.  Musculoskeletal: Normal range of motion. He exhibits  no edema or tenderness.  Lymphadenopathy:    He has no cervical adenopathy.  Neurological: He is alert.  The patient holds his hands in and out right position, fingers together, stiff, upper extremities. Slightly rigid, lower extremities have slight rigidity but compartments are soft, no deformities  Skin: Skin is warm and dry. No rash noted. No erythema.  Psychiatric:  Tearful crying,  moaning, patient does not answer questions, unable to assess other psychiatric exam  Nursing note and vitals reviewed.   ED Course  Procedures (including critical care time) Labs Review Labs Reviewed  COMPREHENSIVE METABOLIC PANEL  ETHANOL  CBC WITH DIFFERENTIAL/PLATELET  URINE RAPID DRUG SCREEN, HOSP PERFORMED  VALPROIC ACID LEVEL    Imaging Review No results found. I have personally reviewed and evaluated these images and lab results as part of my medical decision-making.   EKG Interpretation   Date/Time:  Sunday August 24 2015 17:03:36 EST Ventricular Rate:  110 PR Interval:  159 QRS Duration: 97 QT Interval:  331 QTC Calculation: 448 R Axis:   72 Text Interpretation:  Sinus tachycardia Since last tracing rate faster  Confirmed by Ngozi Alvidrez  MD, Nicolus Ose ([id number]) on 08/25/2015 12:08:51 AM      MDM   Final diagnoses:  None    Dystonic type reaction. That being said once the patient was placed in a bed he was able to stand up walking out of the room and then laid down on the floor in the hallway and continued to scream. He was screaming things like "I can't move my legs", after he had just walked. With the assistance of police department and security were able to place the patient back in the bed, soft restraints were placed due to this patient's safety concern. The patient did not fight or arguing with this. At change of shift - care signed out to oncoming physician     Eber Hong, MD 08/25/15 1347

## 2015-08-24 NOTE — ED Notes (Signed)
Pt. Noted sleeping in room. No complaints or concerns voiced. No distress or abnormal behavior noted. Will continue to monitor with security cameras. Q 15 minute rounds continue. 

## 2015-08-24 NOTE — ED Notes (Signed)
Upper restraints removed

## 2015-08-24 NOTE — ED Notes (Signed)
Pt walked out of room and laid down on the floor in the hallway. No fall. Pt began yelling "I can't move my legs" although he had just walked into the hallway. MD at bedside ordered restraints as pt unable to follow directions and has poor judgement.

## 2015-08-24 NOTE — ED Notes (Addendum)
Per EMS. Pt recently released from [facility]. Was given IM invega and PO depakote prior to d/c on 1/13. Pt reported he has been having "tongue swelling," tremors and generalized body pain. Pt began screaming upon arrival to ED. Pt initially kept body rigid and stuck unswollen tongue out. Pt now moving around in bed and asking for a urinal. Pt has been seen her 2x for same recently.

## 2015-08-24 NOTE — ED Notes (Addendum)
TTS at bedside. Removed lower restraints
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
## 2015-08-24 NOTE — ED Notes (Signed)
Pt. In shower. 

## 2015-08-24 NOTE — ED Notes (Signed)
Pt. Noted in room. No complaints or concerns voiced. No distress or abnormal behavior noted. Will continue to monitor with security cameras. Q 15 minute rounds continue. 

## 2015-08-24 NOTE — ED Notes (Signed)
Pt

## 2015-08-24 NOTE — ED Notes (Signed)
Pt. To [facility] from ED ambulatory without difficulty, to room 34 . Report from [facility]. Pt. Is alert, warm and dry in no acute distress. Pt. Ambulates and speaks slowly. Pt. Denies SI, HI, and AVH. Pt. Calm and cooperative. Pt. Made aware of security cameras and Q15 minute rounds. Pt. Encouraged to let Nursing staff know of any concerns or needs.

## 2015-08-24 NOTE — ED Notes (Signed)
Pt ambulated to restroom unassisted 

## 2015-08-25 DIAGNOSIS — G2402 Drug induced acute dystonia: Secondary | ICD-10-CM | POA: Diagnosis not present

## 2015-08-25 DIAGNOSIS — T43505A Adverse effect of unspecified antipsychotics and neuroleptics, initial encounter: Secondary | ICD-10-CM

## 2015-08-25 DIAGNOSIS — F4310 Post-traumatic stress disorder, unspecified: Secondary | ICD-10-CM | POA: Diagnosis not present

## 2015-08-25 DIAGNOSIS — F203 Undifferentiated schizophrenia: Secondary | ICD-10-CM | POA: Diagnosis not present

## 2015-08-25 MED ORDER — PRAZOSIN HCL 2 MG PO CAPS
2.0000 mg | ORAL_CAPSULE | Freq: Every day | ORAL | Status: DC
  Administered 2015-08-25: 2 mg via ORAL
  Filled 2015-08-25 (×2): qty 1

## 2015-08-25 MED ORDER — SERTRALINE HCL 50 MG PO TABS
50.0000 mg | ORAL_TABLET | Freq: Every day | ORAL | Status: DC
  Administered 2015-08-25 – 2015-08-27 (×3): 50 mg via ORAL
  Filled 2015-08-25 (×3): qty 1

## 2015-08-25 MED ORDER — DIPHENHYDRAMINE HCL 25 MG PO CAPS
25.0000 mg | ORAL_CAPSULE | Freq: Two times a day (BID) | ORAL | Status: DC
  Administered 2015-08-25 – 2015-08-27 (×4): 25 mg via ORAL
  Filled 2015-08-25 (×4): qty 1

## 2015-08-25 MED ORDER — DIAZEPAM 5 MG PO TABS
5.0000 mg | ORAL_TABLET | Freq: Three times a day (TID) | ORAL | Status: DC
  Administered 2015-08-25 – 2015-08-27 (×8): 5 mg via ORAL
  Filled 2015-08-25 (×8): qty 1

## 2015-08-25 NOTE — ED Notes (Signed)
Patient drowsy. Reports that he had feelings of SI after taking ordered medications. Denies HI, AVH. Patient is cooperative.   Encouragement offered. Snack provided.  Q 15 safety checks continue.

## 2015-08-25 NOTE — Consult Note (Signed)
[facility] Consult   Reason for Consult:  Depression and "Tongue swelling and muscle spasms" Referring Physician:  EDP Patient Identification: Carlos [name] MRN:  [id number] Principal Diagnosis: Neuroleptic induced acute dystonia Diagnosis:   Patient Active Problem List   Diagnosis Date Noted  . Undifferentiated schizophrenia ([place]) [F20.3]     Priority: High  . Psychosis [F29] 06/01/2014    Priority: High  . Homicidal ideations [R45.850] 06/01/2014    Priority: High  . 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 Posttraumatic stress disorder [F43.10] 08/05/2015  . Schizophrenia ([place]) [F20.9]   . Psychoses [F29]   . Cannabis use disorder, moderate, dependence ([place]) [F12.20]   . Tobacco use disorder [F17.200]   . Psychotic disorder [F29] 06/02/2014    Total Time spent with patient: 45 minutes  Subjective:   Carlos [name] is a 25 y.o. male patient admitted with depression, muscle spasms, and tremors.  HPI:  Patient was admitted to the [facility] with reported "tongue swelling" and "muscle spasms and tremors." Patient is also reporting muscle stiffness and generalized body pain. The patient was recently discharged on 08/15/15 from [facility] on antipsychotic medication Lorayne Bender) and Depakote. Patient states that after his symptoms worsened within the past week, he discontinued his medication and came to the emergency department. On assessment, the patient has muscle stiffness and muscle spasms. No tongue swelling is noted at this time. The patient also reports depression and became tearful when asked about his diagnosis of PTSD. Patient reports that his childhood was "bad" and that he misses his mother who has passed away. Patient denies suicidal ideation, homicidal ideation, and auditory or visual hallucinations. Patient denies any alcohol or drug use.  Past Psychiatric History: Patient reports diagnosis of PTSD and recent hospitalization at [facility]  (was discharged on 08/15/15)  Risk to Self: Suicidal Ideation: Yes-Currently Present Suicidal Intent: No-Not Currently/Within Last 6 Months Is patient at risk for suicide?: Yes Suicidal Plan?: No-Not Currently/Within Last 6 Months Access to Means: No What has been your use of drugs/alcohol within the last 12 months?: THC How many times?: 0 Other Self Harm Risks: 0 Intentional Self Injurious Behavior: None Risk to Others: Homicidal Ideation: No-Not Currently/Within Last 6 Months Thoughts of Harm to Others: No-Not Currently Present/Within Last 6 Months Current Homicidal Intent: No-Not Currently/Within Last 6 Months Current Homicidal Plan: No-Not Currently/Within Last 6 Months Access to Homicidal Means: No Identified Victim: na History of harm to others?: No Assessment of Violence: None Noted Violent Behavior Description: na Does patient have access to weapons?: No Criminal Charges Pending?: No Does patient have a court date: No Prior Inpatient Therapy: Prior Inpatient Therapy: Yes Prior Therapy Dates: 2014, 2015, 2016 Prior Therapy Facilty/Provider(s): [facility] Reason for Treatment: Psychosis Prior Outpatient Therapy: Prior Outpatient Therapy: Yes Prior Therapy Dates: 2016 Prior Therapy Facilty/Provider(s): [facility] Reason for Treatment: Psychosis Does patient have an ACCT team?: No Does patient have Intensive In-House Services?  : No Does patient have [facility] services? : Yes Does patient have [facility] services?: No  Past Medical History:  Past Medical History  Diagnosis Date  . Psychotic disorder   . PTSD (post-traumatic stress disorder)   . Depression    History reviewed. No pertinent past surgical history. Family History: History reviewed. No pertinent family history. Family Psychiatric  History: Unknown Social History:  History  Alcohol Use No     History  Drug Use  . Yes  . Special: Cocaine, Marijuana, [name], IV    Social History  Social  History  . Marital Status: Single    Spouse Name: N/A  . Number of Children: N/A  . Years of Education: N/A   Social History Main Topics  . Smoking status: Current Every Day Smoker -- 3.00 packs/day    Types: Cigarettes  . Smokeless tobacco: None  . Alcohol Use: No  . Drug Use: Yes    Special: Cocaine, Marijuana, [name], IV  . Sexual Activity: Yes    Birth Control/ Protection: Condom   Other Topics Concern  . None   Social History Narrative   Additional Social History: Patient lives with his twin brother; states relationship is "good" and that they "get along"    Pain Medications: see chart Prescriptions: see chart  Over the Counter: see chart History of alcohol / drug use?: Yes Name of Substance 1: THC                   Allergies:   Allergies  Allergen Reactions  . Depakote [Divalproex Sodium] Swelling    Tongue swells  . Invega [Paliperidone Er] Swelling    Labs:  Results for orders placed or performed during the hospital encounter of 08/24/15 (from the past 48 hour(s))  Urine rapid drug screen (hosp performed)not at [facility]     Status: Abnormal   Collection Time: 08/24/15  4:59 PM  Result Value Ref Range   Opiates NONE DETECTED NONE DETECTED   Cocaine NONE DETECTED NONE DETECTED   Benzodiazepines NONE DETECTED NONE DETECTED   Amphetamines NONE DETECTED NONE DETECTED   Tetrahydrocannabinol POSITIVE (A) NONE DETECTED   Barbiturates NONE DETECTED NONE DETECTED    Comment:        DRUG SCREEN FOR MEDICAL PURPOSES ONLY.  IF CONFIRMATION IS NEEDED FOR ANY PURPOSE, NOTIFY LAB WITHIN 5 DAYS.        LOWEST DETECTABLE LIMITS FOR URINE DRUG SCREEN Drug Class       Cutoff (ng/mL) Amphetamine      1000 Barbiturate      200 Benzodiazepine   127 Tricyclics       517 Opiates          300 Cocaine          300 THC              50   Comprehensive metabolic panel     Status: Abnormal   Collection Time: 08/24/15  5:21 PM  Result Value Ref Range    Sodium 141 135 - 145 mmol/L   Potassium 3.8 3.5 - 5.1 mmol/L   Chloride 106 101 - 111 mmol/L   CO2 25 22 - 32 mmol/L   Glucose, Bld 114 (H) 65 - 99 mg/dL   BUN 8 6 - 20 mg/dL   Creatinine, Ser 1.07 0.61 - 1.24 mg/dL   Calcium 8.8 (L) 8.9 - 10.3 mg/dL   Total Protein 6.5 6.5 - 8.1 g/dL   Albumin 3.7 3.5 - 5.0 g/dL   AST 29 15 - 41 U/L   ALT 22 17 - 63 U/L   Alkaline Phosphatase 50 38 - 126 U/L   Total Bilirubin 0.7 0.3 - 1.2 mg/dL   GFR calc non Af Amer >60 >60 mL/min   GFR calc Af Amer >60 >60 mL/min    Comment: (NOTE) The eGFR has been calculated using the CKD EPI equation. This calculation has not been validated in all clinical situations. eGFR's persistently <60 mL/min signify possible Chronic Kidney Disease.    Anion gap 10 5 -  15  Ethanol     Status: None   Collection Time: 08/24/15  5:21 PM  Result Value Ref Range   Alcohol, Ethyl (B) <5 <5 mg/dL    Comment:        LOWEST DETECTABLE LIMIT FOR SERUM ALCOHOL IS 5 mg/dL FOR MEDICAL PURPOSES ONLY   CBC with Diff     Status: Abnormal   Collection Time: 08/24/15  5:21 PM  Result Value Ref Range   WBC 11.8 (H) 4.0 - 10.5 K/uL   RBC 4.80 4.22 - 5.81 MIL/uL   Hemoglobin 14.4 13.0 - 17.0 g/dL   HCT 42.9 39.0 - 52.0 %   MCV 89.4 78.0 - 100.0 fL   MCH 30.0 26.0 - 34.0 pg   MCHC 33.6 30.0 - 36.0 g/dL   RDW 13.3 11.5 - 15.5 %   Platelets 271 150 - 400 K/uL   Neutrophils Relative % 89 %   Neutro Abs 10.5 (H) 1.7 - 7.7 K/uL   Lymphocytes Relative 7 %   Lymphs Abs 0.8 0.7 - 4.0 K/uL   Monocytes Relative 4 %   Monocytes Absolute 0.5 0.1 - 1.0 K/uL   Eosinophils Relative 0 %   Eosinophils Absolute 0.0 0.0 - 0.7 K/uL   Basophils Relative 0 %   Basophils Absolute 0.0 0.0 - 0.1 K/uL  Valproic acid level     Status: Abnormal   Collection Time: 08/24/15  5:21 PM  Result Value Ref Range   Valproic Acid Lvl <10 (L) 50.0 - 100.0 ug/mL    Comment: RESULTS CONFIRMED BY MANUAL DILUTION    Current Facility-Administered Medications   Medication Dose Route Frequency Provider Last Rate Last Dose  . acetaminophen (TYLENOL) tablet 650 mg  650 mg Oral Q4H PRN Noemi Chapel, MD   650 mg at 08/24/15 1947  . diazepam (VALIUM) tablet 5 mg  5 mg Oral TID Kenisha Lynds   5 mg at 08/25/15 1103  . diphenhydrAMINE (BENADRYL) capsule 25 mg  25 mg Oral BID PC David Rodriquez      . ibuprofen (ADVIL,MOTRIN) tablet 600 mg  600 mg Oral Q8H PRN Noemi Chapel, MD      . ondansetron [facility]) tablet 4 mg  4 mg Oral Q8H PRN Noemi Chapel, MD      . prazosin (MINIPRESS) capsule 2 mg  2 mg Oral QHS Docia Klar      . sertraline (ZOLOFT) tablet 50 mg  50 mg Oral Daily Annita Ratliff   50 mg at 08/25/15 1103   Current Outpatient Prescriptions  Medication Sig Dispense Refill  . benztropine (COGENTIN) 1 MG tablet Take 1 tablet (1 mg total) by mouth 2 (two) times daily. (Patient not taking: Reported on 08/21/2015) 60 tablet 0  . risperiDONE (RISPERDAL) 3 MG tablet Take 1 tablet (3 mg total) by mouth 2 (two) times daily. (Patient not taking: Reported on 08/21/2015) 60 tablet 0  . traZODone (DESYREL) 50 MG tablet Take 1 tablet (50 mg total) by mouth at bedtime. (Patient not taking: Reported on 08/21/2015) 30 tablet 0    Musculoskeletal: Strength & Muscle Tone: spastic Gait & Station: unsteady Patient leans: Front  Psychiatric Specialty Exam: Review of Systems  Constitutional: Negative.   HENT: Negative.   Eyes: Negative.   Respiratory: Negative.   Cardiovascular: Negative.   Gastrointestinal: Negative.   Genitourinary: Negative.   Musculoskeletal:       Muscle spasms Hand tremors  Skin: Negative.   Neurological: Negative.   Endo/Heme/Allergies: Negative.   Psychiatric/Behavioral: Positive  for depression and suicidal ideas.    Blood pressure 121/77, pulse 67, temperature 97.7 F (36.5 C), temperature source Oral, resp. rate 12, SpO2 100 %.There is no weight on file to calculate BMI.  General Appearance: Disheveled  Eye Sport and exercise psychologist::  Fair   Speech:  Slow  Volume:  Decreased  Mood:  Depressed  Affect:  Congruent  Thought Process:  Coherent and Intact  Orientation:  Full (Time, Place, and Person)  Thought Content:  WDL  Suicidal Thoughts:  No  Homicidal Thoughts:  No  Memory:  Immediate;   Fair Recent;   Fair Remote;   Fair  Judgement:  Poor  Insight:  Lacking  Psychomotor Activity:  EPS  Concentration:  Fair  Recall:  [employer] of Knowledge:Fair  Language: Fair  Akathisia:  Yes  Handed:  Right  AIMS (if indicated):     Assets:  Communication Skills Desire for Improvement Social Support  ADL's:  Intact  Cognition: WNL  Sleep:      Treatment Plan Summary: Daily contact with patient to assess and evaluate symptoms and progress in treatment, Medication management and Plan patient to transfer to inpatient psychiatric hospital for EPS management and mood stabilization - Diagnosis: Schizophrenia, undifferentiated; Medication Side Effects -Crisis Stabilization -Medication: Valium 56m TID for EPS; Benadryl 515mOnce for EPS; Benadryl 256mID for EPS; Prazosin 2mg42mS for PTSD; Zoloft 50mg32mly for mood stabilization -Individual Counseling  Disposition: Recommend psychiatric Inpatient admission when medically cleared.  LORD,Waylan BogaNP 08/25/2015 11:20 AM Patient seen face-to-face for psychiatric evaluation, chart reviewed and case discussed with the physician extender and developed treatment plan. Reviewed the information documented and agree with the treatment plan. MojeeCorena Pilgrim

## 2015-08-25 NOTE — ED Notes (Signed)
Pt. Noted sleeping in room. No complaints or concerns voiced. No distress or abnormal behavior noted. Will continue to monitor with security cameras. Q 15 minute rounds continue. 

## 2015-08-25 NOTE — Progress Notes (Addendum)
CM spoke with pt who confirms uninsured [employer] resident with no pcp.  CM discussed and provided written information for uninsured accepting pcps, discussed the importance of pcp vs EDP services for f/u care, www.needymeds.org, www.goodrx.com, discounted pharmacies and other [employer] such as [employer] , [employer], affordable care act, financial assistance, uninsured dental services, [place] med assist, DSS and  health department  Reviewed resources for [employer] uninsured accepting pcps like [facility]maul, family medicine at [facility] [employer] [employer] [facility], [facility], [facility], local urgent care centers, [facility], [facility] family practice, general medical clinics, family services of the [place], [facility] urgent care plus others, medication resources, [facility] out patient pharmacies and housing Pt voiced understanding and appreciation of resources provided   Provided P4[facility] contact information Pt agreed to a referral Cm completed referral Pt to be contact by [facility] clinical liason  Entered in d/c instructions Please use the resources provided to you in emergency room by case manager to assist with doctor for follow up A referral for you has been sent to [facility] if you have not received a call in 3 days you may contact them Call Scherry Ran at [phone] Tuesday-Friday [email or web address].[email or web address] These [place] uninsured resources provide possible primary care providers, resources for discounted medications, housing, dental resources, affordable care act information, plus other resources for [facility]

## 2015-08-25 NOTE — Progress Notes (Signed)
Pt A & O X 3. Denied SI, HI, AVH and pain when assessed. Motor retardation (slow &stiff gait) observed throughout this shift but improved with scheduled Valium and Benadryl. Pt compliant with medications and cooperative with unit routines. Continued emotional support, availability and encouragement provided to pt. Safety maintained on Q 15 minutes checks as ordered without outburst or physical distress to report at this time.

## 2015-08-26 DIAGNOSIS — F4310 Post-traumatic stress disorder, unspecified: Secondary | ICD-10-CM

## 2015-08-26 DIAGNOSIS — G2402 Drug induced acute dystonia: Secondary | ICD-10-CM | POA: Diagnosis not present

## 2015-08-26 DIAGNOSIS — F203 Undifferentiated schizophrenia: Secondary | ICD-10-CM | POA: Diagnosis not present

## 2015-08-26 MED ORDER — PRAZOSIN HCL 5 MG PO CAPS
5.0000 mg | ORAL_CAPSULE | Freq: Every day | ORAL | Status: DC
  Administered 2015-08-26: 5 mg via ORAL
  Filled 2015-08-26 (×2): qty 1

## 2015-08-26 NOTE — ED Notes (Signed)
Pt sleeping at present, no distress noted, calm & cooperative.  AAO x 3, monitoring for safety, Q 15 min checks in effect.

## 2015-08-26 NOTE — Consult Note (Signed)
Psychiatric Specialty Exam: Physical Exam  ROS  Blood pressure 130/80, pulse 69, temperature 98.2 F (36.8 C), temperature source Oral, resp. rate 16, SpO2 100 %.There is no weight on file to calculate BMI.  General Appearance: casual  Eye Contact:: Fair  Speech: Slow  Volume: Decreased  Mood: Depressed  Affect: Congruent  Thought Process: Coherent and Intact  Orientation: Full (Time, Place, and Person)  Thought Content: WDL  Suicidal Thoughts: No  Homicidal Thoughts: No  Memory: Immediate; Fair Recent; Fair Remote; Fair  Judgement: Poor  Insight: Lacking  Psychomotor Activity: EPS, much improved today  Concentration: Fair  Recall: [employer] of Knowledge:Fair  Language: Fair  Akathisia:no  Handed: Right  AIMS (if indicated):    Assets: Communication Skills Desire for Improvement Social Support  ADL's: Intact  Cognition: WNL         Patient was seen in his room this morning and have been seen walking the hallway without difficulty.  His dystonia has improved  Since he started taking medications for that.  Patient reports less Muscle stiffness but reports poor night sleep.  Patient speaks slowly with a decreased volume.  Patient denies SI/HI/AVH.   Neuroleptic induced acute dystonia   Plan: Seek admission placement, increase Prazosin to 2 mg po at bed time. Dahlia Byes   PMHNP-BC Patient seen face-to-face for psychiatric evaluation, chart reviewed and case discussed with the physician extender and developed treatment plan. Reviewed the information documented and agree with the treatment plan. Thedore Mins, MD

## 2015-08-26 NOTE — Progress Notes (Signed)
Pt seen by [facility] staff Stacy Pt given OC application and resources for [facility] and directed him to [facility] services of the [employer]

## 2015-08-26 NOTE — BH Assessment (Signed)
[facility] Assessment Progress Note  The following facilities have been contacted to seek placement for this pt, with results as noted:  Beds available, information sent, decision pending:  [facility]   At capacity:  [facility]   Nyisha Clippard, MA Triage Specialist [phone]     

## 2015-08-27 ENCOUNTER — Encounter (HOSPITAL_COMMUNITY): Payer: Self-pay

## 2015-08-27 ENCOUNTER — Inpatient Hospital Stay (HOSPITAL_COMMUNITY)
Admission: AD | Admit: 2015-08-27 | Discharge: 2015-09-01 | DRG: 885 | Disposition: A | Discharge status: Home / Self Care | Payer: Federal, State, Local not specified - Other | Source: Intra-hospital | Attending: Psychiatry | Admitting: Psychiatry

## 2015-08-27 DIAGNOSIS — T43505A Adverse effect of unspecified antipsychotics and neuroleptics, initial encounter: Secondary | ICD-10-CM

## 2015-08-27 DIAGNOSIS — F201 Disorganized schizophrenia: Secondary | ICD-10-CM | POA: Insufficient documentation

## 2015-08-27 DIAGNOSIS — F209 Schizophrenia, unspecified: Principal | ICD-10-CM | POA: Diagnosis present

## 2015-08-27 DIAGNOSIS — F251 Schizoaffective disorder, depressive type: Secondary | ICD-10-CM | POA: Diagnosis present

## 2015-08-27 DIAGNOSIS — F4310 Post-traumatic stress disorder, unspecified: Secondary | ICD-10-CM | POA: Diagnosis not present

## 2015-08-27 DIAGNOSIS — F17210 Nicotine dependence, cigarettes, uncomplicated: Secondary | ICD-10-CM | POA: Diagnosis present

## 2015-08-27 DIAGNOSIS — G2402 Drug induced acute dystonia: Secondary | ICD-10-CM | POA: Diagnosis present

## 2015-08-27 DIAGNOSIS — F17200 Nicotine dependence, unspecified, uncomplicated: Secondary | ICD-10-CM | POA: Diagnosis not present

## 2015-08-27 MED ORDER — ALUM & MAG HYDROXIDE-SIMETH 200-200-20 MG/5ML PO SUSP: 30.0000 mL | ORAL | Status: DC | PRN

## 2015-08-27 MED ORDER — BENZTROPINE MESYLATE 0.5 MG PO TABS
0.5000 mg | ORAL_TABLET | Freq: Two times a day (BID) | ORAL | Status: DC
  Administered 2015-08-27 – 2015-09-01 (×10): 0.5 mg via ORAL
  Filled 2015-08-27 (×3): qty 1
  Filled 2015-08-27: qty 14
  Filled 2015-08-27: qty 1
  Filled 2015-08-27: qty 14
  Filled 2015-08-27 (×10): qty 1

## 2015-08-27 MED ORDER — ACETAMINOPHEN 325 MG PO TABS: 650.0000 mg | ORAL_TABLET | Freq: Four times a day (QID) | ORAL | Status: DC | PRN

## 2015-08-27 MED ORDER — MAGNESIUM HYDROXIDE 400 MG/5ML PO SUSP
30.0000 mL | Freq: Every day | ORAL | Status: DC | PRN
Start: 2015-08-27 — End: 2015-09-01

## 2015-08-27 MED ORDER — SERTRALINE HCL 50 MG PO TABS
50.0000 mg | ORAL_TABLET | Freq: Every day | ORAL | Status: DC
  Administered 2015-08-28 – 2015-09-01 (×5): 50 mg via ORAL
  Filled 2015-08-27 (×5): qty 1
  Filled 2015-08-27: qty 7
  Filled 2015-08-27 (×2): qty 1

## 2015-08-27 MED ORDER — TRAZODONE HCL 50 MG PO TABS
50.0000 mg | ORAL_TABLET | Freq: Every evening | ORAL | Status: DC | PRN
  Administered 2015-08-28 – 2015-08-31 (×5): 50 mg via ORAL
  Filled 2015-08-27: qty 1
  Filled 2015-08-27: qty 14
  Filled 2015-08-27 (×5): qty 1
  Filled 2015-08-27: qty 14
  Filled 2015-08-27 (×7): qty 1

## 2015-08-27 MED ORDER — HYDROXYZINE HCL 25 MG PO TABS
25.0000 mg | ORAL_TABLET | Freq: Four times a day (QID) | ORAL | Status: DC | PRN
  Administered 2015-08-27 – 2015-08-29 (×4): 25 mg via ORAL
  Filled 2015-08-27 (×2): qty 1
  Filled 2015-08-27: qty 10
  Filled 2015-08-27 (×2): qty 1

## 2015-08-27 MED ORDER — PRAZOSIN HCL 2 MG PO CAPS
2.0000 mg | ORAL_CAPSULE | Freq: Every day | ORAL | Status: DC
  Administered 2015-08-27 – 2015-08-31 (×5): 2 mg via ORAL
  Filled 2015-08-27: qty 1
  Filled 2015-08-27: qty 7
  Filled 2015-08-27 (×3): qty 1
  Filled 2015-08-27: qty 2
  Filled 2015-08-27 (×2): qty 1

## 2015-08-27 NOTE — Progress Notes (Signed)
D: Pt denies SI/HI/AVH. Pt is pleasant and cooperative. Pt stated he was feeling a little better, pt stayed in his room most of the evening.   A: Pt was offered support and encouragement. Pt was given scheduled medications. Pt was encourage to attend groups. Q 15 minute checks were done for safety.   R: Pt is taking medication. Pt has no complaints.Pt receptive to treatment and safety maintained on unit.

## 2015-08-27 NOTE — BH Assessment (Signed)
BHH Assessment Progress Note  Per Thedore Mins, MD, this pt requires psychiatric hospitalization at this time.  [name], RN, [facility] has assigned pt to [facility] Rm 502-2.  Pt has signed Voluntary Admission and Consent for Treatment, as well as Consent to Release Information to his attorney, [name] at the [employer], and signed forms have been faxed to [facility].  Pt's nurse, Kendal Hymen, has been notified, and agrees to send original paperwork along with pt via Juel Burrow, and to call report to [phone].  Doylene Canning, MA Triage Specialist [phone]

## 2015-08-27 NOTE — Progress Notes (Signed)
Called to get admission orders from NP, she stated to defer to evening provider.  Report given to on coming nurse that he needs order.  Win has been pleasant and cooperative.  Playing cards in the day room with peers.

## 2015-08-27 NOTE — Plan of Care (Signed)
Problem: Alteration in mood Goal: LTG-Patient reports reduction in suicidal thoughts (Patient reports reduction in suicidal thoughts and is able to verbalize a safety plan for whenever patient is feeling suicidal)  Outcome: Progressing Pt denies SI at this time     
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
## 2015-08-27 NOTE — Tx Team (Signed)
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  The patient and family have been given the opportunity to ask questions and make suggestions.  Norm Parcel Ravneet Spilker 08/27/2015, 6:04 PM
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
## 2015-08-27 NOTE — Progress Notes (Signed)
Adult Psychoeducational Group Note  Date:  08/27/2015 Time:  8:15 PM  Group Topic/Focus:  Wrap-Up Group:   The focus of this group is to help patients review their daily goal of treatment and discuss progress on daily workbooks.  Participation Level:  Did Not Attend  Pt was encouraged, however chose not to attend wrap-up group.    Cleotilde Neer 08/27/2015, 8:50 PM

## 2015-08-28 ENCOUNTER — Encounter (HOSPITAL_COMMUNITY): Payer: Self-pay | Admitting: Psychiatry

## 2015-08-28 DIAGNOSIS — F17200 Nicotine dependence, unspecified, uncomplicated: Secondary | ICD-10-CM

## 2015-08-28 DIAGNOSIS — F4310 Post-traumatic stress disorder, unspecified: Secondary | ICD-10-CM

## 2015-08-28 DIAGNOSIS — G2402 Drug induced acute dystonia: Secondary | ICD-10-CM

## 2015-08-28 DIAGNOSIS — F209 Schizophrenia, unspecified: Principal | ICD-10-CM

## 2015-08-28 DIAGNOSIS — T43505A Adverse effect of unspecified antipsychotics and neuroleptics, initial encounter: Secondary | ICD-10-CM

## 2015-08-28 MED ORDER — LORAZEPAM 2 MG/ML IJ SOLN: 1.0000 mg | Freq: Four times a day (QID) | INTRAMUSCULAR | Status: DC | PRN

## 2015-08-28 MED ORDER — LORAZEPAM 1 MG PO TABS
1.0000 mg | ORAL_TABLET | Freq: Four times a day (QID) | ORAL | Status: DC | PRN
  Administered 2015-08-28 – 2015-08-29 (×2): 1 mg via ORAL
  Filled 2015-08-28 (×2): qty 1
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
## 2015-08-28 NOTE — Plan of Care (Signed)
Problem: Consults Goal: Depression Patient Education See Patient Education Module for education specifics.  Outcome: Progressing Nurse discussed depression/coping skills with patient.        
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
## 2015-08-28 NOTE — Progress Notes (Signed)
D:  Patient's self inventory sheet, patient has fair sleep, sleep medication is helpful.  Good appetite, normal energy level, good concentration.  Denied depression, hopeless and anxiety.  Denied withdrawals.  Denied SI.  Denied physical problems.  Denied pain.  Goal is to stay humble.  Plans to be himself.  No discharge plans. A:  Medications administered per MD orders.  Emotional support and encouragement given patient. R:  Denied SI and HI, contracts for safety.  Denied A/V hallucinations.  Safety maintained with 15 minute checks.

## 2015-08-28 NOTE — [facility] Group Notes (Signed)
[facility] LCSW Group Therapy  08/28/2015 1:15 pm  Type of Therapy: Process Group Therapy  Participation Level:  Active  Participation Quality:  Appropriate  Affect:  Flat  Cognitive:  Oriented  Insight:  Improving  Engagement in Group:  Limited  Engagement in Therapy:  Limited  Modes of Intervention:  Activity, Clarification, Education, Problem-solving and Support  Summary of Progress/Problems: Today's group addressed the issue of overcoming obstacles.  Patients were asked to identify their biggest obstacle post d/c that stands in the way of their on-going success, and then problem solve as to how to manage this. Stayed the entire time, engaged throughout.  Difficulty reading social cues as he responded to everyone with a comment or suggestion or story about himself, and even when others were engaged in conversation and not listening, would continue to talk.  Lots of stories about weed.  Needs to be cut off and redirected on a regular basis.  Others were patient and allowed him to talk.  He shared that he feels lost since his mother died 7 years ago, especially now around anniversary time.  Daryel Gerald B 08/28/2015   3:09 PM

## 2015-08-28 NOTE — [facility] Group Notes (Signed)
The focus of this group is to educate the patient on the purpose and policies of crisis stabilization and provide a format to answer questions about their admission.  The group details unit policies and expectations of patients while admitted.  Patient did not attend 0900 nurse education orientation group this morning.  Patient stayed in bed.   
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
## 2015-08-28 NOTE — [facility] Suicide Risk Assessment (Signed)
[facility] Admission Suicide Risk Assessment   Nursing information obtained from:    Demographic factors:    Current Mental Status:    Loss Factors:    Historical Factors:    Risk Reduction Factors:     Total Time spent with patient: 30 minutes Principal Problem: Schizophrenia (HCC) Diagnosis:   Patient Active Problem List   Diagnosis Date Noted  . 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 Posttraumatic stress disorder [F43.10] 08/05/2015  . Schizophrenia (HCC) [F20.9]   . Tobacco use disorder [F17.200]    Subjective Data: Please see H&P.   Continued Clinical Symptoms:  Alcohol Use Disorder Identification Test Final Score (AUDIT): 0 The "Alcohol Use Disorders Identification Test", Guidelines for Use in Primary Care, Second Edition.  World Science writer [facility]). Score between 0-7:  no or low risk or alcohol related problems. Score between 8-15:  moderate risk of alcohol related problems. Score between 16-19:  high risk of alcohol related problems. Score 20 or above:  warrants further diagnostic evaluation for alcohol dependence and treatment.   CLINICAL FACTORS:   Schizophrenia:   Less than 88 years old   Musculoskeletal: Strength & Muscle Tone: within normal limits Gait & Station: normal Patient leans: N/A  Psychiatric Specialty Exam: Review of Systems  Constitutional: Positive for malaise/fatigue.  Musculoskeletal: Positive for myalgias.  Neurological: Positive for tremors.  Psychiatric/Behavioral: Positive for depression and hallucinations. The patient is nervous/anxious.   All other systems reviewed and are negative.   Blood pressure 129/56, pulse 75, temperature 98.4 F (36.9 C), temperature source Oral, resp. rate 20, height 6' (1.829 m), weight 76.204 kg (168 lb), SpO2 100 %.Body mass index is 22.78 kg/(m^2).              Please see H&P.                                           COGNITIVE FEATURES THAT CONTRIBUTE TO  RISK:  Closed-mindedness, Polarized thinking and Thought constriction (tunnel vision)    SUICIDE RISK:   Mild:  Suicidal ideation of limited frequency, intensity, duration, and specificity.  There are no identifiable plans, no associated intent, mild dysphoria and related symptoms, good self-control (both objective and subjective assessment), few other risk factors, and identifiable protective factors, including available and accessible social support.  PLAN OF CARE: Please see H&P.   I certify that inpatient services furnished can reasonably be expected to improve the patient's condition.   Mistina Coatney, MD 08/28/2015, 12:00 PM

## 2015-08-28 NOTE — Progress Notes (Signed)
Adult Psychoeducational Group Note  Date:  08/28/2015 Time:  8:40 PM  Group Topic/Focus:  Wrap-Up Group:   The focus of this group is to help patients review their daily goal of treatment and discuss progress on daily workbooks.  Participation Level:  Active  Participation Quality:  Appropriate and Attentive  Affect:  Appropriate  Cognitive:  Appropriate  Insight: Appropriate and Good  Engagement in Group:  Engaged  Modes of Intervention:  Discussion  Additional Comments:  Pt mentioned he is feeling better then he was yesterday when he felt like he was in zombie mode. Pt goal for tomorrow is to have a better day then today and stay humble.  Merlinda Frederick 08/28/2015, 8:40 PM

## 2015-08-28 NOTE — [facility] Suicide Risk Assessment (Signed)
[facility] INPATIENT:  Family/Significant Other Suicide Prevention Education  Suicide Prevention Education:  Education Completed; Garald Rhew, brother, [phone] Concha Norway has been identified by the patient as the family member/significant other with whom the patient will be residing, and identified as the person(s) who will aid the patient in the event of a mental health crisis (suicidal ideations/suicide attempt).  With written consent from the patient, the family member/significant other has been provided the following suicide prevention education, prior to the and/or following the discharge of the patient.  The suicide prevention education provided includes the following:  Suicide risk factors  Suicide prevention and interventions  National Suicide Hotline telephone number  [facility] assessment telephone number  [facility] Emergency Assistance 911  [facility] and/or Residential Mobile Crisis Unit telephone number  Request made of family/significant other to:  Remove weapons (e.g., guns, rifles, knives), all items previously/currently identified as safety concern.    Remove drugs/medications (over-the-counter, prescriptions, illicit drugs), all items previously/currently identified as a safety concern.  The family member/significant other verbalizes understanding of the suicide prevention education information provided.  The family member/significant other agrees to remove the items of safety concern listed above.  Daryel Gerald B 08/28/2015, 4:00 PM

## 2015-08-29 DIAGNOSIS — F201 Disorganized schizophrenia: Secondary | ICD-10-CM

## 2015-08-29 LAB — LIPID PANEL
CHOLESTEROL: 211 mg/dL — AB (ref 0–200)
HDL: 63 mg/dL (ref >40)
LDL Cholesterol: 106 mg/dL — ABNORMAL HIGH (ref 0–99)
Total CHOL/HDL Ratio: 3.3 RATIO
Triglycerides: 212 mg/dL — ABNORMAL HIGH (ref <150)
VLDL: 42 mg/dL — ABNORMAL HIGH (ref 0–40)

## 2015-08-29 LAB — TSH: TSH: 2.699 u[IU]/mL (ref 0.350–4.500)

## 2015-08-29 NOTE — Progress Notes (Signed)
DAR NOTE: Patient is calm, pleasant and cooperative. Denies pain, auditory and visual hallucinations.  Rates depression at 0, hopelessness at 0, and anxiety at 0.  Describe energy level as normal and concentration as good.  Maintained on routine safety checks.  Medications given as prescribed.  Support and encouragement offered as needed.  Attended group and participated.  States goal for today is "get better and stay humble."  Patient observed socializing with peers in the dayroom.  Offered no complaint.

## 2015-08-29 NOTE — [facility] Group Notes (Signed)
[facility] LCSW Group Therapy  08/29/2015  1:05 PM  Type of Therapy:  Group therapy  Participation Level:  Active  Participation Quality:  Attentive  Affect:  Flat  Cognitive:  Oriented  Insight:  Limited  Engagement in Therapy:  Limited  Modes of Intervention:  Discussion, Socialization  Summary of Progress/Problems:  Chaplain was here to lead a group on themes of hope and courage."Hope is basketball.  It was my first love.  Even before girls.  Even when I feel down, I feel good when I am able to shot the rock."  As yesterday, commented after others had spoken with varying degrees of relevence.  Redirection, ignoring again required.  Later, cited the story of another patient  In group yesterday as a moving experience that gave him hope.  Daryel Gerald B 08/29/2015 1:28 PM

## 2015-08-29 NOTE — Progress Notes (Signed)
[facility] MD Progress Note  08/29/2015 11:44 AM Carlos [name]  MRN:  [id number] Subjective: Pt states " I feel better today.'  Objective:Carlos [name] is a 25 y.o. AA male, who is single , about to start a job , lives with his twin brother in [place] , has a hx of schizophrenia , PTSD , who presented to [facility] with side effects to his recent IM invega /depakote PO . Patient seen and chart reviewed.Discussed patient with treatment team.  Pt today seen as calm , denies any new concerns about his medications. He reports that he is able to move around better, his tremors have subsided. He does not have a lot of muscle stiffness anymore. Pt denies any AH today - he is not too focussed on talking about his mother today . Her death anniversary was two days ago , which resulted in his admission this time , along with his ADRs to [facility] IM. Per staff - pt is tolerating his medications well, no disruptive issues noted on the unit.     Principal Problem: Schizophrenia (HCC) Diagnosis:   Patient Active Problem List   Diagnosis Date Noted  . 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 Posttraumatic stress disorder [F43.10] 08/05/2015  . Schizophrenia (HCC) [F20.9]   . Tobacco use disorder [F17.200]    Total Time spent with patient: 30 minutes  Past Psychiatric History: Pt has a hx of schizophrenia , PTSD - has had several admissions to inpatient facilities like [facility], [facility], [facility]. Pt denies past hx of suicide attempts  Past Medical History:  Past Medical History  Diagnosis Date  . Psychotic disorder   . PTSD (post-traumatic stress disorder)   . Depression    Family History:  Family History  Problem Relation Age of Onset  . Mental illness Neg Hx    Family Psychiatric  History: Pt denies hx of mental illness in family. Social History: Pt is single , about to start a job , lives with twin brother in [place], went up to college.  History  Alcohol Use No     History  Drug Use  . Yes   . Special: Cocaine, Marijuana, [name], IV    Social History   Social History  . Marital Status: Single    Spouse Name: N/A  . Number of Children: N/A  . Years of Education: N/A   Social History Main Topics  . Smoking status: Current Every Day Smoker -- 3.00 packs/day    Types: Cigarettes  . Smokeless tobacco: None  . Alcohol Use: No  . Drug Use: Yes    Special: Cocaine, Marijuana, [name], IV  . Sexual Activity: Yes    Birth Control/ Protection: Condom   Other Topics Concern  . None   Social History Narrative   Additional Social History:    Pain Medications: see chart Prescriptions: see chart  Over the Counter: see chart History of alcohol / drug use?: Yes Longest period of sobriety (when/how long): "I don't know" Negative Consequences of Use: Financial Withdrawal Symptoms: Other (Comment) (None-"I just do pot three times per week with my brother and cousin") Name of Substance 1: THC 1 - Age of First Use: Unknown 1 - Amount (size/oz): 1 joint 1 - Frequency: three times per week 1 - Duration: years 1 - Last Use / Amount: "few days ago"                  Sleep: Fair  Appetite:  Fair  Current Medications: Current Facility-Administered  Medications  Medication Dose Route Frequency Provider Last Rate Last Dose  . acetaminophen (TYLENOL) tablet 650 mg  650 mg Oral Q6H PRN Kerry Hough, PA-C      . alum & mag hydroxide-simeth (MAALOX/MYLANTA) 200-200-20 MG/5ML suspension 30 mL  30 mL Oral Q4H PRN Kerry Hough, PA-C      . benztropine (COGENTIN) tablet 0.5 mg  0.5 mg Oral BID Kerry Hough, PA-C   0.5 mg at 08/29/15 0948  . hydrOXYzine (ATARAX/VISTARIL) tablet 25 mg  25 mg Oral Q6H PRN Kerry Hough, PA-C   25 mg at 08/28/15 2121  . LORazepam (ATIVAN) tablet 1 mg  1 mg Oral Q6H PRN Jomarie Longs, MD   1 mg at 08/28/15 2121   Or  . LORazepam (ATIVAN) injection 1 mg  1 mg Intramuscular Q6H PRN Jomarie Longs, MD      . magnesium  hydroxide (MILK OF MAGNESIA) suspension 30 mL  30 mL Oral Daily PRN Kerry Hough, PA-C      . prazosin (MINIPRESS) capsule 2 mg  2 mg Oral QHS Kerry Hough, PA-C   2 mg at 08/28/15 2121  . sertraline (ZOLOFT) tablet 50 mg  50 mg Oral Daily Kerry Hough, PA-C   50 mg at 08/29/15 0947  . traZODone (DESYREL) tablet 50 mg  50 mg Oral QHS,MR X 1 Kerry Hough, PA-C   50 mg at 08/28/15 2122    Lab Results:  Results for orders placed or performed during the hospital encounter of 08/27/15 (from the past 48 hour(s))  TSH     Status: None   Collection Time: 08/29/15  6:49 AM  Result Value Ref Range   TSH 2.699 0.350 - 4.500 uIU/mL    Comment: Performed at [facility]  Lipid panel     Status: Abnormal   Collection Time: 08/29/15  6:49 AM  Result Value Ref Range   Cholesterol 211 (H) 0 - 200 mg/dL   Triglycerides 161 (H) <150 mg/dL   HDL 63 >09 mg/dL   Total CHOL/HDL Ratio 3.3 RATIO   VLDL 42 (H) 0 - 40 mg/dL   LDL Cholesterol 604 (H) 0 - 99 mg/dL    Comment:        Total Cholesterol/HDL:CHD Risk Coronary Heart Disease Risk Table                     Men   Women  1/2 Average Risk   3.4   3.3  Average Risk       5.0   4.4  2 X Average Risk   9.6   7.1  3 X Average Risk  23.4   11.0        Use the calculated Patient Ratio above and the CHD Risk Table to determine the patient's CHD Risk.        ATP III CLASSIFICATION (LDL):  <100     mg/dL   Optimal  540-981  mg/dL   Near or Above                    Optimal  130-159  mg/dL   Borderline  191-478  mg/dL   High  >295     mg/dL   Very High Performed at [facility]     Physical Findings: AIMS: Facial and Oral Movements Muscles of Facial Expression: None, normal Lips and Perioral Area: None, normal Jaw: None, normal Tongue: None, normal,Extremity Movements  Upper (arms, wrists, hands, fingers): None, normal Lower (legs, knees, ankles, toes): Minimal, Trunk Movements Neck, shoulders, hips: None,  normal, Overall Severity Severity of abnormal movements (highest score from questions above): None, normal Incapacitation due to abnormal movements: Mild Patient's awareness of abnormal movements (rate only patient's report): Aware, no distress, Dental Status Current problems with teeth and/or dentures?: No Does patient usually wear dentures?: No  CIWA:  CIWA-Ar Total: 1 COWS:  COWS Total Score: 1  Musculoskeletal: Strength & Muscle Tone: within normal limits Gait & Station: normal Patient leans: N/A  Psychiatric Specialty Exam: Review of Systems  Neurological: Positive for tremors.  Psychiatric/Behavioral: The patient is nervous/anxious.   All other systems reviewed and are negative.   Blood pressure 129/69, pulse 91, temperature 98.6 F (37 C), temperature source Oral, resp. rate 20, height 6' (1.829 m), weight 76.204 kg (168 lb), SpO2 100 %.Body mass index is 22.78 kg/(m^2).  General Appearance: Casual  Eye Contact::  Fair  Speech:  Normal Rate  Volume:  Decreased  Mood:  Anxious  Affect:  Congruent  Thought Process:  Coherent  Orientation:  Full (Time, Place, and Person)  Thought Content:  Delusions, Paranoid Ideation and Rumination  Suicidal Thoughts:  No  Homicidal Thoughts:  No  Memory:  Immediate;   Fair Recent;   Fair Remote;   Fair  Judgement:  Impaired  Insight:  Shallow  Psychomotor Activity:  Tremor  Concentration:  Fair  Recall:  [employer] of Knowledge:Fair  Language: Fair  Akathisia:  No  Handed:  Right  AIMS (if indicated):   2  Assets:  Physical Health  ADL's:  Intact  Cognition: WNL  Sleep:  Number of Hours: 6.75   Treatment Plan Summary:Patient is a 12 y old AAM , with hx of schizophrenia , PTSD , who presented after he had an adverse reaction to Invega IM and Depakote PO. Pt currently continues to have psychomotor retardation, stiffness , mild tremors of his tongue, but is progressing . Will not restart an antipsychotic medication at this time.  Will continue to monitor pt on the unit Daily contact with patient to assess and evaluate symptoms and progress in treatment, Medication management . Marland Kitchen Will continue Zoloft 50 mg po daily for affective sx. Will continue Cogentin 0.5 mg po bid for EPS. Will continue Prazosin 2 mg po qhs for nightmares. Will not restart another antipsychotic medication at this time- pt has Invega IM in his system and his AIMS - 6 ( 08/28/15) . AIMS - 2 ( 08/29/15) Will continue to monitor. Will make available PRN medications as per agitation protocol. Will continue to monitor vitals ,medication compliance and treatment side effects while patient is here.  Will monitor for medical issues as well as call consult as needed.  Reviewed labs cbc- wnl, cmp - wnl, UDS - pos for THC, BAL <5. TSH- WNL , lipid panel- elevated - will recommend diet control ,pending  hba1c, pl , EKG - wnl  CSW will start working on disposition. [facility] . Patient to participate in therapeutic milieu .    Ameshia Pewitt MD 08/29/2015, 11:44 AM

## 2015-08-29 NOTE — Progress Notes (Signed)
D: Pt denies SI/HI/AVH. Pt is pleasant and cooperative. Pt seen interacting on the unit with peers and staff. Pt stated he was feeling much better today and not feeling like a Zombie.   A: Pt was offered support and encouragement. Pt was given scheduled medications. Pt was encourage to attend groups. Q 15 minute checks were done for safety.   R:Pt attends groups and interacts well with peers and staff. Pt is taking medication. Pt has no complaints.Pt receptive to treatment and safety maintained on unit.

## 2015-08-29 NOTE — Progress Notes (Signed)
NUTRITION NOTE  Consult received for hyperlipidemia education. Per protocol, RN to provide pt with packet outlining general healthy eating/diet and hyperlipidemia specific recommendations.   No further nutrition interventions warranted at this time. Please re-consult should further issues arise.    Trenton Gammon, RD, LDN Inpatient Clinical Dietitian Pager # [phone] After hours/weekend pager # [phone]

## 2015-08-29 NOTE — Progress Notes (Signed)
Adult Psychoeducational Group Note  Date:  08/29/2015 Time:  9:23 PM  Group Topic/Focus:  Wrap-Up Group:   The focus of this group is to help patients review their daily goal of treatment and discuss progress on daily workbooks.  Participation Level:  Active  Participation Quality:  Attentive  Affect:  Appropriate  Cognitive:  Appropriate  Insight: Good  Engagement in Group:  Off Topic  Modes of Intervention:  Discussion  Additional Comments:  Pt rated is day a 10 out of 10. Pt relapse prevention goal is to pray without ceasing, exercising, utilizing his personal support and stay motivated.  Merlinda Frederick 08/29/2015, 9:23 PM

## 2015-08-29 NOTE — Plan of Care (Signed)
Problem: Alteration in mood Goal: LTG-Patient reports reduction in suicidal thoughts (Patient reports reduction in suicidal thoughts and is able to verbalize a safety plan for whenever patient is feeling suicidal)  Outcome: Progressing Pt denies SI at this time     

## 2015-08-30 LAB — HEMOGLOBIN A1C
Hgb A1c MFr Bld: 5.4 % (ref 4.8–5.6)
Mean Plasma Glucose: 108 mg/dL

## 2015-08-30 LAB — PROLACTIN: PROLACTIN: 113.1 ng/mL — AB (ref 4.0–15.2)

## 2015-08-30 NOTE — Progress Notes (Signed)
[facility] MD Progress Note  08/30/2015 6:15 PM Carlos [name]  MRN:  [id number] Subjective: Pt states " I feel better. Much better. It's a jesus thing sometimes, you know?"  Objective:Carlos [name] is a 25 y.o. AA male, who is single , about to start a job , lives with his twin brother in [place] , has a hx of schizophrenia , PTSD , who presented to [facility] with side effects to his recent IM invega /depakote PO.  Pt seen and chart reviewed. Pt is alert/oriented x4, calm, cooperative, and appropriate to situation. Pt denies suicidal/homicidal ideation and psychosis. However, he does appear to be mildly internally preoccupied and does mention some strange religious tangents about how "Jesus is in everything and that's why I'm better. That and because my roommate is just a great amazing person." Pt is improved overall in his ability to focus but we will continue to monitor closely.      Principal Problem: Schizophrenia (HCC) Diagnosis:   Patient Active Problem List   Diagnosis Date Noted  . Schizophrenia (HCC) [F20.9]     Priority: High  . 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 Posttraumatic stress disorder [F43.10] 08/05/2015  . Tobacco use disorder [F17.200]    Total Time spent with patient: 15 minutes  Past Psychiatric History: Pt has a hx of schizophrenia , PTSD - has had several admissions to inpatient facilities like [facility], [facility], old [place]. Pt denies past hx of suicide attempts  Past Medical History:  Past Medical History  Diagnosis Date  . Psychotic disorder   . PTSD (post-traumatic stress disorder)   . Depression    Family History:  Family History  Problem Relation Age of Onset  . Mental illness Neg Hx    Family Psychiatric  History: Pt denies hx of mental illness in family. Social History: Pt is single , about to start a job , lives with twin brother in [place], went up to college.  History  Alcohol Use No     History  Drug Use  . Yes  . Special: Cocaine,  Marijuana, [name], IV    Social History   Social History  . Marital Status: Single    Spouse Name: N/A  . Number of Children: N/A  . Years of Education: N/A   Social History Main Topics  . Smoking status: Current Every Day Smoker -- 3.00 packs/day    Types: Cigarettes  . Smokeless tobacco: None  . Alcohol Use: No  . Drug Use: Yes    Special: Cocaine, Marijuana, [name], IV  . Sexual Activity: Yes    Birth Control/ Protection: Condom   Other Topics Concern  . None   Social History Narrative   Additional Social History:    Pain Medications: see chart Prescriptions: see chart  Over the Counter: see chart History of alcohol / drug use?: Yes Longest period of sobriety (when/how long): "I don't know" Negative Consequences of Use: Financial Withdrawal Symptoms: Other (Comment) (None-"I just do pot three times per week with my brother and cousin") Name of Substance 1: THC 1 - Age of First Use: Unknown 1 - Amount (size/oz): 1 joint 1 - Frequency: three times per week 1 - Duration: years 1 - Last Use / Amount: "few days ago"                  Sleep: Fair  Appetite:  Fair  Current Medications: Current Facility-Administered Medications  Medication Dose Route Frequency Provider Last Rate Last Dose  . acetaminophen (TYLENOL)  tablet 650 mg  650 mg Oral Q6H PRN Kerry Hough, PA-C      . alum & mag hydroxide-simeth (MAALOX/MYLANTA) 200-200-20 MG/5ML suspension 30 mL  30 mL Oral Q4H PRN Kerry Hough, PA-C      . benztropine (COGENTIN) tablet 0.5 mg  0.5 mg Oral BID Kerry Hough, PA-C   0.5 mg at 08/30/15 1716  . hydrOXYzine (ATARAX/VISTARIL) tablet 25 mg  25 mg Oral Q6H PRN Kerry Hough, PA-C   25 mg at 08/29/15 2122  . LORazepam (ATIVAN) tablet 1 mg  1 mg Oral Q6H PRN Jomarie Longs, MD   1 mg at 08/29/15 2122   Or  . LORazepam (ATIVAN) injection 1 mg  1 mg Intramuscular Q6H PRN Jomarie Longs, MD      . magnesium hydroxide (MILK OF  MAGNESIA) suspension 30 mL  30 mL Oral Daily PRN Kerry Hough, PA-C      . prazosin (MINIPRESS) capsule 2 mg  2 mg Oral QHS Kerry Hough, PA-C   2 mg at 08/29/15 2122  . sertraline (ZOLOFT) tablet 50 mg  50 mg Oral Daily Kerry Hough, PA-C   50 mg at 08/30/15 0947  . traZODone (DESYREL) tablet 50 mg  50 mg Oral QHS,MR X 1 Kerry Hough, PA-C   50 mg at 08/29/15 2250    Lab Results:  Results for orders placed or performed during the hospital encounter of 08/27/15 (from the past 48 hour(s))  TSH     Status: None   Collection Time: 08/29/15  6:49 AM  Result Value Ref Range   TSH 2.699 0.350 - 4.500 uIU/mL    Comment: Performed at [facility]  Lipid panel     Status: Abnormal   Collection Time: 08/29/15  6:49 AM  Result Value Ref Range   Cholesterol 211 (H) 0 - 200 mg/dL   Triglycerides 409 (H) <150 mg/dL   HDL 63 >81 mg/dL   Total CHOL/HDL Ratio 3.3 RATIO   VLDL 42 (H) 0 - 40 mg/dL   LDL Cholesterol 191 (H) 0 - 99 mg/dL    Comment:        Total Cholesterol/HDL:CHD Risk Coronary Heart Disease Risk Table                     Men   Women  1/2 Average Risk   3.4   3.3  Average Risk       5.0   4.4  2 X Average Risk   9.6   7.1  3 X Average Risk  23.4   11.0        Use the calculated Patient Ratio above and the CHD Risk Table to determine the patient's CHD Risk.        ATP III CLASSIFICATION (LDL):  <100     mg/dL   Optimal  478-295  mg/dL   Near or Above                    Optimal  130-159  mg/dL   Borderline  621-308  mg/dL   High  >657     mg/dL   Very High Performed at [facility]   Hemoglobin A1c     Status: None   Collection Time: 08/29/15  6:49 AM  Result Value Ref Range   Hgb A1c MFr Bld 5.4 4.8 - 5.6 %    Comment: (NOTE)  Pre-diabetes: 5.7 - 6.4         Diabetes: >6.4         Glycemic control for adults with diabetes: <7.0    Mean Plasma Glucose 108 mg/dL    Comment: (NOTE) Performed At: [facility] [address] [place], [place] [id number] Mila Homer MD WU:[id number] Performed at [facility]   Prolactin     Status: Abnormal   Collection Time: 08/29/15  6:49 AM  Result Value Ref Range   Prolactin 113.1 (H) 4.0 - 15.2 ng/mL    Comment: (NOTE) Performed At: [facility] [address] [place], [place] [id number] Mila Homer MD VH:[id number] Performed at [facility]     Physical Findings: AIMS: Facial and Oral Movements Muscles of Facial Expression: None, normal Lips and Perioral Area: None, normal Jaw: None, normal Tongue: None, normal,Extremity Movements Upper (arms, wrists, hands, fingers): None, normal Lower (legs, knees, ankles, toes): Minimal, Trunk Movements Neck, shoulders, hips: None, normal, Overall Severity Severity of abnormal movements (highest score from questions above): Minimal Incapacitation due to abnormal movements: None, normal Patient's awareness of abnormal movements (rate only patient's report): No Awareness, Dental Status Current problems with teeth and/or dentures?: No Does patient usually wear dentures?: No  CIWA:  CIWA-Ar Total: 1 COWS:  COWS Total Score: 1  Musculoskeletal: Strength & Muscle Tone: within normal limits Gait & Station: normal Patient leans: N/A  Psychiatric Specialty Exam: Review of Systems  Neurological: Positive for tremors.  Psychiatric/Behavioral: The patient is nervous/anxious.   All other systems reviewed and are negative.   Blood pressure 107/68, pulse 108, temperature 98.2 F (36.8 C), temperature source Oral, resp. rate 16, height 6' (1.829 m), weight 76.204 kg (168 lb), SpO2 100 %.Body mass index is 22.78 kg/(m^2).  General Appearance: Casual  Eye Contact::  Fair  Speech:  Normal Rate  Volume:  Decreased  Mood:  Anxious  Affect:  Congruent  Thought Process:  Coherent  Orientation:  Full (Time, Place, and Person)  Thought Content:  Delusions, Paranoid  Ideation and Rumination although improving  Suicidal Thoughts:  No  Homicidal Thoughts:  No  Memory:  Immediate;   Fair Recent;   Fair Remote;   Fair  Judgement:  Impaired  Insight:  Shallow  Psychomotor Activity:  Tremor  Concentration:  Fair  Recall:  [employer] of Knowledge:Fair  Language: Fair  Akathisia:  No  Handed:  Right  AIMS (if indicated):   2  Assets:  Physical Health  ADL's:  Intact  Cognition: WNL  Sleep:  Number of Hours: 6.75   Treatment Plan Summary:Patient is a 64 y old AAM , with hx of schizophrenia , PTSD , who presented after he had an adverse reaction to Invega IM and Depakote PO. Today on 08/30/15, pt is improving and is more coherent although mildly tangential with religious overtones if you ask him open-ended questions.  Daily contact with patient to assess and evaluate symptoms and progress in treatment, Medication management . Marland Kitchen Will continue Zoloft 50 mg po daily for affective sx. Will continue Cogentin 0.5 mg po bid for EPS. Will continue Prazosin 2 mg po qhs for nightmares. Will not restart another antipsychotic medication at this time- pt has Invega IM in his system and his AIMS - 6 ( 08/28/15) . AIMS - 2 ( 08/29/15) Will continue to monitor. Will make available PRN medications as per agitation protocol. Will continue to monitor vitals ,medication compliance and treatment side effects  while patient is here.  Will monitor for medical issues as well as call consult as needed.  Reviewed labs cbc- wnl, cmp - wnl, UDS - pos for THC, BAL <5. TSH- WNL , lipid panel- elevated - will recommend diet control ,pending  hba1c, pl , EKG - wnl  CSW will start working on disposition. [facility] . Patient to participate in therapeutic milieu .   Beau Fanny, FNP-BC 08/30/2015, 6:15 PM I agree with assessment and plan Madie Reno A. Dub Mikes, M.D.

## 2015-08-30 NOTE — Progress Notes (Signed)
Adult Psychoeducational Group Note  Date:  08/30/2015 Time:  8:37 PM  Group Topic/Focus:  Wrap-Up Group:   The focus of this group is to help patients review their daily goal of treatment and discuss progress on daily workbooks.  Participation Level:  Minimal  Participation Quality:  Appropriate  Affect:  Appropriate  Cognitive:  Alert and Appropriate  Insight: Appropriate  Engagement in Group:  Limited  Modes of Intervention:  Socialization and Support  Additional Comments:  Patient attended and participated in group tonight. He reports that he was feeling well today. He advised that getting off the unit was a great treat.  Lita Mains [facility] 08/30/2015, 8:37 PM

## 2015-08-30 NOTE — Plan of Care (Signed)
Problem: Ineffective individual coping Goal: LTG: Patient will report a decrease in negative feelings Outcome: Progressing Pt stated he felt "like I'm back" Goal: STG: Patient will remain free from self harm Outcome: Progressing Pt safe on the unit

## 2015-08-30 NOTE — Progress Notes (Signed)
DAR NOTE: Patient presents with anxious affect and depressed mood.  Denies pain, auditory and visual hallucinations.  Rates depression at 0, hopelessness at 0, and anxiety at 0.  Describes energy level as normal and concentration as good.  Maintained on routine safety checks.  Medications given as prescribed.  Support and encouragement offered as needed.  Attended group and participated.  States goal for today is "try to make it to all groups."  Patient observed socializing with peers in the dayroom.  Offered no complaint.

## 2015-08-30 NOTE — Progress Notes (Signed)
D: Pt denies SI/HI/AVH. Pt is pleasant and cooperative. Pt very visible on unit talking to peers and staff . Pt stated he was feeling better and felt he was like his old self.   A: Pt was offered support and encouragement. Pt was given scheduled medications. Pt was encourage to attend groups. Q 15 minute checks were done for safety.   R:Pt attends groups and interacts well with peers and staff. Pt is taking medication. Pt has no complaints.Pt receptive to treatment and safety maintained on unit.

## 2015-08-30 NOTE — [facility] Group Notes (Signed)
[facility] Group Notes:  (Clinical Social Work)  08/30/2015  11:15-12:00PM  Summary of Progress/Problems:   Today's process group involved patients discussing their feelings related to being hospitalized, as well as how they can use their present feelings to create a plan for discharge and how to stay well.  There was considerable discussion about the benefit of being with others with similar problems and no longer feeling alone.  There was also discussion about boundary setting with people who are judgmental. The patient expressed that he is somewhat bored, but feels it is helping to be in the hospital.  He was very centered on analyzing his thoughts, rather than addressing his feelings, and had to be helped to differentiate these.  He was also monopolizing, often with religious discussion, but redirectable and pleasant.  Type of Therapy:  Group Therapy - Process  Participation Level:  Active  Participation Quality:  Attentive, Sharing and Supportive  Affect:  Appropriate  Cognitive:  Appropriate  Insight:  Improving  Engagement in Therapy:  Engaged  Modes of Intervention:  Exploration, Discussion  Ambrose Mantle, LCSW 08/30/2015, 1:29 PM

## 2015-08-31 DIAGNOSIS — F201 Disorganized schizophrenia: Secondary | ICD-10-CM | POA: Insufficient documentation

## 2015-08-31 NOTE — Progress Notes (Signed)
  D: Pt informed the writer that eat healthier. States that after discharge he plans to exercise, play basketball, and "try out for the [employer] a [employer] team under the [facility]".  When asked if he plans to continue with treatment, pt replied "yeah". Pt has no questions or concerns.   A:  Support and encouragement was offered. 15 min checks continued for safety.  R: Pt remains safe.

## 2015-08-31 NOTE — Progress Notes (Signed)
D: Pt presents anxious during shift assessment. Pt denies suicidal thoughts. Pt denies AVH. Pt rates depression 0/10. Anxiety 0/10. Hopeless 0/10. Pt reports fair sleep, concentration and appetite. Pt denies auditory and visual hallucinations. Pt verbalized that he's ready to discharge and plans to discharge on Monday. Pt compliant with taking meds. No adverse reaction to meds verbalized by pt.  A: Medications reviewed with pt. Medications administered as ordered per MD. Verbal support provided. Pt encouraged to attend groups. R: Pt stated goal "be humble". Pt verbalized understanding of med regimen. Pt receptive to tx.

## 2015-08-31 NOTE — Progress Notes (Signed)
[facility] MD Progress Note  08/31/2015 6:25 PM Carlos [name]  MRN:  [id number] Subjective: Pt states " I feel more clear today. I feel like I'm participating in the groups and my mind is clear."  Objective:Carlos [name] is a 25 y.o. AA male, who is single , about to start a job , lives with his twin brother in [place] , has a hx of schizophrenia , PTSD , who presented to [facility] with side effects to his recent IM invega /depakote PO.  Pt seen and chart reviewed. Pt is alert/oriented x4, calm, cooperative, and appropriate to situation. Pt denies suicidal/homicidal ideation and psychosis. However, he does continue to present with mild tangentiality, although it is improving. The religious overtones have resolved. He is focusing on the conversation better and does appear to be more lucid. He states he is ready to go home although he probably needs a few more days.     Principal Problem: Schizophrenia (HCC) Diagnosis:   Patient Active Problem List   Diagnosis Date Noted  . Schizophrenia (HCC) [F20.9]     Priority: High  . 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 Posttraumatic stress disorder [F43.10] 08/05/2015  . Tobacco use disorder [F17.200]    Total Time spent with patient: 15 minutes  Past Psychiatric History: Pt has a hx of schizophrenia , PTSD - has had several admissions to inpatient facilities like [facility], [facility], [facility] [place]. Pt denies past hx of suicide attempts  Past Medical History:  Past Medical History  Diagnosis Date  . Psychotic disorder   . PTSD (post-traumatic stress disorder)   . Depression    Family History:  Family History  Problem Relation Age of Onset  . Mental illness Neg Hx    Family Psychiatric  History: Pt denies hx of mental illness in family. Social History: Pt is single , about to start a job , lives with twin brother in [place], went up to college.  History  Alcohol Use No     History  Drug Use  . Yes   . Special: Cocaine, Marijuana, [name], IV    Social History   Social History  . Marital Status: Single    Spouse Name: N/A  . Number of Children: N/A  . Years of Education: N/A   Social History Main Topics  . Smoking status: Current Every Day Smoker -- 3.00 packs/day    Types: Cigarettes  . Smokeless tobacco: None  . Alcohol Use: No  . Drug Use: Yes    Special: Cocaine, Marijuana, [name], IV  . Sexual Activity: Yes    Birth Control/ Protection: Condom   Other Topics Concern  . None   Social History Narrative   Additional Social History:    Pain Medications: see chart Prescriptions: see chart  Over the Counter: see chart History of alcohol / drug use?: Yes Longest period of sobriety (when/how long): "I don't know" Negative Consequences of Use: Financial Withdrawal Symptoms: Other (Comment) (None-"I just do pot three times per week with my brother and cousin") Name of Substance 1: THC 1 - Age of First Use: Unknown 1 - Amount (size/oz): 1 joint 1 - Frequency: three times per week 1 - Duration: years 1 - Last Use / Amount: "few days ago"                  Sleep: Fair  Appetite:  Fair  Current Medications: Current Facility-Administered Medications  Medication Dose Route Frequency Provider Last Rate  Last Dose  . acetaminophen (TYLENOL) tablet 650 mg  650 mg Oral Q6H PRN Kerry Hough, PA-C      . alum & mag hydroxide-simeth (MAALOX/MYLANTA) 200-200-20 MG/5ML suspension 30 mL  30 mL Oral Q4H PRN Kerry Hough, PA-C      . benztropine (COGENTIN) tablet 0.5 mg  0.5 mg Oral BID Kerry Hough, PA-C   0.5 mg at 08/31/15 1718  . hydrOXYzine (ATARAX/VISTARIL) tablet 25 mg  25 mg Oral Q6H PRN Kerry Hough, PA-C   25 mg at 08/29/15 2122  . LORazepam (ATIVAN) tablet 1 mg  1 mg Oral Q6H PRN Jomarie Longs, MD   1 mg at 08/29/15 2122   Or  . LORazepam (ATIVAN) injection 1 mg  1 mg Intramuscular Q6H PRN Jomarie Longs, MD      . magnesium  hydroxide (MILK OF MAGNESIA) suspension 30 mL  30 mL Oral Daily PRN Kerry Hough, PA-C      . prazosin (MINIPRESS) capsule 2 mg  2 mg Oral QHS Kerry Hough, PA-C   2 mg at 08/30/15 2110  . sertraline (ZOLOFT) tablet 50 mg  50 mg Oral Daily Kerry Hough, PA-C   50 mg at 08/31/15 0801  . traZODone (DESYREL) tablet 50 mg  50 mg Oral QHS,MR X 1 Kerry Hough, PA-C   50 mg at 08/30/15 2110    Lab Results:  No results found for this or any previous visit (from the past 48 hour(s)).  Physical Findings: AIMS: Facial and Oral Movements Muscles of Facial Expression: None, normal Lips and Perioral Area: None, normal Jaw: None, normal Tongue: None, normal,Extremity Movements Upper (arms, wrists, hands, fingers): None, normal Lower (legs, knees, ankles, toes): Minimal, Trunk Movements Neck, shoulders, hips: None, normal, Overall Severity Severity of abnormal movements (highest score from questions above): Minimal Incapacitation due to abnormal movements: None, normal Patient's awareness of abnormal movements (rate only patient's report): No Awareness, Dental Status Current problems with teeth and/or dentures?: No Does patient usually wear dentures?: No  CIWA:  CIWA-Ar Total: 1 COWS:  COWS Total Score: 1  Musculoskeletal: Strength & Muscle Tone: within normal limits Gait & Station: normal Patient leans: N/A  Psychiatric Specialty Exam: Review of Systems  Neurological: Positive for tremors.  Psychiatric/Behavioral: The patient is nervous/anxious.   All other systems reviewed and are negative.   Blood pressure 131/76, pulse 95, temperature 97.5 F (36.4 C), temperature source Oral, resp. rate 16, height 6' (1.829 m), weight 76.204 kg (168 lb), SpO2 100 %.Body mass index is 22.78 kg/(m^2).  General Appearance: Casual  Eye Contact::  Fair  Speech:  Normal Rate  Volume:  Decreased  Mood:  Anxious  Affect:  Congruent  Thought Process:  Coherent  Orientation:  Full (Time, Place,  and Person)  Thought Content:  Delusions, Paranoid Ideation and Rumination although improving greatly on 08/31/2015   Suicidal Thoughts:  No  Homicidal Thoughts:  No  Memory:  Immediate;   Fair Recent;   Fair Remote;   Fair  Judgement:  Impaired  Insight:  Shallow  Psychomotor Activity:  Tremor  Concentration:  Fair  Recall:  [employer] of Knowledge:Fair  Language: Fair  Akathisia:  No  Handed:  Right  AIMS (if indicated):   2  Assets:  Physical Health  ADL's:  Intact  Cognition: WNL  Sleep:  Number of Hours: 6.75   Treatment Plan Summary:Patient is a 76 y old AAM , with hx of schizophrenia , PTSD ,  who presented after he had an adverse reaction to Invega IM and Depakote PO. Today on 08/30/15, pt is improving and is more coherent although mildly tangential with significant improvement since yesterday; continue regimen as below.   Daily contact with patient to assess and evaluate symptoms and progress in treatment, Medication management . Marland Kitchen Will continue Zoloft 50 mg po daily for affective sx. Will continue Cogentin 0.5 mg po bid for EPS. Will continue Prazosin 2 mg po qhs for nightmares. Will not restart another antipsychotic medication at this time- pt has Invega IM in his system and his AIMS - 6 ( 08/28/15) . AIMS - 2 ( 08/29/15) Will continue to monitor. Will make available PRN medications as per agitation protocol. Will continue to monitor vitals ,medication compliance and treatment side effects while patient is here.  Will monitor for medical issues as well as call consult as needed.  Reviewed labs cbc- wnl, cmp - wnl, UDS - pos for THC, BAL <5. TSH- WNL , lipid panel- elevated - will recommend diet control ,pending  hba1c, pl , EKG - wnl  CSW will start working on disposition. [facility] . Patient to participate in therapeutic milieu .   Beau Fanny, FNP-BC 08/31/2015, 6:25 PM I agree with assessment and plan Madie Reno A. Dub Mikes, M.D.

## 2015-08-31 NOTE — Progress Notes (Signed)
   D: When asked about his day pt stated, "I don't hear any of those things, I feel good, and I'm back to normal. I'm ready to go".  Pt was observed in the dayroom more today than previous day. Pt has no questions or concerns.    A:  Support and encouragement was offered. 15 min checks continued for safety.  R: Pt remains safe.

## 2015-08-31 NOTE — Progress Notes (Signed)
Adult Psychoeducational Group Note  Date:  08/31/2015 Time:  8:39 PM  Group Topic/Focus:  Wrap-Up Group:   The focus of this group is to help patients review their daily goal of treatment and discuss progress on daily workbooks.  Participation Level:  Active  Participation Quality:  Appropriate  Affect:  Appropriate  Cognitive:  Appropriate  Insight: Appropriate  Engagement in Group:  Engaged  Modes of Intervention:  Discussion  Additional Comments: The patient expressed that his day was great and rates his day a 10.The patient also said that his support system his family.  Octavio Manns 08/31/2015, 8:39 PM

## 2015-08-31 NOTE — [facility] Group Notes (Signed)
[facility] Group Notes:  (Clinical Social Work)  08/31/2015  [facility] Group Notes:  (Clinical Social Work)  08/31/2015  11:00AM-12:00PM  Summary of Progress/Problems:  The main focus of today's process group was to listen to a variety of genres of music and to identify that different types of music provoke different responses.  The patient then was able to identify personally what was soothing for them, as well as energizing.  The patient expressed understanding of concepts, as well as knowledge of how each type of music affected him and how this can be used at home as a wellness/recovery tool.  He was quite emotional when a song he had requested came on, left the room temporarily but did not want clinician to turn it off.  He talked quite a bit during group and was insightful.  Type of Therapy:  Music Therapy   Participation Level:  Active  Participation Quality:  Attentive and Sharing  Affect:  Blunted  Cognitive:  Oriented  Insight:  Engaged  Engagement in Therapy:  Engaged  Modes of Intervention:   Activity, Exploration  Ambrose Mantle, LCSW 08/31/2015

## 2015-09-01 MED ORDER — SERTRALINE HCL 50 MG PO TABS: 50.0000 mg | ORAL_TABLET | Freq: Every day | ORAL | Status: DC

## 2015-09-01 MED ORDER — HYDROXYZINE HCL 25 MG PO TABS: 25.0000 mg | ORAL_TABLET | Freq: Four times a day (QID) | ORAL | Status: DC | PRN

## 2015-09-01 MED ORDER — BENZTROPINE MESYLATE 0.5 MG PO TABS: 0.5000 mg | ORAL_TABLET | Freq: Two times a day (BID) | ORAL | Status: DC

## 2015-09-01 MED ORDER — PRAZOSIN HCL 2 MG PO CAPS: 2.0000 mg | ORAL_CAPSULE | Freq: Every day | ORAL | Status: DC

## 2015-09-01 MED ORDER — TRAZODONE HCL 50 MG PO TABS: 50.0000 mg | ORAL_TABLET | Freq: Every evening | ORAL | Status: DC | PRN
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
## 2015-09-01 NOTE — Progress Notes (Signed)
D:  Patient's self inventory sheet, patient sleeps good, no sleep medication given.  Good appetite, normal energy level, good concentration.  Denied depression, hopeless and anxiety.  Denied withdrawals.  Denied SI.  Denied physical problems.  Denied pain.  Goal is "to be humble" and plans to "be who I am."  Does have discharge plans. A:  Medications administered per MD orders.  Emotional support and encouragement given patient. R:  Denied SI and HI, contracts for safety.  Denied A/V hallucinations.  Safety maintained with 15 minute checks.

## 2015-09-01 NOTE — Discharge Summary (Signed)
Physician Discharge Summary Note  Patient:  Carlos [name] is an 25 y.o., male MRN:  [id number] DOB:  09-23-1990 Patient phone:  [phone] (home)  Patient address:   [address] [place] [address],  Total Time spent with patient: 30 minutes  Date of Admission:  08/27/2015 Date of Discharge: 09/01/2015  Reason for Admission: History of Present Illness:: Carlos [name] is a 25 y.o. AA male, who is single , about to start a job , lives with his twin brother in [place] , has a hx of schizophrenia , PTSD , who presented to [facility] with side effects to his recent IM invega /depakote PO .Per initial notes in EHR : " Patient was admitted to the [facility] with reported "tongue swelling" and "muscle spasms and tremors." Patient is also reporting muscle stiffness and generalized body pain. The patient was recently discharged on 08/15/15 from [place] on antipsychotic medication Hinda Glatter) and Depakote. Patient states that after his symptoms worsened within the past week, he discontinued his medication and came to the emergency department. On assessment, the patient has muscle stiffness and muscle spasms. No tongue swelling is noted at this time. The patient also reports depression and became tearful when asked about his diagnosis of PTSD. Patient reports that his childhood was "bad" and that he misses his mother who has passed away. Patient denies suicidal ideation, homicidal ideation, and auditory or visual hallucinations. Patient denies any alcohol or drug use."  Principal Problem: Schizophrenia [facility]) Discharge Diagnoses: Patient Active Problem List   Diagnosis Date Noted  . 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 Posttraumatic stress disorder [F43.10] 08/05/2015  . Schizophrenia (HCC) [F20.9]   . Tobacco use disorder [F17.200]     Past Psychiatric History: SEE ABOVE  Past Medical History:  Past Medical History   Diagnosis Date  . Psychotic disorder   . PTSD (post-traumatic stress disorder)   . Depression    History reviewed. No pertinent past surgical history. Family History:  Family History  Problem Relation Age of Onset  . Mental illness Neg Hx    Family Psychiatric  History: SEE ABOVE Social History:  History  Alcohol Use No     History  Drug Use  . Yes  . Special: Cocaine, Marijuana, [name], IV    Social History   Social History  . Marital Status: Single    Spouse Name: N/A  . Number of Children: N/A  . Years of Education: N/A   Social History Main Topics  . Smoking status: Current Every Day Smoker -- 3.00 packs/day    Types: Cigarettes  . Smokeless tobacco: None  . Alcohol Use: No  . Drug Use: Yes    Special: Cocaine, Marijuana, [name], IV  . Sexual Activity: Yes    Birth Control/ Protection: Condom   Other Topics Concern  . None   Social History Narrative    Hospital Course: Carlos [name] was admitted for Schizophrenia [facility]) ,  and crisis management.  Pt was treated discharged with the medications listed below under Medication List.  Medical problems were identified and treated as needed.  Home medications were restarted as appropriate.  Improvement was monitored by observation and Carlos [name] 's daily report of symptom reduction.  Emotional and mental status was monitored by daily self-inventory reports completed by Carlos [name] and clinical staff.         Carlos [name] was evaluated by the treatment team for stability and plans  for continued recovery upon discharge. Carlos [name] 's motivation was an integral factor for scheduling further treatment. Employment, transportation, bed availability, health status, family support, and any pending legal issues were also considered during hospital stay. Pt was offered further treatment options upon discharge including but not limited to Residential, Intensive Outpatient, and Outpatient treatment.   Carlos [name] will follow up with the services as listed below under Follow Up Information.     Upon completion of this admission the patient was both mentally and medically stable for discharge denying suicidal/homicidal ideation, auditory/visual/tactile hallucinations, delusional thoughts and paranoia.   Carlos [name] responded well to treatment with Cogentin 0.05mg , Zoloft s, and Trazodone 50 mg without adverse effects. Pt demonstrated improvement without reported or observed adverse effects to the point of stability appropriate for outpatient management. Pertinent labs include: Lipid Panel,CMP, CBC, and  Depakote <10(low), Prolactin 113.1 (high),  for which outpatient follow-up is necessary for lab recheck as mentioned below. Reviewed CBC, CMP, BAL, and UDS+ THC; all unremarkable aside from noted exceptions.   Physical Findings: AIMS: Facial and Oral Movements Muscles of Facial Expression: None, normal Lips and Perioral Area: None, normal Jaw: None, normal Tongue: None, normal,Extremity Movements Upper (arms, wrists, hands, fingers): None, normal Lower (legs, knees, ankles, toes): None, normal, Trunk Movements Neck, shoulders, hips: None, normal, Overall Severity Severity of abnormal movements (highest score from questions above): None, normal Incapacitation due to abnormal movements: None, normal Patient's awareness of abnormal movements (rate only patient's report): No Awareness, Dental Status Current problems with teeth and/or dentures?: No Does patient usually wear dentures?: No  CIWA:  CIWA-Ar Total: 1 COWS:  COWS Total Score: 1  Musculoskeletal: Strength & Muscle Tone: within normal limits Gait & Station: normal Patient leans: N/A  Psychiatric Specialty Exam: SEE SRA BY MD Review of Systems  Psychiatric/Behavioral: Negative for suicidal ideas and hallucinations. Nervous/anxious: stable.   All other systems reviewed and are negative.   Blood pressure 136/75, pulse 72,  temperature 97.2 F (36.2 C), temperature source Oral, resp. rate 20, height 6' (1.829 m), weight 76.204 kg (168 lb), SpO2 100 %.Body mass index is 22.78 kg/(m^2).  Have you used any form of tobacco in the last 30 days? (Cigarettes, Smokeless Tobacco, Cigars, and/or Pipes): Yes  Has this patient used any form of tobacco in the last 30 days? (Cigarettes, Smokeless Tobacco, Cigars, and/or Pipes) , No  Metabolic Disorder Labs:  Lab Results  Component Value Date   HGBA1C 5.4 08/29/2015   MPG 108 08/29/2015   MPG 111 06/04/2014   Lab Results  Component Value Date   PROLACTIN 113.1* 08/29/2015   Lab Results  Component Value Date   CHOL 211* 08/29/2015   TRIG 212* 08/29/2015   HDL 63 08/29/2015   CHOLHDL 3.3 08/29/2015   VLDL 42* 08/29/2015   LDLCALC 106* 08/29/2015   LDLCALC 114* 06/04/2014    See Psychiatric Specialty Exam and Suicide Risk Assessment completed by Attending Physician prior to discharge.  Discharge destination:  Home  Is patient on multiple antipsychotic therapies at discharge:  No   Has Patient had three or more failed trials of antipsychotic monotherapy by history:  No  Recommended Plan for Multiple Antipsychotic Therapies: NA  Discharge Instructions    Activity as tolerated - No restrictions    Complete by:  As directed      Diet general    Complete by:  As directed             Medication List  STOP taking these medications        risperiDONE 3 MG tablet  Commonly known as:  RISPERDAL      TAKE these medications      Indication   benztropine 0.5 MG tablet  Commonly known as:  COGENTIN  Take 1 tablet (0.5 mg total) by mouth 2 (two) times daily.   Indication:  Extrapyramidal Reaction caused by Medications     hydrOXYzine 25 MG tablet  Commonly known as:  ATARAX/VISTARIL  Take 1 tablet (25 mg total) by mouth every 6 (six) hours as needed for anxiety.   Indication:  Anxiety Neurosis     prazosin 2 MG capsule  Commonly known as:  MINIPRESS   Take 1 capsule (2 mg total) by mouth at bedtime.   Indication:  High Blood Pressure     sertraline 50 MG tablet  Commonly known as:  ZOLOFT  Take 1 tablet (50 mg total) by mouth daily.   Indication:  mood stablilzation     traZODone 50 MG tablet  Commonly known as:  DESYREL  Take 1 tablet (50 mg total) by mouth at bedtime and may repeat dose one time if needed.   Indication:  Trouble Sleeping           Follow-up Information    Go to [facility].   Specialty:  Behavioral Health   Why:  Walk-in apt Mon-Fri 8a-3p. Please arrive as early as possible to be sure that you are able to be seen,   Contact information:   [address] [place] [address] [phone]       Follow-up recommendations:  Activity:  as tolerated Diet:  heart healthy Other:  follow-up with [facility]  Comments:  Take all medications as prescribed. Keep all follow-up appointments as scheduled.  Do not consume alcohol or use illegal drugs while on prescription medications. Report any adverse effects from your medications to your primary care provider promptly.  In the event of recurrent symptoms or worsening symptoms, call 911, a crisis hotline, or go to the nearest emergency department for evaluation.   Signed: Oneta Rack FNP-BC  09/01/2015, 10:52 AM

## 2015-09-01 NOTE — Progress Notes (Signed)
  [facility] Adult Case Management Discharge Plan :  Will you be returning to the same living situation after discharge:  Yes,  home At discharge, do you have transportation home?: Yes,  bus pass Do you have the ability to pay for your medications: Yes,  mental health  Release of information consent forms completed and in the chart;  Patient's signature needed at discharge.  Patient to Follow up at: Follow-up Information    Go to [facility].   Specialty:  Behavioral Health   Why:  Walk-in apt Mon-Fri 8a-3p. Please arrive as early as possible to be sure that you are able to be seen,   Contact information:   [address] [place] [address] [phone]       Next level of care provider has access to [facility] Link:no  Safety Planning and Suicide Prevention discussed: Yes,  yes  Have you used any form of tobacco in the last 30 days? (Cigarettes, Smokeless Tobacco, Cigars, and/or Pipes): Yes  Has patient been referred to the Quitline?: Patient refused referral  Patient has been referred for addiction treatment: N/A  Daryel Gerald B 09/01/2015, 11:03 AM

## 2015-09-01 NOTE — Progress Notes (Signed)
Discharge Note:  Patient discharged home with bus ticket.  Patient denied SI and HI.  Denied A/V hallucinations.  Suicide prevention information given and discussed with patient who stated he understood and had no questions.  Patient stated he received all his belongings, clothing, toiletries, misc items, prescriptions, medications, black jacket, cigarettes, red tennis shoes and shoe strings, etc.  Patient received all his discharge paperwork which he will take to his next appointment.   Patient stated he appreciated all assistance received from [facility] staff.

## 2015-09-01 NOTE — BHH Suicide Risk Assessment (Signed)
[facility] Discharge Suicide Risk Assessment   Principal Problem: Schizophrenia [facility]) Discharge Diagnoses:  Patient Active Problem List   Diagnosis Date Noted  . 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 Posttraumatic stress disorder [F43.10] 08/05/2015  . Schizophrenia (HCC) [F20.9]   . Tobacco use disorder [F17.200]     Total Time spent with patient: 30 minutes  Musculoskeletal: Strength & Muscle Tone: within normal limits Gait & Station: normal Patient leans: N/A  Psychiatric Specialty Exam: Review of Systems  Psychiatric/Behavioral: Negative for depression, suicidal ideas and hallucinations. The patient is not nervous/anxious.   All other systems reviewed and are negative.   Blood pressure 131/80, pulse 98, temperature 97.2 F (36.2 C), temperature source Oral, resp. rate 20, height 6' (1.829 m), weight 76.204 kg (168 lb), SpO2 100 %.Body mass index is 22.78 kg/(m^2).  General Appearance: Casual  Eye Contact::  Fair  Speech:  Clear and Coherent409  Volume:  Normal  Mood:  Euthymic  Affect:  Appropriate  Thought Process:  Coherent  Orientation:  Full (Time, Place, and Person)  Thought Content:  WDL  Suicidal Thoughts:  No  Homicidal Thoughts:  No  Memory:  Immediate;   Fair Recent;   Fair Remote;   Fair  Judgement:  Fair  Insight:  Fair  Psychomotor Activity:  Normal  Concentration:  Fair  Recall:  [employer] of Knowledge:Fair  Language: Fair  Akathisia:  No  Handed:  Right  AIMS (if indicated):     Assets:  Desire for Improvement  Sleep:  Number of Hours: 6.75  Cognition: WNL  ADL's:  Intact   Mental Status Per Nursing Assessment::   On Admission:     Demographic Factors:  Male  Loss Factors: NA  Historical Factors: Impulsivity  Risk Reduction Factors:   Living with another person, especially a relative and Positive social support  Continued Clinical Symptoms:  Previous Psychiatric  Diagnoses and Treatments  Cognitive Features That Contribute To Risk:  None    Suicide Risk:  Minimal: No identifiable suicidal ideation.  Patients presenting with no risk factors but with morbid ruminations; may be classified as minimal risk based on the severity of the depressive symptoms  Follow-up Information    Go to [facility].   Specialty:  Behavioral Health   Why:  Walk-in apt Mon-Fri 8a-3p. Please arrive as early as possible to be sure that you are able to be seen,   Contact information:   [address] [place] [address] [phone]       Plan Of Care/Follow-up recommendations:  Activity:  No restrictions Diet:  regular Tests:  as needed Other:  Patient had adverse reaction to [facility] IM/Depakote - both these medications discontinued. Pt to follow up with [facility] to be started on an antipsychotic after he has an antispcyhotic free period of  few more days. AIMS on arrival - 6, AIMS today -0  Zeus Marquis, MD 09/01/2015, 9:19 AM

## 2016-04-24 ENCOUNTER — Encounter (HOSPITAL_COMMUNITY): Payer: Self-pay | Admitting: Emergency Medicine

## 2016-04-24 ENCOUNTER — Emergency Department (HOSPITAL_COMMUNITY)
Admission: EM | Admit: 2016-04-24 | Discharge: 2016-05-02 | Disposition: A | Discharge status: Other Acute Inpatient Hospital | Payer: Medicaid Other | Attending: Emergency Medicine | Admitting: Emergency Medicine

## 2016-04-24 DIAGNOSIS — F919 Conduct disorder, unspecified: Secondary | ICD-10-CM | POA: Diagnosis present

## 2016-04-24 DIAGNOSIS — F17210 Nicotine dependence, cigarettes, uncomplicated: Secondary | ICD-10-CM | POA: Insufficient documentation

## 2016-04-24 DIAGNOSIS — R410 Disorientation, unspecified: Secondary | ICD-10-CM | POA: Insufficient documentation

## 2016-04-24 DIAGNOSIS — F201 Disorganized schizophrenia: Secondary | ICD-10-CM | POA: Diagnosis not present

## 2016-04-24 DIAGNOSIS — F209 Schizophrenia, unspecified: Secondary | ICD-10-CM | POA: Diagnosis not present

## 2016-04-24 DIAGNOSIS — Z79899 Other long term (current) drug therapy: Secondary | ICD-10-CM | POA: Diagnosis not present

## 2016-04-24 LAB — CBC WITH DIFFERENTIAL/PLATELET
BASOS PCT: 0 %
Basophils Absolute: 0 10*3/uL (ref 0.0–0.1)
EOS ABS: 0 10*3/uL (ref 0.0–0.7)
EOS PCT: 0 %
HCT: 40.3 % (ref 39.0–52.0)
Hemoglobin: 14.4 g/dL (ref 13.0–17.0)
LYMPHS ABS: 0.9 10*3/uL (ref 0.7–4.0)
Lymphocytes Relative: 8 %
MCH: 30.1 pg (ref 26.0–34.0)
MCHC: 35.7 g/dL (ref 30.0–36.0)
MCV: 84.1 fL (ref 78.0–100.0)
MONO ABS: 0.4 10*3/uL (ref 0.1–1.0)
MONOS PCT: 4 %
NEUTROS PCT: 88 %
Neutro Abs: 9.9 10*3/uL — ABNORMAL HIGH (ref 1.7–7.7)
PLATELETS: 268 10*3/uL (ref 150–400)
RBC: 4.79 MIL/uL (ref 4.22–5.81)
RDW: 13.5 % (ref 11.5–15.5)
WBC: 11.3 10*3/uL — ABNORMAL HIGH (ref 4.0–10.5)

## 2016-04-24 LAB — COMPREHENSIVE METABOLIC PANEL
ALBUMIN: 3.9 g/dL (ref 3.5–5.0)
ALK PHOS: 38 U/L (ref 38–126)
ALT: 24 U/L (ref 17–63)
ANION GAP: 10 (ref 5–15)
AST: 25 U/L (ref 15–41)
BILIRUBIN TOTAL: 1.1 mg/dL (ref 0.3–1.2)
BUN: 10 mg/dL (ref 6–20)
CALCIUM: 9.2 mg/dL (ref 8.9–10.3)
CO2: 21 mmol/L — AB (ref 22–32)
CREATININE: 1.33 mg/dL — AB (ref 0.61–1.24)
Chloride: 111 mmol/L (ref 101–111)
GFR calc Af Amer: >60 mL/min (ref >60)
GFR calc non Af Amer: >60 mL/min (ref >60)
GLUCOSE: 114 mg/dL — AB (ref 65–99)
Potassium: 3.3 mmol/L — ABNORMAL LOW (ref 3.5–5.1)
SODIUM: 142 mmol/L (ref 135–145)
TOTAL PROTEIN: 6.5 g/dL (ref 6.5–8.1)

## 2016-04-24 LAB — ETHANOL: Alcohol, Ethyl (B): <5 mg/dL (ref <5)

## 2016-04-24 MED ORDER — ZIPRASIDONE MESYLATE 20 MG IM SOLR
10.0000 mg | Freq: Once | INTRAMUSCULAR | Status: DC
  Filled 2016-04-24: qty 20

## 2016-04-24 MED ORDER — STERILE WATER FOR INJECTION IJ SOLN
INTRAMUSCULAR | Status: AC
  Administered 2016-04-24: 1.2 mL
  Filled 2016-04-24: qty 10

## 2016-04-24 MED ORDER — LORAZEPAM 2 MG/ML IJ SOLN
2.0000 mg | Freq: Once | INTRAMUSCULAR | Status: AC
  Administered 2016-04-24: 2 mg via INTRAMUSCULAR
  Filled 2016-04-24: qty 1

## 2016-04-24 MED ORDER — ZIPRASIDONE MESYLATE 20 MG IM SOLR
10.0000 mg | Freq: Once | INTRAMUSCULAR | Status: AC
  Administered 2016-04-24: 10 mg via INTRAMUSCULAR
  Filled 2016-04-24: qty 20

## 2016-04-24 NOTE — ED Triage Notes (Addendum)
Per EMS. Pt picked up from [employer]. Pt was saying random things and harassing customers. Pt asked to leave store, but would not comply. [employer] called. Pt would only follow commands to sit or stand. Pt ran from [employer] and had to be handcuffed. Hx of PTSD and psychotic disorder. Pt has word salad during assessment and will not answer questions. Pt has abrasions on hands/forearms. Still handcuffed by [employer].
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
## 2016-04-24 NOTE — ED Notes (Signed)
Pt refused blood draws

## 2016-04-24 NOTE — ED Provider Notes (Signed)
[facility]-EMERGENCY DEPT Provider Note   CSN: [id number] Arrival date & time: 04/24/16  1847     History   Chief Complaint Chief Complaint  Patient presents with  . Manic Behavior    HPI Ein Rijo is a 25 y.o. male.  The history is provided by the patient. No language interpreter was used.  Mental Health Problem  Presenting symptoms: aggressive behavior, agitation, bizarre behavior, delusional, disorganized speech, disorganized thought process and hallucinations   Patient accompanied by:  Law enforcement Degree of incapacity (severity):  Severe Onset quality:  Unable to specify Risk factors: hx of mental illness   Pt has a history of schizophrenia  Pt was brought in by Police.  He was at [employer] general acting unusual.  Pt would not answer police questions.  Pt will not answer my questions.  Pt responding to external stimuli.   Past Medical History:  Diagnosis Date  . Depression   . Psychotic disorder   . PTSD (post-traumatic stress disorder)     Patient Active Problem List   Diagnosis Date Noted  . Disorganized schizophrenia (HCC)   . 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 Posttraumatic stress disorder 08/05/2015  . Schizophrenia (HCC)   . Tobacco use disorder     History reviewed. No pertinent surgical history.     Home Medications    Prior to Admission medications   Medication Sig Start Date End Date Taking? Authorizing Provider  benztropine (COGENTIN) 0.5 MG tablet Take 1 tablet (0.5 mg total) by mouth 2 (two) times daily. 09/01/15   Oneta Rack, NP  hydrOXYzine (ATARAX/VISTARIL) 25 MG tablet Take 1 tablet (25 mg total) by mouth every 6 (six) hours as needed for anxiety. 09/01/15   Oneta Rack, NP  prazosin (MINIPRESS) 2 MG capsule Take 1 capsule (2 mg total) by mouth at bedtime. 09/01/15   Oneta Rack, NP  sertraline (ZOLOFT) 50 MG tablet Take 1 tablet (50 mg total) by mouth daily. 09/01/15   Oneta Rack, NP  traZODone (DESYREL) 50 MG  tablet Take 1 tablet (50 mg total) by mouth at bedtime and may repeat dose one time if needed. 09/01/15   Oneta Rack, NP    Family History Family History  Problem Relation Age of Onset  . Mental illness Neg Hx     Social History Social History  Substance Use Topics  . Smoking status: Current Every Day Smoker    Packs/day: 3.00    Types: Cigarettes  . Smokeless tobacco: Not on file  . Alcohol use No     Allergies   Depakote [divalproex sodium] and Invega [paliperidone er]   Review of Systems Review of Systems  Unable to perform ROS: Psychiatric disorder  Psychiatric/Behavioral: Positive for agitation and hallucinations.     Physical Exam Updated Vital Signs BP 144/81 (BP Location: Right Arm)   Pulse 90   Temp 97.9 F (36.6 C) (Oral)   Resp 18   SpO2 100%   Physical Exam  Constitutional: He appears well-developed and well-nourished.  HENT:  Head: Normocephalic and atraumatic.  Eyes: Conjunctivae are normal.  Neck: Neck supple.  Cardiovascular: Normal rate and regular rhythm.   No murmur heard. Pulmonary/Chest: Effort normal and breath sounds normal. No respiratory distress.  Abdominal: Soft. There is no tenderness.  Musculoskeletal: He exhibits no edema.  Neurological:  Inappropriate, does not respond to questions.   Skin: Skin is warm and dry.  Psychiatric: He has a normal mood and affect.  Nursing note  and vitals reviewed.    ED Treatments / Results  Labs (all labs ordered are listed, but only abnormal results are displayed) Labs Reviewed  COMPREHENSIVE METABOLIC PANEL  ETHANOL  CBC WITH DIFFERENTIAL/PLATELET  URINE RAPID DRUG SCREEN, HOSP PERFORMED   Pt given geodon 10mg , no change in behavior.   Pt had increased agitation.  Pt given a second dosage of 10 mg.  EKG  EKG Interpretation None       Radiology No results found.  Procedures Procedures (including critical care time)  Medications Ordered in ED Medications  ziprasidone  (GEODON) injection 10 mg (not administered)     Initial Impression / Assessment and Plan / ED Course  I have reviewed the triage vital signs and the nursing notes.  Pertinent labs & imaging results that were available during my care of the patient were reviewed by me and considered in my medical decision making (see chart for details).  Clinical Course  Commitment papers initiated. Labs and urine ordered.   Pt's care turned over to Dr. Madilyn Hookees.  Final Clinical Impressions(s) / ED Diagnoses   Final diagnoses:  None    New Prescriptions New Prescriptions   No medications on file     Elson AreasLeslie K Yazlynn Birkeland, PA-C 04/24/16 2030    Tilden FossaElizabeth Rees, MD 04/27/16 [phone]

## 2016-04-25 DIAGNOSIS — F201 Disorganized schizophrenia: Secondary | ICD-10-CM

## 2016-04-25 LAB — RAPID URINE DRUG SCREEN, HOSP PERFORMED
Amphetamines: NOT DETECTED
Barbiturates: NOT DETECTED
Benzodiazepines: POSITIVE — AB
COCAINE: NOT DETECTED
OPIATES: NOT DETECTED
TETRAHYDROCANNABINOL: POSITIVE — AB

## 2016-04-25 MED ORDER — TRAZODONE HCL 50 MG PO TABS
50.0000 mg | ORAL_TABLET | Freq: Every evening | ORAL | Status: DC | PRN
  Filled 2016-04-25: qty 1

## 2016-04-25 MED ORDER — LORAZEPAM 2 MG/ML IJ SOLN
2.0000 mg | Freq: Once | INTRAMUSCULAR | Status: AC
  Administered 2016-04-25: 2 mg via INTRAMUSCULAR
  Filled 2016-04-25: qty 1

## 2016-04-25 MED ORDER — BENZTROPINE MESYLATE 1 MG PO TABS
0.5000 mg | ORAL_TABLET | Freq: Two times a day (BID) | ORAL | Status: DC
  Administered 2016-04-25 – 2016-05-01 (×4): 0.5 mg via ORAL
  Filled 2016-04-25 (×10): qty 1

## 2016-04-25 MED ORDER — ASENAPINE MALEATE 5 MG SL SUBL
5.0000 mg | SUBLINGUAL_TABLET | Freq: Two times a day (BID) | SUBLINGUAL | Status: DC
  Filled 2016-04-25 (×8): qty 1

## 2016-04-25 MED ORDER — POTASSIUM CHLORIDE CRYS ER 10 MEQ PO TBCR
10.0000 meq | EXTENDED_RELEASE_TABLET | Freq: Once | ORAL | Status: AC
  Administered 2016-04-25: 10 meq via ORAL
  Filled 2016-04-25: qty 1

## 2016-04-25 MED ORDER — SERTRALINE HCL 50 MG PO TABS
50.0000 mg | ORAL_TABLET | Freq: Every day | ORAL | Status: DC
  Filled 2016-04-25: qty 1

## 2016-04-25 MED ORDER — STERILE WATER FOR INJECTION IJ SOLN
INTRAMUSCULAR | Status: AC
  Administered 2016-04-25: 10 mL
  Filled 2016-04-25: qty 10

## 2016-04-25 MED ORDER — HYDROXYZINE HCL 25 MG PO TABS: 25.0000 mg | ORAL_TABLET | Freq: Four times a day (QID) | ORAL | Status: DC | PRN

## 2016-04-25 MED ORDER — TRAZODONE HCL 100 MG PO TABS
100.0000 mg | ORAL_TABLET | Freq: Every day | ORAL | Status: DC
  Administered 2016-04-25 – 2016-05-01 (×3): 100 mg via ORAL
  Filled 2016-04-25 (×5): qty 1

## 2016-04-25 MED ORDER — ZIPRASIDONE MESYLATE 20 MG IM SOLR
20.0000 mg | Freq: Once | INTRAMUSCULAR | Status: AC
  Administered 2016-04-25: 20 mg via INTRAMUSCULAR
  Filled 2016-04-25: qty 20

## 2016-04-25 MED ORDER — PRAZOSIN HCL 2 MG PO CAPS
2.0000 mg | ORAL_CAPSULE | Freq: Every day | ORAL | Status: DC
  Administered 2016-04-25: 2 mg via ORAL
  Filled 2016-04-25 (×8): qty 1

## 2016-04-25 MED ORDER — DIPHENHYDRAMINE HCL 50 MG/ML IJ SOLN
50.0000 mg | Freq: Once | INTRAMUSCULAR | Status: AC
  Administered 2016-04-25: 50 mg via INTRAMUSCULAR
  Filled 2016-04-25: qty 1

## 2016-04-25 NOTE — ED Notes (Signed)
Pt declining to eat lunch because we had "put something in it."  Pt given crackers/peanut butter/juice. Calmer. More easily redirected.

## 2016-04-25 NOTE — ED Notes (Signed)
Patient noted sleeping in room. No complaints, stable, in no acute distress. Q15 minute rounds and monitoring via Security Cameras to continue.  

## 2016-04-25 NOTE — ED Notes (Signed)
Pt declined to take medications, will try again

## 2016-04-25 NOTE — ED Notes (Addendum)
Up in hall walking, talking loudly, rambling about his father"daddy", unable to redirect.  Pt declined to take medication
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
## 2016-04-25 NOTE — ED Notes (Signed)
In the shower 

## 2016-04-25 NOTE — ED Notes (Signed)
Up in room

## 2016-04-25 NOTE — ED Notes (Signed)
Up to the bathroom 

## 2016-04-25 NOTE — BH Assessment (Signed)
Assessment completed. Consulted Charles Kober, PA-C who recommended inpatient treatment.  

## 2016-04-25 NOTE — Progress Notes (Signed)
CSW completed patient referrals to the following inpatient psych facilities:  [facility]  CSW will continue to follow patient for placement needs.  Seward SpeckLeo Jamaury Gumz [facility] Northwest[facility]W,LCAS Behavioral Health Disposition CSW [phone]

## 2016-04-25 NOTE — ED Notes (Signed)
Pt up in hall yelling, cursing, accssing staff of trying to inject "disease...clorox..." and that we were trying to poison him because some were caltholic,  Pt unable to be redirected, medicated w/ security assist

## 2016-04-25 NOTE — ED Notes (Signed)
Patient refused vitals nurse notified 

## 2016-04-25 NOTE — ED Notes (Addendum)
Patient noted sleeping in room. No complaints, stable, in no acute distress. Q15 minute rounds and monitoring via Security Cameras to continue.  

## 2016-04-25 NOTE — ED Notes (Signed)
Pt. Transferred to [facility] from ED to room 34. Report to include Situation, Background, Assessment and Recommendations from [facility] RN. Pt. Oriented to unit including Q15 minute rounds as well as the security cameras for their protection. Patient is alert and oriented, warm and dry in no acute distress. Patient denies SI, HI, and AVH. Pt. Encouraged to let me know if needs arise.

## 2016-04-25 NOTE — ED Notes (Signed)
Up tot he bathroom to shower and change scrubs 

## 2016-04-25 NOTE — ED Notes (Signed)
Report to include situation, background, assessment and recommendations from Janie Rambo RN. Patient sleeping, respirations regular and unlabored. Q15 minute rounds and security camera observation to continue.   

## 2016-04-25 NOTE — ED Provider Notes (Signed)
patinet medically cleared for TTS evaluation    Earley FavorGail Alegandro Macnaughton, NP 04/25/16 0009    Tilden FossaElizabeth Rees, MD 04/27/16 [phone]

## 2016-04-25 NOTE — ED Notes (Addendum)
Up in hall talking, cursing at times, asking "for my daddy", rambling,  difficult to redirect .will not return to room.

## 2016-04-25 NOTE — ED Notes (Signed)
Up in hall walking, talking w/ staff

## 2016-04-26 NOTE — ED Notes (Signed)
Patient refused vitals. Nurse Notified.

## 2016-04-26 NOTE — ED Notes (Signed)
Patient noted sleeping in room. No complaints, stable, in no acute distress. Q15 minute rounds and monitoring via Security Cameras to continue.  

## 2016-04-26 NOTE — BH Assessment (Signed)
Reassessment:   Writer met with patient face to face. Upon entering the room patient sts, "I don't want to talk to no social worker" and "Get out of my room because I shouldn't be here anyway ". Writer encouraged patient to participate but patient continued to refuse the reassessment. Writer exited patient's room. Writer was unable to confirm/deny suicidal thoughts, homicidal thoughts, and AVH's.  Patient was reportedly transferred to [facility] via [employer] yesterday. He was noted to be harassing customers at [employer], refusing to leave the store, and making random statements.   Today writer witnessed patient responding to internal stimuli. Speech is pressured with flight of ideas. Mood is angry. Eye contact is poor.  Patient dressed in scrubs. Affect is blunted. Motor activity is normal.

## 2016-04-26 NOTE — ED Notes (Signed)
Patient noted in room. No complaints, stable, in no acute distress. Q15 minute rounds and monitoring via Security Cameras to continue.  

## 2016-04-26 NOTE — BH Assessment (Signed)
[facility] Assessment Progress Note  Per Mojeed Akintayo, MD, this pt continues to require psychiatric hospitalization.  The following facilities have been contacted to seek placement for this pt, with results as noted:  Beds available, information sent, decision pending:  [facility]   At capacity:  [facility]   Marquett Bertoli, MA Triage Specialist [phone]     

## 2016-04-26 NOTE — Progress Notes (Signed)
Pt visible in milieu at intervals during this shift. Presents animated with inappropriate laughter and blunt affect at intervals. Irritable with pressured speech when approached for medications. Pt denies SI, HI, AVH and pain when assessed; however, pt was observed actively responding to internal stimuli as evidenced by conversations with unseen others in his room and in milieu, pt is hyperactive, tangential with flight of ideas as well. Pt continues to demand d/c, stated to writer "I'm not suppose to be here, I need to leave, why you people don't want to follow the rules like you [place], who told you I'm gay?". Pt refused all scheduled medications this shift, May NP was notified. No new orders received. Support and availability provided. Encouraged to voice concerns. Q 15 safety minutes checks maintained without physical outburst or self harm gestures thus far.

## 2016-04-26 NOTE — Progress Notes (Signed)
  ED CM left pt uninsured [place] resources in his locker #34 CM spoke with pt who [place]cp.  CM provided written information to assist pt with determining choice for uninsured accepting pcps, discussed the importance of pcp vs EDP services for f/u care, www.needymeds.org, www.goodrx.com, discounted pharmacies and other [place] resources such as [facility] , [facility], affordable care act, financial assistance, uninsured dental services, [place] med assist, DSS and  health department  Provided resources for [place] uninsured accepting pcps like [facility], family medicine at [address], [facility], [facility], local urgent care centers, [facility], [facility], general medical clinics, [facility] [place], [facility] plus others, medication resources, [facility] out patient pharmacies and housing Provided [facility] contact information  [facility] referral completed

## 2016-04-26 NOTE — ED Notes (Signed)
Patient noted in hall. No complaints, stable, in no acute distress. Q15 minute rounds and monitoring via [employer] to continue. 

## 2016-04-26 NOTE — ED Notes (Addendum)
Pt awake, alert & responsive, no distress noted, not interactive at present with staff, monitoring for safety, Q 15 min checks in effect.

## 2016-04-27 DIAGNOSIS — F201 Disorganized schizophrenia: Secondary | ICD-10-CM | POA: Diagnosis not present

## 2016-04-27 MED ORDER — LORAZEPAM 2 MG/ML IJ SOLN
2.0000 mg | Freq: Once | INTRAMUSCULAR | Status: AC
  Administered 2016-04-27: 2 mg via INTRAMUSCULAR
  Filled 2016-04-27: qty 1

## 2016-04-27 MED ORDER — CARBAMAZEPINE 200 MG PO TABS
200.0000 mg | ORAL_TABLET | Freq: Two times a day (BID) | ORAL | Status: DC
  Administered 2016-04-29 – 2016-05-02 (×5): 200 mg via ORAL
  Filled 2016-04-27 (×7): qty 1

## 2016-04-27 MED ORDER — DIPHENHYDRAMINE HCL 50 MG/ML IJ SOLN
50.0000 mg | Freq: Once | INTRAMUSCULAR | Status: AC
  Administered 2016-04-27: 50 mg via INTRAMUSCULAR
  Filled 2016-04-27: qty 1

## 2016-04-27 MED ORDER — ZIPRASIDONE MESYLATE 20 MG IM SOLR
20.0000 mg | Freq: Once | INTRAMUSCULAR | Status: AC
  Administered 2016-04-27: 20 mg via INTRAMUSCULAR
  Filled 2016-04-27: qty 20

## 2016-04-27 NOTE — BH Assessment (Addendum)
[facility] Assessment Progress Note  Per Thedore MinsMojeed Akintayo, MD, this pt requires psychiatric hospitalization at this time.  The following facilities have been contacted to seek placement for this pt, with results as noted:  Beds available, information sent, decision pending:  [facility]   Declined:  [facility] (due to pt acuity)   At capacity:  [facility]   Monque [place], [place] Triage Specialist [phone]

## 2016-04-27 NOTE — ED Notes (Signed)
Pt awake, alert & responsive, no distress noted, uncooperative but calm.  Monitoring for safety, Q 15 min checks in effect.

## 2016-04-27 NOTE — ED Notes (Signed)
Pt in restroom at present.

## 2016-04-27 NOTE — ED Notes (Signed)
Patient was awake this morning and standing at the door talking very loudly to the door.  He stayed in his room until after 9:30.  He refused all PO medications this morning.  When he did come out of his room he was very loud, hyper-verbal and difficult to re-direct.  He was very loud with the physician this morning and was saying he wasn't supposed to be here.  He was given injections of ziprasidone, diphenhydramine and lorazepam.  He fell asleep about 30 minutes later and has slept all day.
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
## 2016-04-28 MED ORDER — LORAZEPAM 2 MG/ML IJ SOLN
2.0000 mg | Freq: Once | INTRAMUSCULAR | Status: AC
Start: 2016-04-28 — End: 2016-04-28
  Administered 2016-04-28: 2 mg via INTRAMUSCULAR
  Filled 2016-04-28: qty 1

## 2016-04-28 MED ORDER — ZIPRASIDONE MESYLATE 20 MG IM SOLR
20.0000 mg | Freq: Four times a day (QID) | INTRAMUSCULAR | Status: DC | PRN
Start: 2016-04-28 — End: 2016-04-28
  Administered 2016-04-28: 20 mg via INTRAMUSCULAR
  Filled 2016-04-28: qty 20

## 2016-04-28 MED ORDER — ZIPRASIDONE MESYLATE 20 MG IM SOLR
20.0000 mg | Freq: Two times a day (BID) | INTRAMUSCULAR | Status: DC
  Administered 2016-04-29: 20 mg via INTRAMUSCULAR
  Filled 2016-04-28: qty 20

## 2016-04-28 MED ORDER — DIPHENHYDRAMINE HCL 50 MG/ML IJ SOLN
50.0000 mg | Freq: Once | INTRAMUSCULAR | Status: AC
  Administered 2016-04-28: 50 mg via INTRAMUSCULAR
  Filled 2016-04-28: qty 1

## 2016-04-28 MED ORDER — ZIPRASIDONE HCL 20 MG PO CAPS
20.0000 mg | ORAL_CAPSULE | Freq: Two times a day (BID) | ORAL | Status: DC
  Administered 2016-04-29 – 2016-05-01 (×4): 20 mg via ORAL
  Filled 2016-04-28 (×5): qty 1

## 2016-04-28 MED ORDER — ZIPRASIDONE HCL 20 MG PO CAPS
20.0000 mg | ORAL_CAPSULE | Freq: Four times a day (QID) | ORAL | Status: DC | PRN
  Filled 2016-04-28: qty 1

## 2016-04-28 MED ORDER — STERILE WATER FOR INJECTION IJ SOLN
INTRAMUSCULAR | Status: AC
  Administered 2016-04-28: 1.2 mL
  Filled 2016-04-28: qty 10

## 2016-04-28 MED ORDER — ZIPRASIDONE MESYLATE 20 MG IM SOLR
20.0000 mg | Freq: Once | INTRAMUSCULAR | Status: AC
Start: 2016-04-28 — End: 2016-04-28
  Administered 2016-04-28: 20 mg via INTRAMUSCULAR
  Filled 2016-04-28: qty 20

## 2016-04-28 NOTE — ED Provider Notes (Signed)
Pt having agitation, increasingly bizarre behavior and refusing all medications. Second opinion form completed, Pt under IVC, PRN Geodon ordered for max of 4 doses to be reassessed by psychiatry.   Lyndal Pulleyaniel Michella Detjen, MD 04/28/16 1130

## 2016-04-28 NOTE — ED Notes (Signed)
Pt awake, alert & responsive, no distress noted.  Pt standing at door, does not want staff in room.  Monitoring for safety, Q 15 min checks in effect.  Eating dinner at present.

## 2016-04-28 NOTE — ED Notes (Signed)
Pt currently in bed with head under the covers.

## 2016-04-28 NOTE — ED Notes (Signed)
Pt is in his room and not coming out, but he is agitated, pacing and cussing out someone who is not there. He was offered Geodon po ordered for agitation. He said, "You just told me to go the hell," and he refused the medication. He was given Geodon 20 mg IM a ordered prn for agitation.
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
## 2016-04-28 NOTE — ED Notes (Signed)
Pt insists that he is going home today. He refused his po medications this morning, "You gave me shots last night."

## 2016-04-28 NOTE — ED Notes (Signed)
Pt standing in the hallway at nurses station talking very loud, disturbing other pts, arguring with staff about the clock on the wall.  Unable to redirect pt back to the room.  [facility] notified, med orders given.

## 2016-04-28 NOTE — ED Notes (Signed)
Pt is staying in his room today. He appears to be guarded and paranoid.

## 2016-04-29 DIAGNOSIS — F201 Disorganized schizophrenia: Secondary | ICD-10-CM | POA: Diagnosis not present

## 2016-04-29 MED ORDER — STERILE WATER FOR INJECTION IJ SOLN
INTRAMUSCULAR | Status: AC
  Administered 2016-04-29: 1.2 mL
  Filled 2016-04-29: qty 10

## 2016-04-29 NOTE — ED Notes (Signed)
Pt c/o not feeling well.  States he is light headed and nauseated.  MD aware. Vital signs taken and stable.  EKG obtained.

## 2016-04-29 NOTE — ED Notes (Signed)
Pt awake, alert & responsive, no distress noted, calm & cooperative at present.  Monitoring for safety, Q 15 min checks in effect. 

## 2016-04-29 NOTE — ED Notes (Signed)
Pt is very delusional and appears to be responding to internal stimuli.  He is non-compliant but willingly let me give him his geodon injection.  He is very irritable
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
## 2016-04-30 MED ORDER — DIPHENHYDRAMINE HCL 50 MG/ML IJ SOLN
50.0000 mg | Freq: Once | INTRAMUSCULAR | Status: AC
  Administered 2016-04-30: 50 mg via INTRAMUSCULAR
  Filled 2016-04-30: qty 1

## 2016-04-30 MED ORDER — LORAZEPAM 2 MG/ML IJ SOLN
2.0000 mg | Freq: Once | INTRAMUSCULAR | Status: AC
  Administered 2016-04-30: 2 mg via INTRAMUSCULAR

## 2016-04-30 MED ORDER — LORAZEPAM 2 MG/ML IJ SOLN
INTRAMUSCULAR | Status: AC
  Filled 2016-04-30: qty 1

## 2016-04-30 MED ORDER — LORAZEPAM 2 MG/ML PO CONC: 2.0000 mg | Freq: Once | ORAL | Status: DC

## 2016-04-30 NOTE — ED Notes (Signed)
EMTALA form filled out in error. Pt. Not transfered.

## 2016-04-30 NOTE — ED Notes (Signed)
This patient continues to be very irritable.  He is paranoid and delusional.  He reluctantly took his geodon in apple sauce this morning.  He is responding to internal stimuli.  He has not shown any aggression and mostly stays to himself.  15 minute checks and video monitoring continue.

## 2016-04-30 NOTE — Progress Notes (Signed)
Patient listed as not having a pcp or [place].  [facility], informed patient of services there and walk in times.  EDCM also provided patient with list of pcps who accept self pay patients, list of discount pharmacies and websites needymeds.org and GoodRX.com for medication assistance, phone number to inquire about the orange card, phone number to inquire about Medicaid, phone number to inquire about the Affordable Care Act, financial resources in the community such as local churches, salvation army, urban ministries, and dental assistance for uninsured patients.    No further EDCM needs at this time.  The above information was placed in patient's belongings bag in locker #34.

## 2016-04-30 NOTE — ED Notes (Signed)
Report to include Situation, Background, Assessment, and Recommendations received from Edie RN. Patient alert and oriented, warm and dry, in no acute distress. Patient denies SI, HI, AVH and pain. Patient made aware of Q15 minute rounds and security cameras for their safety. Patient instructed to come to me with needs or concerns. 

## 2016-04-30 NOTE — ED Notes (Signed)
Patient noted in room. No complaints, stable, in no acute distress. Q15 minute rounds and monitoring via Security Cameras to continue.  
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
## 2016-04-30 NOTE — ED Notes (Signed)
Patient noted sleeping in room. No complaints, stable, in no acute distress. Q15 minute rounds and monitoring via Security Cameras to continue.  

## 2016-04-30 NOTE — ED Notes (Addendum)
Pt became very agitated after his brothers visit.  He was sitting in hallway crying stating that he didn't have anyone left and stated he is missing his mom.  He is very delusional and having flight of ideas.  He is also refusing to eat today except for one cup of apple sauce earlier.

## 2016-04-30 NOTE — Progress Notes (Addendum)
Staffed case with Asst. Director of Social Work in supervision. Spoke with NP and she states patient continues to wait for 500 hall bed as patients are being discharged. Not informed to complete [facility] referral at this time.  Carlos PaddyLaVonia Stevan [name], LCSWA [phone] ED CSW 04/30/2016 3:15 PM

## 2016-04-30 NOTE — BH Assessment (Signed)
[facility] Assessment Progress Note  The following facilities have been contacted to seek placement for this pt, with results as noted:  Beds available, information sent, decision pending:  [facility]   [facility]:  [facility]   [facility]  [facility]   Doylene Canninghomas Joyclyn [place], [place] Triage Specialist [phone]

## 2016-05-01 MED ORDER — ZIPRASIDONE MESYLATE 20 MG IM SOLR: 20.0000 mg | Freq: Two times a day (BID) | INTRAMUSCULAR | Status: DC

## 2016-05-01 MED ORDER — ZIPRASIDONE HCL 20 MG PO CAPS
40.0000 mg | ORAL_CAPSULE | Freq: Two times a day (BID) | ORAL | Status: DC
  Administered 2016-05-01 – 2016-05-02 (×2): 40 mg via ORAL
  Filled 2016-05-01 (×2): qty 2

## 2016-05-01 NOTE — BH Assessment (Signed)
TTS completed re-assessment.   Patient states that he is not supposed to be here. Patient states that "you are not following regulations of the land, nobody is" and started to make references to the Bible and the "downfall." Patient then stats "just like the Kyung RuddKennedy brothers, it was a set up." Patient references a family member by the name of Raj Januseon and states that he often helps that family member and no one helps him. Patient then started to talk to his "Dad" and asked this writer to be quiet so that he could speak with his father. Patient spoke about religion and discussed others practicing Voodoo on him. Patient then started to discuss Masonic Rituals. Patient denies SI but states that he is upset with his family for :putting me in here." Patient would not answer if he as homicidal towards anyone in his family.Patient appears to be delusional and hyper religious at this time based on his conversations. Patient states that he is taking his medications as prescribed. Patient is alert and oriented to person, place, but states that he feels that someone is changing the clock in the hallway and states that "today is the thirty first, I'm not stupid."   Patient continues to meet inpatient criteria. TTS to seek placement.     Davina PokeJoVea Makynlee Kressin, LCSW Therapeutic Triage Specialist [facility] 05/01/2016 6:23 PM

## 2016-05-01 NOTE — ED Notes (Signed)
Patient noted sleeping in room. No complaints, stable, in no acute distress. Q15 minute rounds and monitoring via Security Cameras to continue.  

## 2016-05-01 NOTE — ED Notes (Addendum)
Up to the bathroom, sandwich given.

## 2016-05-01 NOTE — ED Notes (Signed)
Up to the bathroom 

## 2016-05-01 NOTE — ED Notes (Signed)
Pt took medication w/o difficulty, after studying the pills and talking to self

## 2016-05-01 NOTE — ED Notes (Signed)
Pt encouraged to eat breakfast, applesauce and peanut butter given

## 2016-05-01 NOTE — ED Notes (Signed)
Report to include Situation, Background, Assessment, and Recommendations received from Janie Rambo RN. Patient alert and oriented, warm and dry, in no acute distress. Patient denies SI, HI, AVH and pain. Patient made aware of Q15 minute rounds and security cameras for their safety. Patient instructed to come to me with needs or concerns.  

## 2016-05-01 NOTE — ED Notes (Signed)
tts into see 

## 2016-05-01 NOTE — ED Notes (Signed)
Patient noted in room. No complaints, stable, in no acute distress. Q15 minute rounds and monitoring via Security Cameras to continue.  

## 2016-05-01 NOTE — ED Notes (Signed)
Pt declined to eat supper, sandwich/crackers/peanut better given

## 2016-05-02 ENCOUNTER — Inpatient Hospital Stay (HOSPITAL_COMMUNITY)
Admission: AD | Admit: 2016-05-02 | Discharge: 2016-05-06 | DRG: 885 | Disposition: A | Discharge status: Home / Self Care | Payer: Medicaid Other | Attending: Psychiatry | Admitting: Psychiatry

## 2016-05-02 ENCOUNTER — Encounter (HOSPITAL_COMMUNITY): Payer: Self-pay | Admitting: *Deleted

## 2016-05-02 DIAGNOSIS — Z79899 Other long term (current) drug therapy: Secondary | ICD-10-CM | POA: Diagnosis not present

## 2016-05-02 DIAGNOSIS — F1220 Cannabis dependence, uncomplicated: Secondary | ICD-10-CM | POA: Diagnosis present

## 2016-05-02 DIAGNOSIS — F4310 Post-traumatic stress disorder, unspecified: Secondary | ICD-10-CM | POA: Diagnosis not present

## 2016-05-02 DIAGNOSIS — G4700 Insomnia, unspecified: Secondary | ICD-10-CM | POA: Diagnosis present

## 2016-05-02 DIAGNOSIS — F201 Disorganized schizophrenia: Secondary | ICD-10-CM | POA: Diagnosis present

## 2016-05-02 DIAGNOSIS — F17210 Nicotine dependence, cigarettes, uncomplicated: Secondary | ICD-10-CM | POA: Diagnosis not present

## 2016-05-02 DIAGNOSIS — Z590 Homelessness: Secondary | ICD-10-CM

## 2016-05-02 DIAGNOSIS — Z818 Family history of other mental and behavioral disorders: Secondary | ICD-10-CM | POA: Diagnosis not present

## 2016-05-02 DIAGNOSIS — F17200 Nicotine dependence, unspecified, uncomplicated: Secondary | ICD-10-CM | POA: Diagnosis present

## 2016-05-02 DIAGNOSIS — F209 Schizophrenia, unspecified: Secondary | ICD-10-CM | POA: Diagnosis present

## 2016-05-02 DIAGNOSIS — F329 Major depressive disorder, single episode, unspecified: Secondary | ICD-10-CM | POA: Diagnosis present

## 2016-05-02 MED ORDER — CARBAMAZEPINE 200 MG PO TABS
200.0000 mg | ORAL_TABLET | Freq: Two times a day (BID) | ORAL | Status: DC
  Administered 2016-05-02 – 2016-05-06 (×8): 200 mg via ORAL
  Filled 2016-05-02 (×7): qty 1
  Filled 2016-05-02: qty 14
  Filled 2016-05-02 (×2): qty 1
  Filled 2016-05-02: qty 14
  Filled 2016-05-02: qty 1
  Filled 2016-05-02: qty 2

## 2016-05-02 MED ORDER — ZIPRASIDONE MESYLATE 20 MG IM SOLR
20.0000 mg | Freq: Two times a day (BID) | INTRAMUSCULAR | Status: DC
  Filled 2016-05-02 (×4): qty 20

## 2016-05-02 MED ORDER — BENZTROPINE MESYLATE 0.5 MG PO TABS
0.5000 mg | ORAL_TABLET | Freq: Two times a day (BID) | ORAL | Status: DC
  Administered 2016-05-02: 0.5 mg via ORAL
  Filled 2016-05-02: qty 1

## 2016-05-02 MED ORDER — ALUM & MAG HYDROXIDE-SIMETH 200-200-20 MG/5ML PO SUSP
30.0000 mL | ORAL | Status: DC | PRN
  Administered 2016-05-03: 30 mL via ORAL
  Filled 2016-05-02: qty 30

## 2016-05-02 MED ORDER — MAGNESIUM HYDROXIDE 400 MG/5ML PO SUSP: 30.0000 mL | Freq: Every day | ORAL | Status: DC | PRN

## 2016-05-02 MED ORDER — TRAZODONE HCL 100 MG PO TABS
100.0000 mg | ORAL_TABLET | Freq: Every day | ORAL | Status: DC
  Administered 2016-05-02 – 2016-05-05 (×3): 100 mg via ORAL
  Filled 2016-05-02 (×6): qty 1
  Filled 2016-05-02: qty 7

## 2016-05-02 MED ORDER — HYDROXYZINE HCL 25 MG PO TABS
25.0000 mg | ORAL_TABLET | Freq: Four times a day (QID) | ORAL | Status: DC | PRN
  Administered 2016-05-02: 25 mg via ORAL
  Filled 2016-05-02: qty 10
  Filled 2016-05-02: qty 1

## 2016-05-02 MED ORDER — ACETAMINOPHEN 325 MG PO TABS: 650.0000 mg | ORAL_TABLET | Freq: Four times a day (QID) | ORAL | Status: DC | PRN

## 2016-05-02 MED ORDER — BENZTROPINE MESYLATE 0.5 MG PO TABS
0.5000 mg | ORAL_TABLET | Freq: Two times a day (BID) | ORAL | Status: DC
  Administered 2016-05-02 – 2016-05-06 (×8): 0.5 mg via ORAL
  Filled 2016-05-02 (×2): qty 1
  Filled 2016-05-02: qty 14
  Filled 2016-05-02 (×5): qty 1
  Filled 2016-05-02: qty 14
  Filled 2016-05-02 (×4): qty 1

## 2016-05-02 MED ORDER — ZIPRASIDONE HCL 40 MG PO CAPS
40.0000 mg | ORAL_CAPSULE | Freq: Two times a day (BID) | ORAL | Status: DC
  Administered 2016-05-02 – 2016-05-03 (×2): 40 mg via ORAL
  Filled 2016-05-02 (×5): qty 1

## 2016-05-02 MED ORDER — PRAZOSIN HCL 2 MG PO CAPS
2.0000 mg | ORAL_CAPSULE | Freq: Every day | ORAL | Status: DC
  Administered 2016-05-02 – 2016-05-03 (×2): 2 mg via ORAL
  Filled 2016-05-02 (×5): qty 1
  Filled 2016-05-02: qty 7
  Filled 2016-05-02: qty 2

## 2016-05-02 NOTE — ED Notes (Signed)
Patient noted sleeping in room. No complaints, stable, in no acute distress. Q15 minute rounds and monitoring via Security Cameras to continue.  

## 2016-05-02 NOTE — ED Notes (Signed)
[facility], [employer] contacted for transport

## 2016-05-02 NOTE — ED Notes (Addendum)
Pt ambulatory w/o difficulty to [facility] with [facility], belongings given to [employer] 

## 2016-05-02 NOTE — ED Notes (Signed)
Up to the bathroom 

## 2016-05-02 NOTE — ED Notes (Signed)
Pt up in hall, declined to eat breakfast.  Apple sauce/crackerse/peanut butter given.  Pt encouraged to eat.  Pt denies AVH at this time, but sts that he has been thinking "about my momma."  Pt states she died in 2011 and that he "knows the church set up up..." talking about her death and the church building a new building.

## 2016-05-02 NOTE — ED Notes (Signed)
Up in room, questioning if something was in the cup of apple juice, nothing observed, pt encouraged to eat
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
## 2016-05-02 NOTE — Tx Team (Signed)
Initial Treatment Plan 05/02/2016 2:44 PM Carlos [name] YQM:[id number]    PATIENT STRESSORS: Financial difficulties Substance abuse   PATIENT STRENGTHS: Active sense of humor Communication skills   PATIENT IDENTIFIED PROBLEMS: "My family put me here, I was in the ER for ten days, why am I here"    Paranoid                 DISCHARGE CRITERIA:  Improved stabilization in mood, thinking, and/or behavior Need for constant or close observation no longer present  PRELIMINARY DISCHARGE PLAN: Return to previous living arrangement  PATIENT/FAMILY INVOLVEMENT: This treatment plan has been presented to and reviewed with the patient, Carlos FlakesDerrick Xavier Dormer, and/or family member.  The patient and family have been given the opportunity to ask questions and make suggestions.  Buford DresserForrest, Romaine Maciolek Shanta, RN 05/02/2016, 2:44 PM

## 2016-05-02 NOTE — ED Notes (Signed)
[facility] will call back when bed is ready

## 2016-05-02 NOTE — ED Notes (Signed)
Up in room talking quietly, nad, pt encouraged to eat, medicated w/o difficulty

## 2016-05-02 NOTE — ED Notes (Signed)
Patient noted sleepy in room. No complaints, stable, in no acute distress. Q15 minute rounds and monitoring via [employer] to continue.

## 2016-05-02 NOTE — ED Notes (Signed)
Police here to transport

## 2016-05-03 ENCOUNTER — Encounter (HOSPITAL_COMMUNITY): Payer: Self-pay | Admitting: Psychiatry

## 2016-05-03 DIAGNOSIS — F1220 Cannabis dependence, uncomplicated: Secondary | ICD-10-CM

## 2016-05-03 DIAGNOSIS — F17200 Nicotine dependence, unspecified, uncomplicated: Secondary | ICD-10-CM

## 2016-05-03 DIAGNOSIS — F201 Disorganized schizophrenia: Principal | ICD-10-CM

## 2016-05-03 DIAGNOSIS — F4310 Post-traumatic stress disorder, unspecified: Secondary | ICD-10-CM

## 2016-05-03 MED ORDER — ZIPRASIDONE HCL 80 MG PO CAPS
80.0000 mg | ORAL_CAPSULE | Freq: Two times a day (BID) | ORAL | Status: DC
  Administered 2016-05-03 – 2016-05-05 (×4): 80 mg via ORAL
  Filled 2016-05-03 (×10): qty 1

## 2016-05-03 MED ORDER — LORAZEPAM 2 MG/ML IJ SOLN: 1.0000 mg | Freq: Four times a day (QID) | INTRAMUSCULAR | Status: DC | PRN

## 2016-05-03 MED ORDER — OLANZAPINE 10 MG PO TABS: 10.0000 mg | ORAL_TABLET | Freq: Three times a day (TID) | ORAL | Status: DC | PRN

## 2016-05-03 MED ORDER — LORAZEPAM 1 MG PO TABS
1.0000 mg | ORAL_TABLET | Freq: Four times a day (QID) | ORAL | Status: DC | PRN
Start: 2016-05-03 — End: 2016-05-06

## 2016-05-03 MED ORDER — OLANZAPINE 10 MG IM SOLR: 10.0000 mg | Freq: Three times a day (TID) | INTRAMUSCULAR | Status: DC | PRN

## 2016-05-03 NOTE — Progress Notes (Signed)
D: Patient pleasant and cooperative this shift. Pt interacting well with peers in the milieu. Pt compliant with HS medications. Pt denies suicidal and homicidal ideations. No s/s of distress noted. A: Encourage staff/peer interaction, medication compliance, and group participation. Administer medications as ordered, maintain Q 15 minute safety checks. R: Pt with no complaints.
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
## 2016-05-03 NOTE — [facility] Group Notes (Signed)
[facility] LCSW Group Therapy  05/03/2016 1:15 pm  Type of Therapy: Process Group Therapy  Participation Level:  Active  Participation Quality:  Appropriate  Affect:  Flat  Cognitive:  Oriented  Insight:  Improving  Engagement in Group:  Limited  Engagement in Therapy:  Limited  Modes of Intervention:  Activity, Clarification, Education, Problem-solving and Support  Summary of Progress/Problems: Today's group addressed the issue of overcoming obstacles.  Patients were asked to identify their biggest obstacle post d/c that stands in the way of their on-going success, and then problem solve as to how to manage this. Stayed the entire time, engaged throughout.  A bit difficult to follow as he is tangential, but able to make meaningful contributions to the conversation.  Stated that "life" has been an obstacle for him.  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  "Seems like I've just been lost since then.  I don't get comfort like I used to from her."    Carlos [name], Carlos [name] 05/03/2016   4:29 PM

## 2016-05-03 NOTE — Progress Notes (Signed)
Patient lacks capacity to participate in disposition planning.  Macoy Rodwell ,MD Attending Psychiatrist  [facility]  

## 2016-05-03 NOTE — [facility] Suicide Risk Assessment (Signed)
[facility] Assessment   Nursing information obtained from:  Patient Demographic factors:  Male Current Mental Status:  NA Loss Factors:  NA Historical Factors:  NA Risk Reduction Factors:  Religious beliefs about death  Total Time spent with patient: 30 minutes Principal Problem: Schizophrenia (HCC) Diagnosis:   Patient Active Problem List   Diagnosis Date Noted  . 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 Posttraumatic stress disorder [F43.10] 08/05/2015  . Schizophrenia (HCC) [F20.9]   . Cannabis use disorder, severe, dependence (HCC) [F12.20]   . Tobacco use disorder [F17.200]    Subjective Data: Please see H&P.   Continued Clinical Symptoms:  Alcohol Use Disorder Identification Test Final Score (AUDIT): 0 The "Alcohol Use Disorders Identification Test", Guidelines for Use in Primary Care, Second Edition.  World Science writerHealth Organization [facility](WHO). Score between 0-7:  no or low risk or alcohol related problems. Score between 8-15:  moderate risk of alcohol related problems. Score between 16-19:  high risk of alcohol related problems. Score 20 or above:  warrants further diagnostic evaluation for alcohol dependence and treatment.   CLINICAL FACTORS:   Schizophrenia:   Less than 8944 years old Paranoid or undifferentiated type More than one psychiatric diagnosis Currently Psychotic Unstable or Poor Therapeutic Relationship Previous Psychiatric Diagnoses and Treatments   Musculoskeletal: Strength & Muscle Tone: within normal limits Gait & Station: normal Patient leans: N/A  Psychiatric Specialty Exam: Physical Exam  ROS  Blood pressure 130/73, pulse 65, temperature 98.2 F (36.8 C), temperature source Oral, resp. rate 18, height 5\' 11"  (1.803 m), weight 79.4 kg (175 lb).Body mass index is 24.41 kg/m.          Please see H&P.                                                 COGNITIVE FEATURES THAT CONTRIBUTE TO RISK:  Closed-mindedness, Polarized thinking and Thought constriction (tunnel vision)    SUICIDE RISK:   Moderate:  Frequent suicidal ideation with limited intensity, and duration, some specificity in terms of plans, no associated intent, good self-control, limited dysphoria/symptomatology, some risk factors present, and identifiable protective factors, including available and accessible social support.   PLAN OF CARE: Please see H&P.   I certify that inpatient services furnished can reasonably be expected to improve the patient's condition.  Quaniyah Bugh, MD 05/03/2016, 12:04 PM

## 2016-05-03 NOTE — Progress Notes (Signed)
DAR NOTE: Patient presents with anxious affect and depressed mood. Pt is labile and  blaming his family for not caring about his well being. Denies pain, auditory and visual hallucinations.  Rates depression at 0, hopelessness at 0, and anxiety at 0.  Maintained on routine safety checks.  Medications given as prescribed.  Support and encouragement offered as needed.    States goal for today is " up lift others".  Patient observed socializing with peers in the dayroom.  Offered no complaint.

## 2016-05-03 NOTE — Progress Notes (Signed)
Recreation Therapy Notes  INPATIENT RECREATION THERAPY ASSESSMENT  Patient Details Name: Carlos FlakesDerrick Xavier Pinson MRN: [id number] DOB: 05/19/1991 Today's Date: 05/03/2016  Patient Stressors: Family  Pt stated he was here because the police brought him because he was in a parking lot and was walking away from the police.  Pt also stated "they weren't real police".  Coping Skills:   Exercise, Talking, Music, Sports, Other (Comment) (Read Bible)  Personal Challenges: Relationships, Time Management, Trusting Others  Leisure Interests (2+):  Music - Listen, Individual - Other (Comment) (Writing poetry and philosophy)  Awareness of Community Resources:  Yes  Community Resources:  Park, Other (Comment) (Shopping center)  Current Use: Yes  Patient Strengths:  "I'mma put everything on the line, my ambition"  Patient Identified Areas of Improvement:  Time management and communicate better  Current Recreation Participation:  Once a week  Patient Goal for Hospitalization:  "To get out"  [place]  [place]idence[place]   Current [place] (including self-harm):  No  Current HI:  No  Consent to Intern Participation: N/A   Caroll RancherMarjette Stedman [place], LRT/CTRS   Caroll RancherLindsay, Micaiah Litle A 05/03/2016, 12:51 PM

## 2016-05-03 NOTE — Progress Notes (Signed)
Recreation Therapy Notes  Date: 05/03/16 Time: 1000 Location: 500 Hall Dayroom  Group Topic: Wellness  Goal Area(s) Addresses:  Patient will define components of whole wellness. Patient will verbalize benefit of whole wellness.  Behavioral Response: Engaged  Intervention: UnitedHealthBeach ball, chairs  Activity: Keep it ContractorGoing Volleyball.  Patients were arranged in a circle.  Patients were to hit a volleyball back and forth.  The beach ball could bounce off the ground but it could not roll to a stop.  LRT will count the number times the patients were able to pass the ball before it rolls to a stop on the floor.  Education: Wellness, Building control surveyorDischarge Planning.   Education Outcome: Acknowledges education/In group clarification offered/Needs additional education.   Clinical Observations/Feedback: Pt was very engaged and attentive.  Pt was social with LRT.  Pt was smiling throughout group.  Pt left early.   Caroll RancherMarjette Shourya Macpherson, LRT/CTRS     Caroll RancherLindsay, Ardella Chhim A 05/03/2016 12:24 PM

## 2016-05-03 NOTE — [facility] Group Notes (Signed)
Staff ask patient how his day was? Pt stated his day was good and lunch was great. Pt stated she should participated in group. Goal is to connect with peers.

## 2016-05-04 DIAGNOSIS — Z79899 Other long term (current) drug therapy: Secondary | ICD-10-CM

## 2016-05-04 DIAGNOSIS — F17210 Nicotine dependence, cigarettes, uncomplicated: Secondary | ICD-10-CM

## 2016-05-04 DIAGNOSIS — Z818 Family history of other mental and behavioral disorders: Secondary | ICD-10-CM

## 2016-05-04 LAB — COMPREHENSIVE METABOLIC PANEL
ALK PHOS: 41 U/L (ref 38–126)
ALT: 25 U/L (ref 17–63)
AST: 20 U/L (ref 15–41)
Albumin: 3.5 g/dL (ref 3.5–5.0)
Anion gap: 5 (ref 5–15)
BUN: 11 mg/dL (ref 6–20)
CALCIUM: 8.9 mg/dL (ref 8.9–10.3)
CO2: 26 mmol/L (ref 22–32)
CREATININE: 1.05 mg/dL (ref 0.61–1.24)
Chloride: 108 mmol/L (ref 101–111)
Glucose, Bld: 119 mg/dL — ABNORMAL HIGH (ref 65–99)
Potassium: 4.1 mmol/L (ref 3.5–5.1)
Sodium: 139 mmol/L (ref 135–145)
Total Bilirubin: 0.4 mg/dL (ref 0.3–1.2)
Total Protein: 6.2 g/dL — ABNORMAL LOW (ref 6.5–8.1)

## 2016-05-04 LAB — LIPID PANEL
CHOLESTEROL: 135 mg/dL (ref 0–200)
HDL: 56 mg/dL (ref >40)
LDL Cholesterol: 68 mg/dL (ref 0–99)
TRIGLYCERIDES: 57 mg/dL (ref <150)
Total CHOL/HDL Ratio: 2.4 RATIO
VLDL: 11 mg/dL (ref 0–40)

## 2016-05-04 NOTE — Tx Team (Signed)
Interdisciplinary Treatment and Diagnostic Plan Update  05/04/2016 Time of Session: 8:46 AM  Carlos [name] MRN: [id number]  Principal Diagnosis: Schizophrenia [facility])  Secondary Diagnoses: Principal Problem:   Schizophrenia ([place]) Active Problems:   Cannabis use disorder, severe, dependence ([place])   Tobacco use disorder   Posttraumatic stress disorder   Current Medications:  Current Facility-Administered Medications  Medication Dose Route Frequency Provider Last Rate Last Dose  . acetaminophen (TYLENOL) tablet 650 mg  650 mg Oral Q6H PRN Patrecia Pour, NP      . alum & mag hydroxide-simeth (MAALOX/MYLANTA) 200-200-20 MG/5ML suspension 30 mL  30 mL Oral Q4H PRN Patrecia Pour, NP   30 mL at 05/03/16 1316  . benztropine (COGENTIN) tablet 0.5 mg  0.5 mg Oral BID Patrecia Pour, NP   0.5 mg at 05/04/16 0756  . carbamazepine (TEGRETOL) tablet 200 mg  200 mg Oral BID PC Patrecia Pour, NP   200 mg at 05/04/16 0900  . hydrOXYzine (ATARAX/VISTARIL) tablet 25 mg  25 mg Oral Q6H PRN Patrecia Pour, NP   25 mg at 05/02/16 2046  . LORazepam (ATIVAN) tablet 1 mg  1 mg Oral Q6H PRN Ursula Alert, MD       Or  . LORazepam (ATIVAN) injection 1 mg  1 mg Intramuscular Q6H PRN Saramma Eappen, MD      . magnesium hydroxide (MILK OF MAGNESIA) suspension 30 mL  30 mL Oral Daily PRN Patrecia Pour, NP      . OLANZapine (ZYPREXA) tablet 10 mg  10 mg Oral TID PRN Ursula Alert, MD       Or  . OLANZapine (ZYPREXA) injection 10 mg  10 mg Intramuscular TID PRN Ursula Alert, MD      . prazosin (MINIPRESS) capsule 2 mg  2 mg Oral QHS Patrecia Pour, NP   2 mg at 05/03/16 2133  . traZODone (DESYREL) tablet 100 mg  100 mg Oral QHS Patrecia Pour, NP   100 mg at 05/03/16 2133  . ziprasidone (GEODON) capsule 80 mg  80 mg Oral BID WC Ursula Alert, MD   80 mg at 05/04/16 0756    PTA Medications: Prescriptions Prior to Admission  Medication Sig Dispense Refill Last Dose  . benztropine (COGENTIN) 0.5 MG  tablet Take 1 tablet (0.5 mg total) by mouth 2 (two) times daily. (Patient not taking: Reported on 05/02/2016) 30 tablet 0 Unknown at Unknown time  . hydrOXYzine (ATARAX/VISTARIL) 25 MG tablet Take 1 tablet (25 mg total) by mouth every 6 (six) hours as needed for anxiety. (Patient not taking: Reported on 05/02/2016) 30 tablet 0 Unknown at Unknown time  . prazosin (MINIPRESS) 2 MG capsule Take 1 capsule (2 mg total) by mouth at bedtime. (Patient not taking: Reported on 05/02/2016) 30 capsule 0 Unknown at Unknown time  . sertraline (ZOLOFT) 50 MG tablet Take 1 tablet (50 mg total) by mouth daily. (Patient not taking: Reported on 05/02/2016) 30 tablet 0 Unknown at Unknown time  . traZODone (DESYREL) 50 MG tablet Take 1 tablet (50 mg total) by mouth at bedtime and may repeat dose one time if needed. (Patient not taking: Reported on 05/02/2016) 30 tablet 0 Unknown at Unknown time    Treatment Modalities: Medication Management, Group therapy, Case management,  1 to 1 session with clinician, Psychoeducation, Recreational therapy.   Physician Treatment Plan for Primary Diagnosis: Schizophrenia [facility]) Long Term Goal(s): Improvement in symptoms so as ready for discharge  Short Term Goals: Ability  to identify and develop effective coping behaviors will improve  Medication Management: Evaluate patient's response, side effects, and tolerance of medication regimen.  Therapeutic Interventions: 1 to 1 sessions, Unit Group sessions and Medication administration.  Evaluation of Outcomes: Progressing  Physician Treatment Plan for Secondary Diagnosis: Principal Problem:   Schizophrenia ([place]) Active Problems:   Cannabis use disorder, severe, dependence ([place])   Tobacco use disorder   Posttraumatic stress disorder   Long Term Goal(s): Improvement in symptoms so as ready for discharge  Short Term Goals: Compliance with prescribed medications will improve  Medication Management: Evaluate patient's response, side  effects, and tolerance of medication regimen.  Therapeutic Interventions: 1 to 1 sessions, Unit Group sessions and Medication administration.  Evaluation of Outcomes: Progressing   RN Treatment Plan for Primary Diagnosis: Schizophrenia ([place]) Long Term Goal(s): Knowledge of disease and therapeutic regimen to maintain health will improve  Short Term Goals: Ability to verbalize feelings will improve and Compliance with prescribed medications will improve  Medication Management: RN will administer medications as ordered by provider, will assess and evaluate patient's response and provide education to patient for prescribed medication. RN will report any adverse and/or side effects to prescribing provider.  Therapeutic Interventions: 1 on 1 counseling sessions, Psychoeducation, Medication administration, Evaluate responses to treatment, Monitor vital signs and CBGs as ordered, Perform/monitor CIWA, COWS, AIMS and Fall Risk screenings as ordered, Perform wound care treatments as ordered.  Evaluation of Outcomes: Progressing   LCSW Treatment Plan for Primary Diagnosis: Schizophrenia ([place]) Long Term Goal(s): Safe transition to appropriate next level of care at discharge, Engage patient in therapeutic group addressing interpersonal concerns.  Short Term Goals: Engage patient in aftercare planning with referrals and resources  Therapeutic Interventions: Assess for all discharge needs, 1 to 1 time with Social worker, Explore available resources and support systems, Assess for adequacy in community support network, Educate family and significant other(s) on suicide prevention, Complete Psychosocial Assessment, Interpersonal group therapy.  Evaluation of Outcomes: Met  See below   Progress in Treatment: Attending groups: Yes Participating in groups: Yes Taking medication as prescribed: Yes Toleration medication: Yes, no side effects reported at this time Family/Significant other contact made:  No Patient understands diagnosis: No  Limited insight Discussing patient identified problems/goals with staff: Yes Medical problems stabilized or resolved: Yes Denies suicidal/homicidal ideation: Yes Issues/concerns per patient self-inventory: None Other: N/A  New problem(s) identified: None identified at this time.   New Short Term/Long Term Goal(s): None identified at this time.   Discharge Plan or Barriers: Return home, follow up outpt  Reason for Continuation of Hospitalization: Paranoia Disorganization Medication stabilization   Estimated Length of Stay: 3-5 days  Attendees: Patient: 05/04/2016  8:46 AM  Physician: Ursula Alert, MD 05/04/2016  8:46 AM  Nursing: Hoy Register, RN 05/04/2016  8:46 AM  RN Care Manager: Lars Pinks, RN 05/04/2016  8:46 AM  Social Worker: Ripley Fraise 05/04/2016  8:46 AM  Recreational Therapist: Winfield Cunas 05/04/2016  8:46 AM  Other: Norberto Sorenson 05/04/2016  8:46 AM  Other:  05/04/2016  8:46 AM    Scribe for Treatment Team:  Roque Lias 05/04/2016 8:46 AM

## 2016-05-04 NOTE — [facility] Group Notes (Signed)
[facility] LCSW Group Therapy  05/04/2016 , 3:28 PM   Type of Therapy:  Group Therapy  Participation Level:  Active  Participation Quality:  Attentive  Affect:  Appropriate  Cognitive:  Alert  Insight:  Improving  Engagement in Therapy:  Engaged  Modes of Intervention:  Discussion, Exploration and Socialization  Summary of Progress/Problems: Today's group focused on the term Diagnosis.  Participants were asked to define the term, and then pronounce whether it is a negative, positive or neutral term.  Had more difficulty today with attending to the discussion.  He was in and out several times, and while in the room was rarely sitting and often pacing.  "I just can't sit down today."  Said little about himself, but became activated when another patient spoke about being betrayed by a brother and his subsequent anger and rage.  Maxine encouraged him to let it go, and assured the patient that he would be paid back in kind down the road.  "That's just how it works."   [employer] Corporationorth, Thereasa DistanceRodney B 05/04/2016 , 3:28 PM

## 2016-05-04 NOTE — Progress Notes (Signed)
Recreation Therapy Notes  Date: 05/04/16 Time: 1000 Location: 500 Hall Dayroom  Group Topic: Coping Skills  Goal Area(s) Addresses:  Pt will be able to identify positive coping skills. Pt will be able to identify the benefits of positive coping skills. Pt will be able to identify how things will be different if positive coping skills are used post d/c.  Intervention: Financial traderWeb worksheet, colored pencils  Activity: OrthoptistWeb Design.  Patients were given a worksheet with a Orthoptistweb design on it.  Patients were to identify the things and situations they are dealing with that have them "stuck".  Patients were to then come up with coping skills they could use to help them get "unstuck" from the circumstances they are facing.  Education: PharmacologistCoping Skills, Building control surveyorDischarge Planning.   Education Outcome: Acknowledges understanding/In group clarification offered/Needs additional education.   Clinical Observations/Feedback: Pt did not attend group.    Caroll RancherMarjette Dartha Rozzell, LRT/CTRS         Lillia AbedLindsay, Rogerick Baldwin A 05/04/2016 11:59 AM

## 2016-05-04 NOTE — Progress Notes (Signed)
DAR NOTE: Patient presents with anxious mood and affect.  Denies pain, auditory and visual hallucinations.  Described energy level as normal and concentration as good.  Rates depression at 0, hopelessness at 0, and anxiety at 0.  Maintained on routine safety checks.  Medications given as prescribed.  Support and encouragement offered as needed.  Attended group and participated.  States goal for today is "attend group all day."  Patient observed socializing with peers in the dayroom.  Offered no complaint.

## 2016-05-04 NOTE — Progress Notes (Signed)
[facility] MD Progress Note  05/04/2016 12:36 PM Carlos [name]  MRN:  [id number] Subjective:  Patient states " I am fine , I am worried about my housing , I have to get out and pay my bill before they place a padlock on my door."  Objective:Carlos [name] a 25 y.o.AA male , who is single, reports he is homeless , has a hx of schizophrenia , who presented to [facility] , with psychosis .  Patient seen and chart reviewed.Discussed patient with treatment team.  Patient today seen as delusional and tangential as well as restless , although progressing. Pt continues to have pressured speech , continues to ruminate about her stressors. Per staff - pt is compliant on medications , denies ADRs. Will continue to encourage to attend groups and participate in milieu.       Principal Problem: Schizophrenia ([place]) Diagnosis:   Patient Active Problem List   Diagnosis Date Noted  . 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 Posttraumatic stress disorder [F43.10] 08/05/2015  . Schizophrenia ([place]) [F20.9]   . Cannabis use disorder, severe, dependence ([place]) [F12.20]   . Tobacco use disorder [F17.200]    Total Time spent with patient: 25 minutes  Past Psychiatric History: Please see H&P.   Past Medical History:  Past Medical History:  Diagnosis Date  . Depression   . Psychotic disorder   . PTSD (post-traumatic stress disorder)    History reviewed. No pertinent surgical history. Family History:  Family History  Problem Relation Age of Onset  . Mental illness Brother    Family Psychiatric  History: Please see H&P.  Social History: Please see H&P.  History  Alcohol Use No     History  Drug Use  . Types: Cocaine, Marijuana, [name], IV    Social History   Social History  . Marital status: Single    Spouse name: N/A  . Number of children: N/A  . Years of education: N/A   Social History Main Topics   . Smoking status: Current Every Day Smoker    Packs/day: 3.00    Types: Cigarettes  . Smokeless tobacco: Never Used  . Alcohol use No  . Drug use:     Types: Cocaine, Marijuana, [name], IV  . Sexual activity: Yes    Birth control/ protection: Condom   Other Topics Concern  . None   Social History Narrative  . None   Additional Social History:    Pain Medications: none Prescriptions: none Over the Counter: none History of alcohol / drug use?: Yes Negative Consequences of Use: Personal relationships, Legal Withdrawal Symptoms: Other (Comment) (none)                    Sleep: Fair  Appetite:  Fair  Current Medications: Current Facility-Administered Medications  Medication Dose Route Frequency Provider Last Rate Last Dose  . acetaminophen (TYLENOL) tablet 650 mg  650 mg Oral Q6H PRN Patrecia Pour, NP      . alum & mag hydroxide-simeth (MAALOX/MYLANTA) 200-200-20 MG/5ML suspension 30 mL  30 mL Oral Q4H PRN Patrecia Pour, NP   30 mL at 05/03/16 1316  . benztropine (COGENTIN) tablet 0.5 mg  0.5 mg Oral BID Patrecia Pour, NP   0.5 mg at 05/04/16 0756  . carbamazepine (TEGRETOL) tablet 200 mg  200 mg Oral BID PC Patrecia Pour, NP   200 mg at 05/04/16 0900  . hydrOXYzine (ATARAX/VISTARIL) tablet 25  mg  25 mg Oral Q6H PRN Patrecia Pour, NP   25 mg at 05/02/16 2046  . LORazepam (ATIVAN) tablet 1 mg  1 mg Oral Q6H PRN Ursula Alert, MD       Or  . LORazepam (ATIVAN) injection 1 mg  1 mg Intramuscular Q6H PRN Trayson Stitely, MD      . magnesium hydroxide (MILK OF MAGNESIA) suspension 30 mL  30 mL Oral Daily PRN Patrecia Pour, NP      . OLANZapine (ZYPREXA) tablet 10 mg  10 mg Oral TID PRN Ursula Alert, MD       Or  . OLANZapine (ZYPREXA) injection 10 mg  10 mg Intramuscular TID PRN Ursula Alert, MD      . prazosin (MINIPRESS) capsule 2 mg  2 mg Oral QHS Patrecia Pour, NP   2 mg at 05/03/16 2133  . traZODone (DESYREL) tablet 100 mg  100 mg Oral QHS  Patrecia Pour, NP   100 mg at 05/03/16 2133  . ziprasidone (GEODON) capsule 80 mg  80 mg Oral BID WC Ursula Alert, MD   80 mg at 05/04/16 0756    Lab Results:  Results for orders placed or performed during the hospital encounter of 05/02/16 (from the past 48 hour(s))  Lipid panel     Status: None   Collection Time: 05/04/16  6:42 AM  Result Value Ref Range   Cholesterol 135 0 - 200 mg/dL   Triglycerides 57 <150 mg/dL   HDL 56 >40 mg/dL   Total CHOL/HDL Ratio 2.4 RATIO   VLDL 11 0 - 40 mg/dL   LDL Cholesterol 68 0 - 99 mg/dL    Comment:        Total Cholesterol/HDL:CHD Risk Coronary Heart Disease Risk Table                     Men   Women  1/2 Average Risk   3.4   3.3  Average Risk       5.0   4.4  2 X Average Risk   9.6   7.1  3 X Average Risk  23.4   11.0        Use the calculated Patient Ratio above and the CHD Risk Table to determine the patient's CHD Risk.        ATP III CLASSIFICATION (LDL):  <100     mg/dL   Optimal  100-129  mg/dL   Near or Above                    Optimal  130-159  mg/dL   Borderline  160-189  mg/dL   High  >190     mg/dL   Very High Performed at [facility]   Comprehensive metabolic panel     Status: Abnormal   Collection Time: 05/04/16  6:42 AM  Result Value Ref Range   Sodium 139 135 - 145 mmol/L   Potassium 4.1 3.5 - 5.1 mmol/L   Chloride 108 101 - 111 mmol/L   CO2 26 22 - 32 mmol/L   Glucose, Bld 119 (H) 65 - 99 mg/dL   BUN 11 6 - 20 mg/dL   Creatinine, Ser 1.05 0.61 - 1.24 mg/dL   Calcium 8.9 8.9 - 10.3 mg/dL   Total Protein 6.2 (L) 6.5 - 8.1 g/dL   Albumin 3.5 3.5 - 5.0 g/dL   AST 20 15 - 41 U/L   ALT 25 17 -  63 U/L   Alkaline Phosphatase 41 38 - 126 U/L   Total Bilirubin 0.4 0.3 - 1.2 mg/dL   GFR calc non Af Amer >60 >60 mL/min   GFR calc Af Amer >60 >60 mL/min    Comment: (NOTE) The eGFR has been calculated using the CKD EPI equation. This calculation has not been validated in all clinical situations. eGFR's  persistently <60 mL/min signify possible Chronic Kidney Disease.    Anion gap 5 5 - 15    Comment: Performed at [facility]    Blood Alcohol level:  Lab Results  Component Value Date   [facility] <5 04/24/2016   ETH <5 83/15/1761    Metabolic Disorder Labs: Lab Results  Component Value Date   HGBA1C 5.4 08/29/2015   MPG 108 08/29/2015   MPG 111 06/04/2014   Lab Results  Component Value Date   PROLACTIN 113.1 (H) 08/29/2015   Lab Results  Component Value Date   CHOL 135 05/04/2016   TRIG 57 05/04/2016   HDL 56 05/04/2016   CHOLHDL 2.4 05/04/2016   VLDL 11 05/04/2016   LDLCALC 68 05/04/2016   LDLCALC 106 (H) 08/29/2015    Physical Findings: AIMS:  , ,  ,  ,    CIWA:    COWS:     Musculoskeletal: Strength & Muscle Tone: within normal limits Gait & Station: normal Patient leans: N/A  Psychiatric Specialty Exam: Physical Exam  Nursing note and vitals reviewed.   Review of Systems  Psychiatric/Behavioral: Positive for depression. The patient is nervous/anxious.   All other systems reviewed and are negative.   Blood pressure 119/81, pulse 81, temperature 98 F (36.7 C), temperature source Oral, resp. rate 16, height _0  (1.803 m), weight 79.4 kg (175 lb).Body mass index is 24.41 kg/m.  General Appearance: Guarded  Eye Contact:  Fair  Speech:  Pressured  Volume:  Normal  Mood:  Anxious and Dysphoric  Affect:  Depressed  Thought Process:  Disorganized and Descriptions of Associations: Circumstantial  Orientation:  Full (Time, Place, and Person)  Thought Content:  Delusions, Paranoid Ideation and Rumination  Suicidal Thoughts:  No is paranoid and delusional - making him a danger to self or others  Homicidal Thoughts:  No  Memory:  Immediate;   Fair Recent;   Fair Remote;   Fair  Judgement:  Impaired  Insight:  Shallow  Psychomotor Activity:  Restlessness  Concentration:  Concentration: Fair and Attention Span: Fair  Recall:  [employer]  of Knowledge:  Fair  Language:  Fair  Akathisia:  No  Handed:  Right  AIMS (if indicated):     Assets:  Desire for Improvement  ADL's:  Intact  Cognition:  WNL  Sleep:  Number of Hours: 5.75     Treatment Plan Summary:Mikail [name] a 25 y.o.AA male , who is single, reports he is homeless , has a hx of schizophrenia , who presented to [facility] , with psychosis .  Patient continues to be disorganized , making irrelevant delusional statements , is pressured and anxious , although improving since admission. Continues to need IP admission and treatment.  Daily contact with patient to assess and evaluate symptoms and progress in treatment and Medication management Will continue Geodon  80 mg po bid for psychosis. Will continue Tegretol 200 mg po bid for mood sx. Tegretol level on 05/05/16. Will continue Cogentin 0.5 mg po bid for EPS. Will continue Prazosin 2 mg po qhs for nightmares. Will continue Trazodone 100  mg po qhs for sleep. Will make available PRN medications as per agitation protocol. Will continue to monitor vitals ,medication compliance and treatment side effects while patient is here.  Will monitor for medical issues as well as call consult as needed.  Reviewed labs cmp - shows K+ as low on admission ( 04/24/16) - repeat - wnl ( 05/04/16)  tsh- wnl ( 08/29/15) , lipid panel- wnl , hba1c- wnl ( 08/29/15) . CSW will continue working on disposition. CSW to obtain collateral information. Patient to participate in therapeutic milieu .  Marvie Brevik, MD 05/04/2016, 12:36 PM

## 2016-05-04 NOTE — Progress Notes (Signed)
Adult Psychoeducational Group Note  Date:  05/04/2016 Time:  9:49 PM  Group Topic/Focus:  Wrap-Up Group:   The focus of this group is to help patients review their daily goal of treatment and discuss progress on daily workbooks.   Participation Level:  Active  Participation Quality:  Appropriate  Affect:  Appropriate  Cognitive:  Appropriate  Insight: Appropriate  Engagement in Group:  Engaged  Modes of Intervention:  Discussion  Additional Comments: The patient expressed that he had a wonderful day.The patient also said that he attend all groups. Octavio Mannshigpen, Vella Colquitt Lee 05/04/2016, 9:49 PM

## 2016-05-04 NOTE — Progress Notes (Signed)
Pt reports he has had a good day.  He denies SI/HI/AVH at this time.  He presents to staff in a good mood.  He is jovial and laughing with staff, making appropriate jokes.  He states he is hopeful to discharge soon.  He requested his night medications at 2000, but Clinical research associatewriter encouraged him to wait until later after evening group and snack time to take his medications.  Pt agreed, but said he might be asleep by then.  Around 2115, Clinical research associatewriter took pt's meds to his room where he was already in the bed.  Writer was able to get pt to respond, but he did not want to take his medications.  The meds will be kept in the med drawer until 0200 to see if he will take them later.  Support and encouragement offered.  Pt makes his needs known to staff.  Discharge plans are in process.  Safety maintained with q15 minute checks.

## 2016-05-04 NOTE — Progress Notes (Signed)
Pt has been out of his room and visible on the milieu this evening.  Pt reports that his day has been ok.  He wants to sign a 72H request, but Clinical research associatewriter explained that he is here under involuntary papers and cannot sign the document.  Pt was encouraged to speak to the doctor tomorrow about his discharge.  Later, pt was talking to staff about randomly killing people and his negative view of the police.  Staff talked with him about his homicidal thoughts and how that event could affect a lot of innocent people.  Pt was encouraged to have positive thoughts toward more productive things he could do for society.  Pt's thoughts continue to be somewhat tangential.  He denies SI/AVH.  His homicidal thoughts appear to be more of a passive nature and based on the event that occurred in [place] on Sunday Oct 1.  Pt has otherwise been cooperative and pleasant with staff and peers.  He makes his needs known to staff.  Support and encouragement offered.  Discharge plans are in process.  Safety maintained with q15 minute checks.

## 2016-05-04 NOTE — Progress Notes (Signed)
Recreation Therapy Notes  Animal-Assisted Activity (AAA) Program Checklist/Progress Notes Patient Eligibility Criteria Checklist & Daily Group note for Rec Tx Intervention  Date: 10.03.2017 Time: 2:45pm [address]    AAA/T Program Assumption of Risk Form signed by Patient/ or Parent Legal Guardian Yes  Patient is free of allergies or sever asthma Yes  Patient reports no fear of animals Yes  Patient reports no history of cruelty to animals Yes  Patient understands his/her participation is voluntary Yes  Patient washes hands before animal contact Yes  Patient washes hands after animal contact Yes  Behavioral Response: Appropriate    Education: Hand Washing, Appropriate Animal Interaction   Education Outcome: Acknowledges education.   Clinical Observations/Feedback: Patient discussed with MD for appropriateness in pet therapy session. Both LRT and MD agree patient is appropriate for participation. Patient offered participation in session and signed necessary consent form without issue. Patient attended session, interacted with peers appropriately and pet therapy dog. Patient asked appropriate questions about therapy dog and his training. Patient remained in session for its entirety.   Carlos [name], LRT/CTRS  Claretha Townshend L 05/04/2016 3:15 PM
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
## 2016-05-05 LAB — CARBAMAZEPINE LEVEL, TOTAL: Carbamazepine Lvl: 8.7 ug/mL (ref 4.0–12.0)

## 2016-05-05 MED ORDER — ZIPRASIDONE HCL 80 MG PO CAPS
80.0000 mg | ORAL_CAPSULE | Freq: Every day | ORAL | Status: DC
  Administered 2016-05-06: 80 mg via ORAL
  Filled 2016-05-05 (×3): qty 1

## 2016-05-05 MED ORDER — ZIPRASIDONE HCL 80 MG PO CAPS
100.0000 mg | ORAL_CAPSULE | Freq: Every day | ORAL | Status: DC
  Administered 2016-05-05: 100 mg via ORAL
  Filled 2016-05-05 (×3): qty 1

## 2016-05-05 NOTE — Progress Notes (Signed)
Carlos [name]  05/05/2016 1:56 PM Carlos [name]  MRN:  [id number] Subjective:  Patient states " I am worried about my mother . They killed her . "   Objective:Carlos Broomeis a 25 y.o.AA male , who is single, reports he is homeless , has a hx of schizophrenia , who presented to [facility] , with psychosis .  Patient seen and chart reviewed.Discussed patient with treatment team.  Patient today continues to be seen as delusional and continues to ruminate about his mother. Pt continues to have pressured speech ,although is more redirectable. Per staff - pt is compliant on medications , denies ADRs. Will continue to encourage to attend groups and participate in milieu.       Principal Problem: Schizophrenia ([place]) Diagnosis:   Patient Active Problem List   Diagnosis Date Noted  . 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 Posttraumatic stress disorder [F43.10] 08/05/2015  . Schizophrenia ([place]) [F20.9]   . Cannabis use disorder, severe, dependence ([place]) [F12.20]   . Tobacco use disorder [F17.200]    Total Time spent with patient: 25 minutes  Past Psychiatric History: Please see H&P.   Past Medical History:  Past Medical History:  Diagnosis Date  . Depression   . Psychotic disorder   . PTSD (post-traumatic stress disorder)    History reviewed. No pertinent surgical history. Family History:  Family History  Problem Relation Age of Onset  . Mental illness Brother    Family Psychiatric  History: Please see H&P.  Social History: Please see H&P.  History  Alcohol Use No     History  Drug Use  . Types: Cocaine, Marijuana, [name], IV    Social History   Social History  . Marital status: Single    Spouse name: N/A  . Number of children: N/A  . Years of education: N/A   Social History Main Topics  . Smoking status: Current Every Day Smoker    Packs/day: 3.00    Types:  Cigarettes  . Smokeless tobacco: Never Used  . Alcohol use No  . Drug use:     Types: Cocaine, Marijuana, [name], IV  . Sexual activity: Yes    Birth control/ protection: Condom   Other Topics Concern  . None   Social History Narrative  . None   Additional Social History:    Pain Medications: none Prescriptions: none Over the Counter: none History of alcohol / drug use?: Yes Negative Consequences of Use: Personal relationships, Legal Withdrawal Symptoms: Other (Comment) (none)                    Sleep: Fair  Appetite:  Fair  Current Medications: Current Facility-Administered Medications  Medication Dose Route Frequency Provider Last Rate Last Dose  . acetaminophen (TYLENOL) tablet 650 mg  650 mg Oral Q6H PRN Patrecia Pour, NP      . alum & mag hydroxide-simeth (MAALOX/MYLANTA) 200-200-20 MG/5ML suspension 30 mL  30 mL Oral Q4H PRN Patrecia Pour, NP   30 mL at 05/03/16 1316  . benztropine (COGENTIN) tablet 0.5 mg  0.5 mg Oral BID Patrecia Pour, NP   0.5 mg at 05/05/16 0827  . carbamazepine (TEGRETOL) tablet 200 mg  200 mg Oral BID PC Patrecia Pour, NP   200 mg at 05/05/16 0827  . hydrOXYzine (ATARAX/VISTARIL) tablet 25 mg  25 mg Oral Q6H PRN Patrecia Pour, NP   25 mg at 05/02/16  2046  . LORazepam (ATIVAN) tablet 1 mg  1 mg Oral Q6H PRN Ursula Alert, MD       Or  . LORazepam (ATIVAN) injection 1 mg  1 mg Intramuscular Q6H PRN Bufford Helms, MD      . magnesium hydroxide (MILK OF MAGNESIA) suspension 30 mL  30 mL Oral Daily PRN Patrecia Pour, NP      . OLANZapine (ZYPREXA) tablet 10 mg  10 mg Oral TID PRN Ursula Alert, MD       Or  . OLANZapine (ZYPREXA) injection 10 mg  10 mg Intramuscular TID PRN Ursula Alert, MD      . prazosin (MINIPRESS) capsule 2 mg  2 mg Oral QHS Patrecia Pour, NP   2 mg at 05/03/16 2133  . traZODone (DESYREL) tablet 100 mg  100 mg Oral QHS Patrecia Pour, NP   100 mg at 05/03/16 2133  . ziprasidone (GEODON) capsule 100  mg  100 mg Oral Q supper Ursula Alert, MD      . Derrill Memo ON 05/06/2016] ziprasidone (GEODON) capsule 80 mg  80 mg Oral Q breakfast Ursula Alert, MD        Lab Results:  Results for orders placed or performed during the hospital encounter of 05/02/16 (from the past 48 hour(s))  Lipid panel     Status: None   Collection Time: 05/04/16  6:42 AM  Result Value Ref Range   Cholesterol 135 0 - 200 mg/dL   Triglycerides 57 <150 mg/dL   HDL 56 >40 mg/dL   Total CHOL/HDL Ratio 2.4 RATIO   VLDL 11 0 - 40 mg/dL   LDL Cholesterol 68 0 - 99 mg/dL    Comment:        Total Cholesterol/HDL:CHD Risk Coronary Heart Disease Risk Table                     Men   Women  1/2 Average Risk   3.4   3.3  Average Risk       5.0   4.4  2 X Average Risk   9.6   7.1  3 X Average Risk  23.4   11.0        Use the calculated Patient Ratio above and the CHD Risk Table to determine the patient's CHD Risk.        ATP III CLASSIFICATION (LDL):  <100     mg/dL   Optimal  100-129  mg/dL   Near or Above                    Optimal  130-159  mg/dL   Borderline  160-189  mg/dL   High  >190     mg/dL   Very High Performed at [facility]   Comprehensive metabolic panel     Status: Abnormal   Collection Time: 05/04/16  6:42 AM  Result Value Ref Range   Sodium 139 135 - 145 mmol/L   Potassium 4.1 3.5 - 5.1 mmol/L   Chloride 108 101 - 111 mmol/L   CO2 26 22 - 32 mmol/L   Glucose, Bld 119 (H) 65 - 99 mg/dL   BUN 11 6 - 20 mg/dL   Creatinine, Ser 1.05 0.61 - 1.24 mg/dL   Calcium 8.9 8.9 - 10.3 mg/dL   Total Protein 6.2 (L) 6.5 - 8.1 g/dL   Albumin 3.5 3.5 - 5.0 g/dL   AST 20 15 - 41 U/L  ALT 25 17 - 63 U/L   Alkaline Phosphatase 41 38 - 126 U/L   Total Bilirubin 0.4 0.3 - 1.2 mg/dL   GFR calc non Af Amer >60 >60 mL/min   GFR calc Af Amer >60 >60 mL/min    Comment: ([name]) The eGFR has been calculated using the CKD EPI equation. This calculation has not been validated in all clinical  situations. eGFR's persistently <60 mL/min signify possible Chronic Kidney Disease.    Anion gap 5 5 - 15    Comment: Performed at [facility]  Carbamazepine level, total     Status: None   Collection Time: 05/05/16  6:43 AM  Result Value Ref Range   Carbamazepine Lvl 8.7 4.0 - 12.0 ug/mL    Comment: Performed at [facility]    Blood Alcohol level:  Lab Results  Component Value Date   [facility] <5 04/24/2016   ETH <5 16/05/9603    Metabolic Disorder Labs: Lab Results  Component Value Date   HGBA1C 5.4 08/29/2015   MPG 108 08/29/2015   MPG 111 06/04/2014   Lab Results  Component Value Date   PROLACTIN 113.1 (H) 08/29/2015   Lab Results  Component Value Date   CHOL 135 05/04/2016   TRIG 57 05/04/2016   HDL 56 05/04/2016   CHOLHDL 2.4 05/04/2016   VLDL 11 05/04/2016   LDLCALC 68 05/04/2016   LDLCALC 106 (H) 08/29/2015    Physical Findings: AIMS:  , ,  ,  ,    CIWA:    COWS:     Musculoskeletal: Strength & Muscle Tone: within normal limits Gait & Station: normal Patient leans: N/A  Psychiatric Specialty Exam: Physical Exam  Nursing [name] and vitals reviewed.   Review of Systems  Psychiatric/Behavioral: Positive for depression. The patient is nervous/anxious.   All other systems reviewed and are negative.   Blood pressure (!) 139/106, pulse 61, temperature 98.7 F (37.1 C), temperature source Oral, resp. rate 18, height '5\' 11"'  (1.803 m), weight 79.4 kg (175 lb).Body mass index is 24.41 kg/m.  General Appearance: Guarded  Eye Contact:  Fair  Speech:  Pressured improving  Volume:  Normal  Mood:  Anxious and Dysphoric  Affect:  Depressed  Thought Process:  Goal Directed and Descriptions of Associations: Circumstantial irrelevant at times - but is more linear  Orientation:  Full (Time, Place, and Person)  Thought Content:  Delusions, Paranoid Ideation and Rumination  Suicidal Thoughts:  No is paranoid and delusional - making him a  danger to self or others  Homicidal Thoughts:  No  Memory:  Immediate;   Fair Recent;   Fair Remote;   Fair  Judgement:  Impaired  Insight:  Shallow  Psychomotor Activity:  Restlessness  Concentration:  Concentration: Fair and Attention Span: Fair  Recall:  [employer] of Knowledge:  Fair  Language:  Fair  Akathisia:  No  Handed:  Right  AIMS (if indicated):     Assets:  Desire for Improvement  ADL's:  Intact  Cognition:  WNL  Sleep:  Number of Hours: 6.75     Treatment Plan Summary:Carlos Broomeis a 25 y.o.AA male , who is single, reports he is homeless , has a hx of schizophrenia , who presented to [facility] , with psychosis .  Patient continues to be pressured , delusional ,however is improving .   Daily contact with patient to assess and evaluate symptoms and progress in treatment and Medication management Will increase Geodon to 80  mg po daily with breakfast and 100 mg po qpm  for psychosis. Will continue Tegretol 200 mg po bid for mood sx. Tegretol level on 05/05/16- 8.7 - therapeutic. Will continue Cogentin 0.5 mg po bid for EPS. Will continue Prazosin 2 mg po qhs for nightmares. Will continue Trazodone 100 mg po qhs for sleep. Will make available PRN medications as per agitation protocol. Will continue to monitor vitals ,medication compliance and treatment side effects while patient is here.  Will monitor for medical issues as well as call consult as needed.  Reviewed labs cmp - shows K+ as low on admission ( 04/24/16) - repeat - wnl ( 05/04/16)  tsh- wnl ( 08/29/15) , lipid panel- wnl , hba1c- wnl ( 08/29/15) . CSW will continue working on disposition. CSW to obtain collateral information. Patient to participate in therapeutic milieu .  Carlos Welliver, MD 05/05/2016, 1:56 PM

## 2016-05-05 NOTE — Progress Notes (Signed)
Recreation Therapy Notes  Date: 05/05/16 Time: 1000 Location: 500 Hall Dayroom  Group Topic: Communication, Team Building, Problem Solving  Goal Area(s) Addresses:  Patient will effectively work with peer towards shared goal.  Patient will identify skill used to make activity successful.  Patient will identify how skills used during activity can be used to reach post d/c goals.   Behavioral Response: Engaged  Intervention: STEM Activity   Activity: Glass blower/designeripe Cleaner Tower. In teams, patients were asked to build the tallest freestanding tower possible out of 15 pipe cleaners. Systematically resources were removed, for example patient ability to use both hands and patient ability to verbally communicate.    Education: Pharmacist, communityocial Skills, Building control surveyorDischarge Planning.   Education Outcome: Acknowledges education/In group clarification offered/Needs additional education.   Clinical Observations/Feedback: Pt stated in communication you have to be heard and that there can only be one chief.  Pt also expressed that he can only offer his help and it's up to the individual whether they accept it or not.  Pt went on to say the activity was easy because his team communicated the whole time.  Pt stated they made legs to balance the tower and that he was able to look at his partner and figure out what they needed to do.  Pt gave [name] lot of praise to his teammate.   Carlos RancherMarjette Aadan [name], Carlos [name]      Carlos RancherLindsay, Carlos [name] 05/05/2016 12:10 PM

## 2016-05-05 NOTE — Progress Notes (Signed)
Pt went to bed early last night without taking any medications and slept all night.

## 2016-05-05 NOTE — Progress Notes (Signed)
Pt is on unit and resting in bed.  Pt sts he is ready to sleep for the night.  Pt asks about what meds he is to take.  Pt sts he does not wish to take the Minipress as it makes him feel funny.  Pt asks for meds to be brought to him as he is under his covers ready for sleep. Pt receives meds and is laying in bed. Continue to monitor pt for safety.

## 2016-05-05 NOTE — Progress Notes (Signed)
D-pt c/o of some mild depression, pt sts "i gotta find out about my mommas death" A-pt took his medications and attended group R-cont to monitor for safety

## 2016-05-05 NOTE — [facility] Group Notes (Signed)
[facility] LCSW Group Therapy  05/05/2016 2:33 PM   Type of Therapy:  Group Therapy   Participation Level:  Engaged  Participation Quality:  Attentive  Affect:  Appropriate   Cognitive:  Alert   Insight:  Engaged  Engagement in Therapy:  Improving   Modes of Intervention:  Education, Exploration, Socialization   Summary of Progress/Problems: Carlos [name] was engaged throughout, stayed entire time.   Carlos [name] from [facility] [employer] was here to tell his story of recovery, inform patients about [facility] and play his guitar.   Carlos [name] 05/05/2016 2:33 PM

## 2016-05-06 MED ORDER — PRAZOSIN HCL 2 MG PO CAPS: 2.0000 mg | ORAL_CAPSULE | Freq: Every day | ORAL | 0 refills | Status: DC

## 2016-05-06 MED ORDER — BENZTROPINE MESYLATE 0.5 MG PO TABS: 0.5000 mg | ORAL_TABLET | Freq: Two times a day (BID) | ORAL | 0 refills | Status: DC

## 2016-05-06 MED ORDER — ZIPRASIDONE HCL 20 MG PO CAPS
20.0000 mg | ORAL_CAPSULE | Freq: Every day | ORAL | Status: DC
  Filled 2016-05-06: qty 7

## 2016-05-06 MED ORDER — ZIPRASIDONE HCL 80 MG PO CAPS
80.0000 mg | ORAL_CAPSULE | Freq: Every day | ORAL | 0 refills | Status: DC
Start: 2016-05-07 — End: 2019-04-13

## 2016-05-06 MED ORDER — ZIPRASIDONE HCL 20 MG PO CAPS: 100.0000 mg | ORAL_CAPSULE | Freq: Every day | ORAL | 0 refills | Status: DC

## 2016-05-06 MED ORDER — TRAZODONE HCL 100 MG PO TABS: 100.0000 mg | ORAL_TABLET | Freq: Every day | ORAL | 0 refills | Status: DC

## 2016-05-06 MED ORDER — CARBAMAZEPINE 200 MG PO TABS: 200.0000 mg | ORAL_TABLET | Freq: Two times a day (BID) | ORAL | 0 refills | Status: DC

## 2016-05-06 MED ORDER — HYDROXYZINE HCL 25 MG PO TABS: 25.0000 mg | ORAL_TABLET | Freq: Four times a day (QID) | ORAL | 0 refills | Status: DC | PRN
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
## 2016-05-06 NOTE — Progress Notes (Signed)
Recreation Therapy Notes  Date: 05/06/16 Time: 1000 Location: 500 Hall Dayroom  Group Topic: Self-Esteem  Goal Area(s) Addresses:  Patient will identify positive ways to increase self-esteem. Patient will verbalize benefit of increased self-esteem.  Behavioral Response: Engaged  Intervention: Theme park managerColored pencils, worksheets with blank face  Activity: How I See Me.  LRT talked with patients about what self-esteem is and why it is important.  LRT then passed out worksheets with blank faces on them.  Patients were to draw or write how they see themselves.  Patients were to focus on the positive aspects of themselves that make them who they are.  Education:  Self-Esteem, Building control surveyorDischarge Planning.   Education Outcome: Acknowledges education/In group clarification offered/Needs additional education  Clinical Observations/Feedback: Pt stated that self-esteem how you look at yourself and that sometimes it can be influenced by others.  Pt described himself as [name] because I will just pop out on you and make you laugh".  Pt also stated he likes to bring sunshine, is [name], likes having fun and puts out good energy.    Carlos [name], LRT/CTRS       Carlos [name], Carlos [name] 05/06/2016 11:45 AM

## 2016-05-06 NOTE — Progress Notes (Signed)
Patient discharged to lobby. Patient was stable and appreciative at that time. All papers, samples and prescriptions were given and valuables returned. Verbal understanding expressed. Denies SI/HI and A/VH. Patient given opportunity to express concerns and ask questions.  

## 2016-05-06 NOTE — Discharge Summary (Signed)
Physician Discharge Summary Note  Patient:  Carlos [name] is an 25 y.o., male MRN:  [id number] DOB:  07/18/1991 Patient phone:  [phone] (home)  Patient address:   [address] [place] [address],  Total Time spent with patient: 30 minutes  Date of Admission:  05/02/2016 Date of Discharge: 05/06/2016  Reason for Admission: Worsening symptoms of Schizophrenia.  Principal Problem: Schizophrenia [facility](HCC)  Discharge Diagnoses: Patient Active Problem List   Diagnosis Date Noted  . 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 Posttraumatic stress disorder [F43.10] 08/05/2015  . Schizophrenia (HCC) [F20.9]   . Cannabis use disorder, severe, dependence (HCC) [F12.20]   . Tobacco use disorder [F17.200]    Past Psychiatric History: Hx. Schizophrenia  Past Medical History:  Past Medical History:  Diagnosis Date  . Depression   . Psychotic disorder   . PTSD (post-traumatic stress disorder)    History reviewed. No pertinent surgical history.  Family History:  Family History  Problem Relation Age of Onset  . Mental illness Brother    Family Psychiatric  History: See H&P  Social History:  History  Alcohol Use No     History  Drug Use  . Types: Cocaine, Marijuana, [name], IV    Social History   Social History  . Marital status: Single    Spouse name: N/A  . Number of children: N/A  . Years of education: N/A   Social History Main Topics  . Smoking status: Current Every Day Smoker    Packs/day: 3.00    Types: Cigarettes  . Smokeless tobacco: Never Used  . Alcohol use No  . Drug use:     Types: Cocaine, Marijuana, [name], IV  . Sexual activity: Yes    Birth control/ protection: Condom   Other Topics Concern  . None   Social History Narrative  . None   Hospital Course: Izora RibasDerrick Broomeis a 25 y.o.AA male , who is single, reports he is homeless ,  has a hx of schizophrenia, [facility] by [employer]. It has been documented that pt was at [employer] [facility] [employer] and asked to leave the store due to saying random things and harassing customers. Pt reported that the police were following him and the cops was going through his bag accusing him of smoking a bong. Pt reported that he was only going to smoke a black. Pt denies SI at this time but when pt was asked about previous attempts pt stated "I just need my NBA celebrity daddy".   Carlos [name] [facility] for worsening symptoms of Schizophrenia. He was apparently in a [employer] store harassing other customers. He was in need of mood stabilization treatment.  Carlos [name] was treated & discharged with the medications listed below under Medication List. He presented no other significant health issues that required treatment & or monitoring.   During the course of his hospitalization, [name]'s improvement was monitored by observation and his daily report of symptom reduction noted.  His emotional and mental status were monitored by daily self-inventory reports completed by him and the clinical staff. As his treatment was ongoing, he was evaluated by the treatment team for mood stability and plans for continued recovery after discharge. His motivation was an integral factor in his mood stability. He was offered further treatment options upon discharge on outpatient as noted below.  Upon discharge, Carlos [name] was both mentally and medically stable denying suicidal/homicidal ideation,  auditory/visual/tactile hallucinations, delusional thoughts and paranoia. [name]'s symptoms responded well to his treatment regimen without adverse effects. He was provided with a 7 days worth, supply samples of his [facility] discharge medications. He left [facility] with all personal belongings in no apparent distress. Transportation per city bus. [facility] assisted with bus pass..   Physical Findings: AIMS:  , ,  ,  ,     CIWA:    COWS:     Musculoskeletal: Strength & Muscle Tone: within normal limits Gait & Station: normal Patient leans: N/A  Psychiatric Specialty Exam: SEE SRA BY MD Review of Systems  Constitutional: Negative.   HENT: Negative.   Eyes: Negative.   Respiratory: Negative.   Cardiovascular: Negative.   Gastrointestinal: Negative.   Genitourinary: Negative.   Musculoskeletal: Negative.   Skin: Negative.   Neurological: Negative.   Endo/Heme/Allergies: Negative.   Psychiatric/Behavioral: Positive for depression (Stable) and hallucinations (Hx, Cannabis use disorder). Negative for memory loss and suicidal ideas. The patient has insomnia (Stable). The patient is not nervous/anxious (stable).   All other systems reviewed and are negative.   Blood pressure 124/89, pulse 64, temperature 97.5 F (36.4 C), temperature source Oral, resp. rate 20, height 5\' 11"  (1.803 m), weight 79.4 kg (175 lb).Body mass index is 24.41 kg/m.  Have you used any form of tobacco in the last 30 days? (Cigarettes, Smokeless Tobacco, Cigars, and/or Pipes): Patient Refused Screening  Has this patient used any form of tobacco in the last 30 days? (Cigarettes, Smokeless Tobacco, Cigars, and/or Pipes) , No  Metabolic Disorder Labs:  Lab Results  Component Value Date   HGBA1C 5.4 08/29/2015   MPG 108 08/29/2015   MPG 111 06/04/2014   Lab Results  Component Value Date   PROLACTIN 113.1 (H) 08/29/2015   Lab Results  Component Value Date   CHOL 135 05/04/2016   TRIG 57 05/04/2016   HDL 56 05/04/2016   CHOLHDL 2.4 05/04/2016   VLDL 11 05/04/2016   LDLCALC 68 05/04/2016   LDLCALC 106 (H) 08/29/2015    See Psychiatric Specialty Exam and Suicide Risk Assessment completed by Attending Physician prior to discharge.  Discharge destination:  Home  Is patient on multiple antipsychotic therapies at discharge:  No   Has Patient had three or more failed trials of antipsychotic monotherapy by history:   No  Recommended Plan for Multiple Antipsychotic Therapies: NA     Medication List    STOP taking these medications   sertraline 50 MG tablet Commonly known as:  ZOLOFT     TAKE these medications     Indication  benztropine 0.5 MG tablet Commonly known as:  COGENTIN Take 1 tablet (0.5 mg total) by mouth 2 (two) times daily. For drug induced tremors What changed:  additional instructions  Indication:  Extrapyramidal Reaction caused by Medications   carbamazepine 200 MG tablet Commonly known as:  TEGRETOL Take 1 tablet (200 mg total) by mouth 2 (two) times daily after a meal. For mood stabilization  Indication:  Mood stabilization   hydrOXYzine 25 MG tablet Commonly known as:  ATARAX/VISTARIL Take 1 tablet (25 mg total) by mouth every 6 (six) hours as needed for anxiety.  Indication:  Anxiety Neurosis   prazosin 2 MG capsule Commonly known as:  MINIPRESS Take 1 capsule (2 mg total) by mouth at bedtime. For nightmares What changed:  additional instructions  Indication:  Nightmares   traZODone 100 MG tablet Commonly known as:  DESYREL Take 1 tablet (100 mg total) by mouth at  bedtime. For insomnia What changed:  medication strength  how much to take  when to take this  additional instructions  Indication:  Trouble Sleeping   ziprasidone 20 MG capsule Commonly known as:  GEODON Take 5 capsules (100 mg total) by mouth daily with supper. For mood control  Indication:  Mood control   ziprasidone 80 MG capsule Commonly known as:  GEODON Take 1 capsule (80 mg total) by mouth daily with breakfast. For mood control Start taking on:  05/07/2016  Indication:  Mood control      Follow-up recommendations:  Activity:  as tolerated Diet:  heart healthy Other:  follow-up with [facility]  Comments:  Take all medications as prescribed. Keep all follow-up appointments as scheduled.  Do not consume alcohol or use illegal drugs while on prescription medications. Report any  adverse effects from your medications to your primary care provider promptly.  In the event of recurrent symptoms or worsening symptoms, call 911, a crisis hotline, or go to the nearest emergency department for evaluation.   Signed: Armandina Stammer I FNP-BC  05/06/2016, 10:39 AM

## 2016-05-06 NOTE — Plan of Care (Signed)
Problem: [facility] Participation in Recreation Therapeutic Interventions Goal: STG-Patient will demonstrate improved communication skills b STG: Communication - Patient will improve communication skills, as demonstrated by ability to actively participate in at least 2 processing discussion during recreation therapy group sessions by conclusion of recreation therapy tx  Outcome: Completed/Met Date Met: 05/06/16 Pt was able to demonstrate improved communication skills at the completion of self-esteem, communication and wellness recreation therapy sessions.  [name], LRT/CTRS

## 2016-05-06 NOTE — [facility] Suicide Risk Assessment (Signed)
[facility]   Principal Problem: Schizophrenia [facility](HCC) Discharge Diagnoses:  Patient Active Problem List   Diagnosis Date Noted  . 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 Posttraumatic stress disorder [F43.10] 08/05/2015  . Schizophrenia (HCC) [F20.9]   . Cannabis use disorder, severe, dependence (HCC) [F12.20]   . Tobacco use disorder [F17.200]     Total Time spent with patient: 30 minutes  Musculoskeletal: Strength & Muscle Tone: within normal limits Gait & Station: normal Patient leans: N/A  Psychiatric Specialty Exam: Review of Systems  Psychiatric/Behavioral: Negative for depression and hallucinations. The patient is not nervous/anxious and does not have insomnia.   All other systems reviewed and are negative.   Blood pressure 124/89, pulse 64, temperature 97.5 F (36.4 C), temperature source Oral, resp. rate 20, height 5\' 11"  (1.803 m), weight 79.4 kg (175 lb).Body mass index is 24.41 kg/m.  General Appearance: Casual  Eye Contact::  Fair  Speech:  Clear and Coherent409  Volume:  Normal  Mood:  Euthymic  Affect:  Appropriate  Thought Process:  Goal Directed and Descriptions of Associations: Intact  Orientation:  Full (Time, Place, and Person)  Thought Content:  Logical  Suicidal Thoughts:  No  Homicidal Thoughts:  No  Memory:  Immediate;   Fair Recent;   Fair Remote;   Fair  Judgement:  Fair  Insight:  Fair  Psychomotor Activity:  Normal  Concentration:  Fair  Recall:  [employer]Fair  Fund of Knowledge:Fair  Language: Fair  Akathisia:  No  Handed:  Right  AIMS (if indicated):   0  Assets:  Desire for Improvement  Sleep:  Number of Hours: 5.75  Cognition: WNL  ADL's:  Intact   Mental Status Per Nursing Assessment::   On Admission:  NA  Demographic Factors:  Male  Loss Factors: NA  Historical Factors: Impulsivity  Risk Reduction Factors:   Positive social  support  Continued Clinical Symptoms:  Previous Psychiatric Diagnoses and Treatments  Cognitive Features That Contribute To Risk:  None    Suicide Risk:  Minimal: No identifiable suicidal ideation.  Patients presenting with no risk factors but with morbid ruminations; may be classified as minimal risk based on the severity of the depressive symptoms    Plan Of Care/Follow-up recommendations:  Activity:  no restrictions Diet:  regular Tests:  as needed Other:  none  Binyomin Brann, MD 05/06/2016, 10:00 AM

## 2016-05-06 NOTE — Tx Team (Signed)
Interdisciplinary Treatment and Diagnostic Plan Update  05/06/2016 Time of Session: 8:19 AM  Carlos [name] MRN: [id number]  Principal Diagnosis: Schizophrenia [facility])  Secondary Diagnoses: Principal Problem:   Schizophrenia ([place]) Active Problems:   Cannabis use disorder, severe, dependence ([place])   Tobacco use disorder   Posttraumatic stress disorder   Current Medications:  Current Facility-Administered Medications  Medication Dose Route Frequency Provider Last Rate Last Dose  . acetaminophen (TYLENOL) tablet 650 mg  650 mg Oral Q6H PRN Patrecia Pour, NP      . alum & mag hydroxide-simeth (MAALOX/MYLANTA) 200-200-20 MG/5ML suspension 30 mL  30 mL Oral Q4H PRN Patrecia Pour, NP   30 mL at 05/03/16 1316  . benztropine (COGENTIN) tablet 0.5 mg  0.5 mg Oral BID Patrecia Pour, NP   0.5 mg at 05/05/16 1726  . carbamazepine (TEGRETOL) tablet 200 mg  200 mg Oral BID PC Patrecia Pour, NP   200 mg at 05/05/16 1726  . hydrOXYzine (ATARAX/VISTARIL) tablet 25 mg  25 mg Oral Q6H PRN Patrecia Pour, NP   25 mg at 05/02/16 2046  . LORazepam (ATIVAN) tablet 1 mg  1 mg Oral Q6H PRN Ursula Alert, MD       Or  . LORazepam (ATIVAN) injection 1 mg  1 mg Intramuscular Q6H PRN Saramma Eappen, MD      . magnesium hydroxide (MILK OF MAGNESIA) suspension 30 mL  30 mL Oral Daily PRN Patrecia Pour, NP      . OLANZapine (ZYPREXA) tablet 10 mg  10 mg Oral TID PRN Ursula Alert, MD       Or  . OLANZapine (ZYPREXA) injection 10 mg  10 mg Intramuscular TID PRN Ursula Alert, MD      . prazosin (MINIPRESS) capsule 2 mg  2 mg Oral QHS Patrecia Pour, NP   2 mg at 05/03/16 2133  . traZODone (DESYREL) tablet 100 mg  100 mg Oral QHS Patrecia Pour, NP   100 mg at 05/05/16 2151  . ziprasidone (GEODON) capsule 100 mg  100 mg Oral Q supper Ursula Alert, MD   100 mg at 05/05/16 1726  . ziprasidone (GEODON) capsule 80 mg  80 mg Oral Q breakfast Ursula Alert, MD        PTA Medications: Prescriptions Prior  to Admission  Medication Sig Dispense Refill Last Dose  . benztropine (COGENTIN) 0.5 MG tablet Take 1 tablet (0.5 mg total) by mouth 2 (two) times daily. (Patient not taking: Reported on 05/02/2016) 30 tablet 0 Unknown at Unknown time  . hydrOXYzine (ATARAX/VISTARIL) 25 MG tablet Take 1 tablet (25 mg total) by mouth every 6 (six) hours as needed for anxiety. (Patient not taking: Reported on 05/02/2016) 30 tablet 0 Unknown at Unknown time  . prazosin (MINIPRESS) 2 MG capsule Take 1 capsule (2 mg total) by mouth at bedtime. (Patient not taking: Reported on 05/02/2016) 30 capsule 0 Unknown at Unknown time  . sertraline (ZOLOFT) 50 MG tablet Take 1 tablet (50 mg total) by mouth daily. (Patient not taking: Reported on 05/02/2016) 30 tablet 0 Unknown at Unknown time  . traZODone (DESYREL) 50 MG tablet Take 1 tablet (50 mg total) by mouth at bedtime and may repeat dose one time if needed. (Patient not taking: Reported on 05/02/2016) 30 tablet 0 Unknown at Unknown time    Treatment Modalities: Medication Management, Group therapy, Case management,  1 to 1 session with clinician, Psychoeducation, Recreational therapy.   Physician Treatment Plan for  Primary Diagnosis: Schizophrenia ([place]) Long Term Goal(s): Improvement in symptoms so as ready for discharge  Short Term Goals: Ability to identify and develop effective coping behaviors will improve  Medication Management: Evaluate patient's response, side effects, and tolerance of medication regimen.  Therapeutic Interventions: 1 to 1 sessions, Unit Group sessions and Medication administration.  Evaluation of Outcomes: Adequate for Discharge  Physician Treatment Plan for Secondary Diagnosis: Principal Problem:   Schizophrenia ([place]) Active Problems:   Cannabis use disorder, severe, dependence ([place])   Tobacco use disorder   Posttraumatic stress disorder   Long Term Goal(s): Improvement in symptoms so as ready for discharge  Short Term Goals: Compliance  with prescribed medications will improve  Medication Management: Evaluate patient's response, side effects, and tolerance of medication regimen.  Therapeutic Interventions: 1 to 1 sessions, Unit Group sessions and Medication administration.  Evaluation of Outcomes: Adequate for Discharge   RN Treatment Plan for Primary Diagnosis: Schizophrenia ([place]) Long Term Goal(s): Knowledge of disease and therapeutic regimen to maintain health will improve  Short Term Goals: Ability to verbalize feelings will improve and Compliance with prescribed medications will improve  Medication Management: RN will administer medications as ordered by provider, will assess and evaluate patient's response and provide education to patient for prescribed medication. RN will report any adverse and/or side effects to prescribing provider.  Therapeutic Interventions: 1 on 1 counseling sessions, Psychoeducation, Medication administration, Evaluate responses to treatment, Monitor vital signs and CBGs as ordered, Perform/monitor CIWA, COWS, AIMS and Fall Risk screenings as ordered, Perform wound care treatments as ordered.  Evaluation of Outcomes: Adequate for Discharge   LCSW Treatment Plan for Primary Diagnosis: Schizophrenia [facility]) Long Term Goal(s): Safe transition to appropriate next level of care at discharge, Engage patient in therapeutic group addressing interpersonal concerns.  Short Term Goals: Engage patient in aftercare planning with referrals and resources  Therapeutic Interventions: Assess for all discharge needs, 1 to 1 time with Social worker, Explore available resources and support systems, Assess for adequacy in community support network, Educate family and significant other(s) on suicide prevention, Complete Psychosocial Assessment, Interpersonal group therapy.  Evaluation of Outcomes: Met  See below   Progress in Treatment: Attending groups: Yes Participating in groups: Yes Taking medication as  prescribed: Yes Toleration medication: Yes, no side effects reported at this time Family/Significant other contact made: No Patient understands diagnosis: No  Limited insight Discussing patient identified problems/goals with staff: Yes Medical problems stabilized or resolved: Yes Denies suicidal/homicidal ideation: Yes Issues/concerns per patient self-inventory: None Other: N/A  New problem(s) identified: None identified at this time.   New Short Term/Long Term Goal(s): None identified at this time.   Discharge Plan or Barriers: Return home, follow up outpt  Reason for Continuation of Hospitalization: Paranoia Disorganization Medication stabilization   Estimated Length of Stay: D/C today  Attendees: Patient: 05/06/2016  8:19 AM  Physician: Ursula Alert, MD 05/06/2016  8:19 AM  Nursing: Hoy Register, RN 05/06/2016  8:19 AM  RN Care Manager: Lars Pinks, RN 05/06/2016  8:19 AM  Social Worker: Ripley Fraise 05/06/2016  8:19 AM  Recreational Therapist: Winfield Cunas 05/06/2016  8:19 AM  Other: Norberto Sorenson 05/06/2016  8:19 AM  Other:  05/06/2016  8:19 AM    Scribe for Treatment Team:  Roque Lias LCSW 05/06/2016 8:19 AM

## 2016-05-06 NOTE — Progress Notes (Signed)
  [facility] Discharge Plan :  Will you be returning to the same living situation after discharge:  Yes,  home At discharge, do you have transportation home?: Yes,  bus pass Do you have the ability to pay for your medications: Yes,  mental health  Release of information consent forms completed and in the chart;  Patient's signature needed at discharge.  Patient to Follow up at: Follow-up Information    [facility] .   Specialty:  Behavioral Health Why:  Go to the walk-in clinic M-F between 8 and 11 within the next 7 days for your hospital follow up appointment Contact information: [address] [place] [address] [phone]           Next level of care provider has [facility] to [facility] Link:no  Safety Planning and Suicide Prevention discussed: Yes,  yes  Have you used any form of tobacco in the last 30 days? (Cigarettes, Smokeless Tobacco, Cigars, and/or Pipes): Patient Refused Screening  Has patient been referred to the Quitline?: Patient refused referral  Patient has been referred for addiction treatment: Pt. refused referral  Carlos [name] 05/06/2016, 11:09 AM

## 2019-01-19 ENCOUNTER — Other Ambulatory Visit: Payer: Self-pay

## 2019-01-19 ENCOUNTER — Emergency Department (HOSPITAL_COMMUNITY)
Admission: EM | Admit: 2019-01-19 | Discharge: 2019-01-22 | Disposition: A | Discharge status: Home / Self Care | Payer: Medicare Other | Attending: Emergency Medicine | Admitting: Emergency Medicine

## 2019-01-19 DIAGNOSIS — W228XXA Striking against or struck by other objects, initial encounter: Secondary | ICD-10-CM | POA: Diagnosis not present

## 2019-01-19 DIAGNOSIS — S0591XA Unspecified injury of right eye and orbit, initial encounter: Secondary | ICD-10-CM | POA: Diagnosis present

## 2019-01-19 DIAGNOSIS — Y929 Unspecified place or not applicable: Secondary | ICD-10-CM | POA: Insufficient documentation

## 2019-01-19 DIAGNOSIS — F17210 Nicotine dependence, cigarettes, uncomplicated: Secondary | ICD-10-CM | POA: Insufficient documentation

## 2019-01-19 DIAGNOSIS — S0011XA Contusion of right eyelid and periocular area, initial encounter: Secondary | ICD-10-CM | POA: Diagnosis not present

## 2019-01-19 DIAGNOSIS — Z9119 Patient's noncompliance with other medical treatment and regimen: Secondary | ICD-10-CM | POA: Diagnosis not present

## 2019-01-19 DIAGNOSIS — Z20828 Contact with and (suspected) exposure to other viral communicable diseases: Secondary | ICD-10-CM | POA: Insufficient documentation

## 2019-01-19 DIAGNOSIS — F209 Schizophrenia, unspecified: Secondary | ICD-10-CM

## 2019-01-19 DIAGNOSIS — F201 Disorganized schizophrenia: Secondary | ICD-10-CM | POA: Diagnosis present

## 2019-01-19 DIAGNOSIS — Z79899 Other long term (current) drug therapy: Secondary | ICD-10-CM | POA: Diagnosis not present

## 2019-01-19 DIAGNOSIS — Y999 Unspecified external cause status: Secondary | ICD-10-CM | POA: Insufficient documentation

## 2019-01-19 DIAGNOSIS — Y9389 Activity, other specified: Secondary | ICD-10-CM | POA: Diagnosis not present

## 2019-01-19 DIAGNOSIS — S0511XA Contusion of eyeball and orbital tissues, right eye, initial encounter: Secondary | ICD-10-CM

## 2019-01-19 DIAGNOSIS — F1220 Cannabis dependence, uncomplicated: Secondary | ICD-10-CM | POA: Diagnosis not present

## 2019-01-19 LAB — COMPREHENSIVE METABOLIC PANEL
ALT: 74 U/L — ABNORMAL HIGH (ref 0–44)
AST: 148 U/L — ABNORMAL HIGH (ref 15–41)
Albumin: 3.9 g/dL (ref 3.5–5.0)
Alkaline Phosphatase: 59 U/L (ref 38–126)
Anion gap: 9 (ref 5–15)
BUN: 8 mg/dL (ref 6–20)
CO2: 24 mmol/L (ref 22–32)
Calcium: 8.9 mg/dL (ref 8.9–10.3)
Chloride: 107 mmol/L (ref 98–111)
Creatinine, Ser: 1.19 mg/dL (ref 0.61–1.24)
GFR calc Af Amer: >60 mL/min (ref >60)
GFR calc non Af Amer: >60 mL/min (ref >60)
Glucose, Bld: 128 mg/dL — ABNORMAL HIGH (ref 70–99)
Potassium: 3.7 mmol/L (ref 3.5–5.1)
Sodium: 140 mmol/L (ref 135–145)
Total Bilirubin: 0.7 mg/dL (ref 0.3–1.2)
Total Protein: 6.9 g/dL (ref 6.5–8.1)

## 2019-01-19 LAB — CBC WITH DIFFERENTIAL/PLATELET
Abs Immature Granulocytes: 0.04 10*3/uL (ref 0.00–0.07)
Basophils Absolute: 0.1 10*3/uL (ref 0.0–0.1)
Basophils Relative: 1 %
Eosinophils Absolute: 0.1 10*3/uL (ref 0.0–0.5)
Eosinophils Relative: 1 %
HCT: 46.1 % (ref 39.0–52.0)
Hemoglobin: 15.6 g/dL (ref 13.0–17.0)
Immature Granulocytes: 0 %
Lymphocytes Relative: 14 %
Lymphs Abs: 1.5 10*3/uL (ref 0.7–4.0)
MCH: 30.5 pg (ref 26.0–34.0)
MCHC: 33.8 g/dL (ref 30.0–36.0)
MCV: 90 fL (ref 80.0–100.0)
Monocytes Absolute: 0.6 10*3/uL (ref 0.1–1.0)
Monocytes Relative: 6 %
Neutro Abs: 8.6 10*3/uL — ABNORMAL HIGH (ref 1.7–7.7)
Neutrophils Relative %: 78 %
Platelets: 271 10*3/uL (ref 150–400)
RBC: 5.12 MIL/uL (ref 4.22–5.81)
RDW: 13.8 % (ref 11.5–15.5)
WBC: 11 10*3/uL — ABNORMAL HIGH (ref 4.0–10.5)
nRBC: 0 % (ref 0.0–0.2)

## 2019-01-19 LAB — RAPID URINE DRUG SCREEN, HOSP PERFORMED
Amphetamines: NOT DETECTED
Barbiturates: NOT DETECTED
Benzodiazepines: NOT DETECTED
Cocaine: NOT DETECTED
Opiates: NOT DETECTED
Tetrahydrocannabinol: POSITIVE — AB

## 2019-01-19 LAB — SARS CORONAVIRUS 2 BY RT PCR (HOSPITAL ORDER, PERFORMED IN ~~LOC~~ HOSPITAL LAB): SARS Coronavirus 2: NEGATIVE

## 2019-01-19 LAB — ETHANOL: Alcohol, Ethyl (B): <10 mg/dL (ref <10)

## 2019-01-19 MED ORDER — MELATONIN 1 MG PO TABS
2.0000 mg | ORAL_TABLET | Freq: Every evening | ORAL | Status: DC | PRN
  Administered 2019-01-19: 22:00:00 2 mg via ORAL
  Filled 2019-01-19 (×2): qty 2

## 2019-01-19 MED ORDER — HYDROXYZINE HCL 25 MG PO TABS
25.0000 mg | ORAL_TABLET | Freq: Four times a day (QID) | ORAL | Status: DC | PRN
  Administered 2019-01-19: 25 mg via ORAL
  Filled 2019-01-19: qty 1

## 2019-01-19 MED ORDER — IBUPROFEN 200 MG PO TABS
600.0000 mg | ORAL_TABLET | Freq: Once | ORAL | Status: AC
  Administered 2019-01-19: 600 mg via ORAL
  Filled 2019-01-19: qty 3

## 2019-01-19 MED ORDER — ACETAMINOPHEN 325 MG PO TABS
650.0000 mg | ORAL_TABLET | Freq: Once | ORAL | Status: AC
  Administered 2019-01-19: 650 mg via ORAL
  Filled 2019-01-19: qty 2

## 2019-01-19 NOTE — ED Triage Notes (Signed)
Pt arrived to ED by EMS. Pt to room 30. Pt oriented to unit. According to EMS per pt brother, the pt is schizophrenia and has been off his medication.  Pt calm, flat, blunted. Pt walks with a limp.
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
## 2019-01-19 NOTE — ED Notes (Signed)
TTS notified writer patient was appropriate for admission to [facility] but no appropriate bed available at this time for him. Will be notified when a bed is available for him. Plan for him to spend the night here on TCU. Brother Darrick Meigs called and phone made available to [place] and Probation officer dialed brothers number for him to talk to. He is spending a lot of time at the sink in his room running water without noted purpose.
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
## 2019-01-19 NOTE — ED Notes (Signed)
Helped him to reposition self due to right knee hurting. States hurts all the time. Supported it with pillows and folded blankets. Had his Melatonin and Vistaril. Too soon for more pain meds, had Ibuprofen at 2000 . Expressed his appreciation, states he is feeling tired.

## 2019-01-19 NOTE — ED Notes (Signed)
Restless, frequently at bathroom sink. Gave him Vistaril prn as ordered for restlessness and to help him sleep along with his Melatonin which he reports he takes nightly. Continues to offer little and given minimal responses to questions asked. Will continue to monitor for safety. Sitter at doorway.

## 2019-01-19 NOTE — ED Notes (Signed)
Bed: KK93 Expected date:  Expected time:  Means of arrival:  Comments: EMS-schizophrenia

## 2019-01-19 NOTE — ED Notes (Signed)
Prefers to be called Carlos [name]. Flat affect, good eye contact, offers little. Only complaint when asked is knee pain, requested heat and ice for it, and prefers Ibuprofen to Tylenol. Will notify MD for order. TTS speaking with him now re disposition. Will continue to monitor for safety.

## 2019-01-19 NOTE — ED Notes (Signed)
Pt calm. Pt stated he was hearing voices and seeing things not there. Stated he saw his mother who in deceased.

## 2019-01-20 DIAGNOSIS — S0011XA Contusion of right eyelid and periocular area, initial encounter: Secondary | ICD-10-CM | POA: Diagnosis not present

## 2019-01-20 LAB — HEPATITIS PANEL, ACUTE
HCV Ab: <0.1 s/co ratio (ref 0.0–0.9)
Hep A IgM: NEGATIVE
Hep B C IgM: NEGATIVE
Hepatitis B Surface Ag: NEGATIVE

## 2019-01-20 MED ORDER — ZIPRASIDONE HCL 20 MG PO CAPS: 100.0000 mg | ORAL_CAPSULE | Freq: Every day | ORAL | Status: DC

## 2019-01-20 MED ORDER — CARBAMAZEPINE 200 MG PO TABS
200.0000 mg | ORAL_TABLET | Freq: Two times a day (BID) | ORAL | Status: DC
  Administered 2019-01-20 – 2019-01-22 (×4): 200 mg via ORAL
  Filled 2019-01-20 (×5): qty 1

## 2019-01-20 MED ORDER — ZIPRASIDONE HCL 20 MG PO CAPS
80.0000 mg | ORAL_CAPSULE | Freq: Two times a day (BID) | ORAL | Status: DC
  Administered 2019-01-20 – 2019-01-22 (×4): 80 mg via ORAL
  Filled 2019-01-20 (×4): qty 4

## 2019-01-20 MED ORDER — TRAZODONE HCL 100 MG PO TABS
100.0000 mg | ORAL_TABLET | Freq: Every day | ORAL | Status: DC
  Administered 2019-01-20 – 2019-01-21 (×2): 100 mg via ORAL
  Filled 2019-01-20 (×2): qty 1

## 2019-01-20 MED ORDER — IBUPROFEN 200 MG PO TABS
600.0000 mg | ORAL_TABLET | Freq: Four times a day (QID) | ORAL | Status: DC | PRN
  Administered 2019-01-20 – 2019-01-22 (×4): 600 mg via ORAL
  Filled 2019-01-20 (×4): qty 3

## 2019-01-20 MED ORDER — IBUPROFEN 200 MG PO TABS
600.0000 mg | ORAL_TABLET | Freq: Once | ORAL | Status: AC
  Administered 2019-01-20: 600 mg via ORAL
  Filled 2019-01-20: qty 3

## 2019-01-20 MED ORDER — ZIPRASIDONE HCL 20 MG PO CAPS
80.0000 mg | ORAL_CAPSULE | Freq: Every day | ORAL | Status: DC
  Filled 2019-01-20: qty 4

## 2019-01-20 MED ORDER — BENZTROPINE MESYLATE 0.5 MG PO TABS
0.5000 mg | ORAL_TABLET | Freq: Two times a day (BID) | ORAL | Status: DC
  Administered 2019-01-20 – 2019-01-22 (×5): 0.5 mg via ORAL
  Filled 2019-01-20 (×5): qty 1

## 2019-01-20 NOTE — Progress Notes (Signed)
This patient continues to meet inpatient criteria. CSW faxed information to the following facilities:   [facility]  [place], [place] Social Worker [phone]

## 2019-01-20 NOTE — ED Notes (Signed)
Patient pacing around unit remains irritable.  MD contacted to let him know patient is not IVC'd.

## 2019-01-20 NOTE — Consult Note (Addendum)
Telepsych Consultation   Reason for Consult:  Auditory hallucinations Referring Physician:  EDP Location of Patient:  Location of Provider: [facility]  Patient Identification: Carlos [name] MRN:  [id number] Principal Diagnosis: Disorganized schizophrenia [facility]) Diagnosis:  Principal Problem:   Disorganized schizophrenia ([place]) Active Problems:   Cannabis use disorder, severe, dependence ([place])   Total Time spent with patient: 30 minutes  Subjective:   Carlos [name] is a 28 y.o. male patient admitted with disorganized behavior and paranoia.  HPI:  Pt was seen and chart reviewed with treatment team and Dr Darleene Cleaver. Pt denies suicidal/homicidal ideation. Pt's SARS swab is negative. Pt has a history of Schizophrenia. He has not been compliant with his medications. His UDS is positive for THC, BAL negative. He stated he lives alone and receives SSDI for his Schizophrenia. He appears disorganized and responding to internal stimuli. He is sitting on the edge of the bed and has a towel wrapped around his head. He stated he feels people are watching him and following him. He stated he made the satellites. He stated he hears voices and he hears his mother screaming. He denied that he has visual hallucinations. He agrees that he has been hospitalized multiple times for his schizophrenia. Today he does not agree to a voluntary inpatient admission and will be placed under IVC by the EDP. His medications have been adjusted. He initially refused his PO medications this morning but did take them eventually. Pt is recommended for inpatient treatment, TTS will seek placement.   Past Psychiatric History: As above  Risk to Self: Suicidal Ideation: No(Pt denies. ) Suicidal Intent: No Is patient at risk for suicide?: No Suicidal Plan?: No(Pt denies. ) Access to Means: Yes Specify Access to Suicidal Means: Gun and knives.  What has been your use of drugs/alcohol within the last 12  months?: Marijuana, cigarettes.  How many times?: 0 Other Self Harm Risks: NA Triggers for Past Attempts: None known Intentional Self Injurious Behavior: None(Pt denies. ) Risk to Others: Homicidal Ideation: No(Pt denies. ) Thoughts of Harm to Others: No(Pt denies. ) Current Homicidal Intent: No Current Homicidal Plan: No Access to Homicidal Means: No Identified Victim: NA History of harm to others?: No Assessment of Violence: None Noted Violent Behavior Description: NA Does patient have access to weapons?: No Criminal Charges Pending?: No Does patient have a court date: No Prior Inpatient Therapy: Prior Inpatient Therapy: No(Pt denies. ) Prior Outpatient Therapy: Prior Outpatient Therapy: No(Pt denies. ) Does patient have an ACCT team?: No Does patient have Intensive In-House Services?  : No Does patient have [facility] services? : No Does patient have P4CC services?: No  Past Medical History:  Past Medical History:  Diagnosis Date  . Depression   . Psychotic disorder   . PTSD (post-traumatic stress disorder)    No past surgical history on file. Family History:  Family History  Problem Relation Age of Onset  . Mental illness Brother    Family Psychiatric  History: Pt did not provide this information Social History:  Social History   Substance and Sexual Activity  Alcohol Use No     Social History   Substance and Sexual Activity  Drug Use Yes  . Types: Cocaine, Marijuana, [name], IV    Social History   Socioeconomic History  . Marital status: Single    Spouse name: Not on file  . Number of children: Not on file  . Years of education: Not on file  . Highest education level:  Not on file  Occupational History  . Not on file  Social Needs  . Financial resource strain: Not on file  . Food insecurity    Worry: Not on file    Inability: Not on file  . Transportation needs    Medical: Not on file    Non-medical: Not on file  Tobacco Use  . Smoking  status: Current Every Day Smoker    Packs/day: 3.00    Types: Cigarettes  . Smokeless tobacco: Never Used  Substance and Sexual Activity  . Alcohol use: No  . Drug use: Yes    Types: Cocaine, Marijuana, [name], IV  . Sexual activity: Yes    Birth control/protection: Condom  Lifestyle  . Physical activity    Days per week: Not on file    Minutes per session: Not on file  . Stress: Not on file  Relationships  . Social Musicianconnections    Talks on phone: Not on file    Gets together: Not on file    Attends religious service: Not on file    Active member of club or organization: Not on file    Attends meetings of clubs or organizations: Not on file    Relationship status: Not on file  Other Topics Concern  . Not on file  Social History Narrative  . Not on file   Additional Social History:    Allergies:   Allergies  Allergen Reactions  . Depakote [Divalproex Sodium] Swelling    Tongue swells  . Invega [Paliperidone Er] Swelling  . Other Other (See Comments)    Patient stated no injections    Labs:  Results for orders placed or performed during the hospital encounter of 01/19/19 (from the past 48 hour(s))  Comprehensive metabolic panel     Status: Abnormal   Collection Time: 01/19/19  5:55 PM  Result Value Ref Range   Sodium 140 135 - 145 mmol/L   Potassium 3.7 3.5 - 5.1 mmol/L   Chloride 107 98 - 111 mmol/L   CO2 24 22 - 32 mmol/L   Glucose, Bld 128 (H) 70 - 99 mg/dL   BUN 8 6 - 20 mg/dL   Creatinine, Ser 0.981.19 0.61 - 1.24 mg/dL   Calcium 8.9 8.9 - 11.910.3 mg/dL   Total Protein 6.9 6.5 - 8.1 g/dL   Albumin 3.9 3.5 - 5.0 g/dL   AST 147148 (H) 15 - 41 U/L   ALT 74 (H) 0 - 44 U/L   Alkaline Phosphatase 59 38 - 126 U/L   Total Bilirubin 0.7 0.3 - 1.2 mg/dL   GFR calc non Af Amer >60 >60 mL/min   GFR calc Af Amer >60 >60 mL/min   Anion gap 9 5 - 15    Comment: Performed at [facility], [address] [place], [place] [address]  Ethanol      Status: None   Collection Time: 01/19/19  5:55 PM  Result Value Ref Range   Alcohol, Ethyl (B) <10 <10 mg/dL    Comment: (NOTE) Lowest detectable limit for serum alcohol is 10 mg/dL. For medical purposes only. Performed at [facility], [address] [place], [place] [address]   Urine rapid drug screen (hosp performed)     Status: Abnormal   Collection Time: 01/19/19  5:55 PM  Result Value Ref Range   Opiates NONE DETECTED NONE DETECTED   Cocaine NONE DETECTED NONE DETECTED   Benzodiazepines NONE DETECTED NONE DETECTED   Amphetamines NONE DETECTED  NONE DETECTED   Tetrahydrocannabinol POSITIVE (A) NONE DETECTED   Barbiturates NONE DETECTED NONE DETECTED    Comment: (NOTE) DRUG SCREEN FOR MEDICAL PURPOSES ONLY.  IF CONFIRMATION IS NEEDED FOR ANY PURPOSE, NOTIFY LAB WITHIN 5 DAYS. LOWEST DETECTABLE LIMITS FOR URINE DRUG SCREEN Drug Class                     Cutoff (ng/mL) Amphetamine and metabolites    1000 Barbiturate and metabolites    200 Benzodiazepine                 200 Tricyclics and metabolites     300 Opiates and metabolites        300 Cocaine and metabolites        300 THC                            50 Performed at [facility], [address] [place], [place] [address]   CBC with Diff     Status: Abnormal   Collection Time: 01/19/19  5:55 PM  Result Value Ref Range   WBC 11.0 (H) 4.0 - 10.5 K/uL   RBC 5.12 4.22 - 5.81 MIL/uL   Hemoglobin 15.6 13.0 - 17.0 g/dL   HCT 60.446.1 54.039.0 - 98.152.0 %   MCV 90.0 80.0 - 100.0 fL   MCH 30.5 26.0 - 34.0 pg   MCHC 33.8 30.0 - 36.0 g/dL   RDW 19.113.8 47.811.5 - 29.515.5 %   Platelets 271 150 - 400 K/uL   nRBC 0.0 0.0 - 0.2 %   Neutrophils Relative % 78 %   Neutro Abs 8.6 (H) 1.7 - 7.7 K/uL   Lymphocytes Relative 14 %   Lymphs Abs 1.5 0.7 - 4.0 K/uL   Monocytes Relative 6 %   Monocytes Absolute 0.6 0.1 - 1.0 K/uL   Eosinophils Relative 1 %   Eosinophils Absolute 0.1 0.0 - 0.5 K/uL   Basophils  Relative 1 %   Basophils Absolute 0.1 0.0 - 0.1 K/uL   Immature Granulocytes 0 %   Abs Immature Granulocytes 0.04 0.00 - 0.07 K/uL    Comment: Performed at [facility], [address] [place], [place] [address]  SARS Coronavirus 2 (CEPHEID - Performed in [facility] hospital lab), Hosp Order     Status: None   Collection Time: 01/19/19  5:55 PM   Specimen: Nasopharyngeal Swab  Result Value Ref Range   SARS Coronavirus 2 NEGATIVE NEGATIVE    Comment: (NOTE) If result is NEGATIVE SARS-CoV-2 target nucleic acids are NOT DETECTED. The SARS-CoV-2 RNA is generally detectable in upper and lower  respiratory specimens during the acute phase of infection. The lowest  concentration of SARS-CoV-2 viral copies this assay can detect is 250  copies / mL. A negative result does not preclude SARS-CoV-2 infection  and should not be used as the sole basis for treatment or other  patient management decisions.  A negative result may occur with  improper specimen collection / handling, submission of specimen other  than nasopharyngeal swab, presence of viral mutation(s) within the  areas targeted by this assay, and inadequate number of viral copies  (<250 copies / mL). A negative result must be combined with clinical  observations, patient history, and epidemiological information. If result is POSITIVE SARS-CoV-2 target nucleic acids are DETECTED. The SARS-CoV-2 RNA is generally detectable in upper and lower  respiratory specimens dur ing the  acute phase of infection.  Positive  results are indicative of active infection with SARS-CoV-2.  Clinical  correlation with patient history and other diagnostic information is  necessary to determine patient infection status.  Positive results do  not rule out bacterial infection or co-infection with other viruses. If result is PRESUMPTIVE POSTIVE SARS-CoV-2 nucleic acids MAY BE PRESENT.   A presumptive positive result was obtained on the  submitted specimen  and confirmed on repeat testing.  While 2019 novel coronavirus  (SARS-CoV-2) nucleic acids may be present in the submitted sample  additional confirmatory testing may be necessary for epidemiological  and / or clinical management purposes  to differentiate between  SARS-CoV-2 and other Sarbecovirus currently known to infect humans.  If clinically indicated additional testing with an alternate test  methodology [id number]) is advised. The SARS-CoV-2 RNA is generally  detectable in upper and lower respiratory sp ecimens during the acute  phase of infection. The expected result is Negative. Fact Sheet for Patients:  BoilerBrush.com.cyhttps://www.fda.gov/media/136312/download Fact Sheet for Healthcare Providers: https://pope.com/https://www.fda.gov/media/136313/download This test is not yet approved or cleared by the [place] FDA and has been authorized for detection and/or diagnosis of SARS-CoV-2 by FDA under an Emergency Use Authorization (EUA).  This EUA will remain in effect (meaning this test can be used) for the duration of the COVID-19 declaration under Section 564(b)(1) of the Act, 21 U.S.C. section 360bbb-3(b)(1), unless the authorization is terminated or revoked sooner. Performed at [facility], [address] [place], [place] [address]   Hepatitis panel, acute     Status: None   Collection Time: 01/19/19  5:55 PM  Result Value Ref Range   Hepatitis B Surface Ag Negative Negative   HCV Ab <0.1 0.0 - 0.9 s/co ratio    Comment: (NOTE)                                  Negative:     < 0.8                             Indeterminate: 0.8 - 0.9                                  Positive:     > 0.9 The CDC recommends that a positive HCV antibody result be followed up with a HCV Nucleic Acid Amplification test ([id number]). Performed At: [facility] [place] [facility] [address] [place], [place] [address] Jolene SchimkeNagendra Sanjai MD NG:[phone]:[phone]    Hep A IgM Negative Negative   Hep B C  IgM Negative Negative    Medications:  Current Facility-Administered Medications  Medication Dose Route Frequency Provider Last Rate Last Dose  . benztropine (COGENTIN) tablet 0.5 mg  0.5 mg Oral BID Raeford RazorKohut, Stephen, MD   0.5 mg at 01/20/19 1113  . carbamazepine (TEGRETOL) tablet 200 mg  200 mg Oral BID [facility] Kohut, Stephen, MD   200 mg at 01/20/19 1116  . hydrOXYzine (ATARAX/VISTARIL) tablet 25 mg  25 mg Oral Q6H PRN Terrilee FilesButler, Michael C, MD   25 mg at 01/19/19 2128  . Melatonin TABS 2 mg  2 mg Oral QHS PRN Terrilee FilesButler, Michael C, MD   2 mg at 01/19/19 2137  . traZODone (DESYREL) tablet 100 mg  100 mg Oral QHS Raeford RazorKohut, Stephen, MD      .  ziprasidone (GEODON) capsule 80 mg  80 mg Oral BID WC Zaila Crew, MD   80 mg at 01/20/19 1113   Current Outpatient Medications  Medication Sig Dispense Refill  . Melatonin 1 MG TABS Take 2 mg by mouth at bedtime as needed (sleep).     . benztropine (COGENTIN) 0.5 MG tablet Take 1 tablet (0.5 mg total) by mouth 2 (two) times daily. For drug induced tremors (Patient not taking: Reported on 01/19/2019) 60 tablet 0  . carbamazepine (TEGRETOL) 200 MG tablet Take 1 tablet (200 mg total) by mouth 2 (two) times daily after a meal. For mood stabilization (Patient not taking: Reported on 01/19/2019) 60 tablet 0  . hydrOXYzine (ATARAX/VISTARIL) 25 MG tablet Take 1 tablet (25 mg total) by mouth every 6 (six) hours as needed for anxiety. (Patient not taking: Reported on 01/19/2019) 60 tablet 0  . prazosin (MINIPRESS) 2 MG capsule Take 1 capsule (2 mg total) by mouth at bedtime. For nightmares (Patient not taking: Reported on 01/19/2019) 30 capsule 0  . traZODone (DESYREL) 100 MG tablet Take 1 tablet (100 mg total) by mouth at bedtime. For insomnia (Patient not taking: Reported on 01/19/2019) 30 tablet 0  . ziprasidone (GEODON) 20 MG capsule Take 5 capsules (100 mg total) by mouth daily with supper. For mood control (Patient not taking: Reported on 01/19/2019) 150 capsule 0  .  ziprasidone (GEODON) 80 MG capsule Take 1 capsule (80 mg total) by mouth daily with breakfast. For mood control (Patient not taking: Reported on 01/19/2019) 30 capsule 0    Musculoskeletal: Strength & Muscle Tone: within normal limits Gait & Station: normal Patient leans: N/A  Psychiatric Specialty Exam: Physical Exam  Constitutional: He appears well-developed and well-nourished.  HENT:  Head: Normocephalic.  Respiratory: Effort normal.  Musculoskeletal: Normal range of motion.  Neurological: He is alert.  Psychiatric: His speech is tangential. He is actively hallucinating. Thought content is paranoid. Cognition and memory are impaired.    Review of Systems  Psychiatric/Behavioral: Positive for hallucinations (auditory).  All other systems reviewed and are negative.   Blood pressure 135/64, pulse 68, temperature 98.2 F (36.8 C), temperature source Oral, resp. rate 16, height  (1.854 m), weight 94.8 kg, SpO2 96 %.Body mass index is 27.57 kg/m.  General Appearance: Bizarre  Eye Contact:  Fair  Speech:  Blocked  Volume:  Normal  Mood:  Irritable  Affect:  Non-Congruent and laughing inappropriately  Thought Process:  Disorganized and Descriptions of Associations: Tangential  Orientation:  Other:  person  Thought Content:  Illogical, Hallucinations: Auditory, Ideas of Reference:   Paranoia and Tangential  Suicidal Thoughts:  No  Homicidal Thoughts:  No  Memory:  Immediate;   Poor Recent;   Poor Remote;   Poor  Judgement:  Impaired  Insight:  Lacking  Psychomotor Activity:  Normal  Concentration:  Concentration: Fair and Attention Span: Fair  Recall:  Poor  Fund of Knowledge:  Poor  Language:  Good  Akathisia:  Negative  Handed:  Right  AIMS (if indicated):     Assets:  Architect Housing  ADL's:  Intact  Cognition:  WNL  Sleep:        Treatment Plan Summary: Daily contact with patient to assess and evaluate symptoms  and progress in treatment, Medication management and Plan Admit to inpatient psychiatric treatment  Disposition: Recommend psychiatric Inpatient admission when medically cleared.  This service was provided via telemedicine using a 2-way, interactive audio and video technology.  Names of all persons participating in this telemedicine service and their role in this encounter. Name: Carlos [name] Role: Patient  Name: Elta Guadeloupe Role: FNP-C  Name:  Role:   Name:  Role:     Laveda Abbe, NP 01/20/2019 11:52 AM  Patient seen face-to-face for psychiatric evaluation, chart reviewed and case discussed with the physician extender and developed treatment plan. Reviewed the information documented and agree with the treatment plan. Thedore Mins, MD

## 2019-01-20 NOTE — ED Notes (Addendum)
Patient starting to ask questions about his discharge.  Appears to be anxious and wanting to go home.  Nurse was going to talk with him but patient wanted to talk to "Dr. [name]."  He continues to pace in the unit.

## 2019-01-20 NOTE — ED Notes (Signed)
Patient requesting cold packs and hot packs for his knee and back.

## 2019-01-20 NOTE — ED Notes (Signed)
Patient refused to take any po medications except melatonin saying, "I don't need geodon."  Nurse continued to encourage patient to take his mediations but he continued to refuse.
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
## 2019-01-20 NOTE — ED Notes (Signed)
Patient now lying in bed resting.

## 2019-01-20 NOTE — ED Notes (Signed)
Patient continues to walk around the unit.  He took his po medications after talking with counselor [name].  Patient asked to take another shower but this request was denied due to patient having had a shower earlier this morning.

## 2019-01-20 NOTE — ED Notes (Signed)
Patient resting in bed now.  He became upset that patients were given white masks and staff had blue masks.  Patient wanted a blue mask.

## 2019-01-20 NOTE — ED Notes (Signed)
Patient having his assessment this morning with psychiatrist via telepsych with counselor [name] at bedside.

## 2019-01-20 NOTE — ED Notes (Signed)
Patient awake now standing at the sink just looking at the wash cloth sitter gave him.  Patient standing there for a while and unable to be redirected back to his bed.  When nurse greeted patient and asked if she could turn on the light patient replied "no" in a gruff voice.  Patient continues to flush toilet then go to the sink and run water over and over.

## 2019-01-20 NOTE — Progress Notes (Signed)
Received Pope in his room asleep with the sitter at the bedside. He woke up, c/o pain in his lower back and was medicated per order. He also was compliant with his nighttime medications. He continued to endorse hearing his mothers voice talking to him. He is calm and cooperative at this time.He woke up early AM for approximately one hour before he returned to bed and drifted off to sleep.

## 2019-01-20 NOTE — ED Notes (Signed)
Patient back in his room resting in bed.

## 2019-01-20 NOTE — ED Notes (Signed)
Patient taking a shower.  He continues to be irritable in his demeanor but is calm at this time.  Sitter noted that patient had spit all over his floor.

## 2019-01-21 DIAGNOSIS — S0011XA Contusion of right eyelid and periocular area, initial encounter: Secondary | ICD-10-CM | POA: Diagnosis not present

## 2019-01-21 NOTE — ED Notes (Signed)
Pt calm and cooperative, pleasant, likes to socialize.  Seems a little disorganized or tangential, but not sure of pt's baseline.

## 2019-01-21 NOTE — ED Notes (Signed)
Pt continues to be calm and cooperative. Not as restless as he was this morning and not as irritable.

## 2019-01-21 NOTE — Progress Notes (Signed)
Received Carlos [name] in his room watching TV. He is calm and directable at this time. He was compliant with his medications and slept throughout the night except for briefs visits to the bathroom.

## 2019-01-21 NOTE — ED Notes (Signed)
Pt alert, irritable, but cooperative. Took medications after being told what they are and what they are for.  Restless and wants to stand at the nurse's station and talk with his sitter and security. This is being allowed within reason.  Wears a mask.

## 2019-01-21 NOTE — Consult Note (Addendum)
Telepsych Consultation   Reason for Consult:  Auditory hallucinations Referring Physician:  EDP Location of Patient:  Location of Provider: [facility]  Patient Identification: Carlos [name] MRN:  [id number] Principal Diagnosis: Disorganized schizophrenia [facility]) Diagnosis:  Principal Problem:   Disorganized schizophrenia ([place]) Active Problems:   Cannabis use disorder, severe, dependence ([place])   Total Time spent with patient: 30 minutes  Subjective:   Carlos [name] is a 28 y.o. male patient admitted with disorganized behavior and paranoia.  HPI:  Pt was seen and chart reviewed with treatment team and Dr Darleene Cleaver. Pt denies suicidal/homicidal ideation. Pt's SARS swab is negative. Pt has a history of Schizophrenia. He has not been compliant with his medications. His UDS is positive for THC, BAL negative. He stated he lives alone and receives SSDI for his Schizophrenia.  Today on evaluation: Pt is sitting in his room watching TV with the lights out. He stated he was good today and asked how long it takes to get a bed somewhere. He is less disorganized today. He has been taking his medications as prescribed. He does pace the unit at times and wants to talk to the nurses but he is easily redirectable. He has not had to receive any emergent medications for his behavior. He was placed under IVC yesterday due to not agreeing to a voluntary hospital admission. Pt continues to require inpatient psychiatric hospitalization. TTS to seek placement. No appropriate beds at [facility] at this time.    Past Psychiatric History: As above  Risk to Self: Suicidal Ideation: No(Pt denies. ) Suicidal Intent: No Is patient at risk for suicide?: No Suicidal Plan?: No(Pt denies. ) Access to Means: Yes Specify Access to Suicidal Means: Gun and knives.  What has been your use of drugs/alcohol within the last 12 months?: Marijuana, cigarettes.  How many times?: 0 Other Self Harm Risks:  NA Triggers for Past Attempts: None known Intentional Self Injurious Behavior: None(Pt denies. ) Risk to Others: Homicidal Ideation: No(Pt denies. ) Thoughts of Harm to Others: No(Pt denies. ) Current Homicidal Intent: No Current Homicidal Plan: No Access to Homicidal Means: No Identified Victim: NA History of harm to others?: No Assessment of Violence: None Noted Violent Behavior Description: NA Does patient have access to weapons?: No Criminal Charges Pending?: No Does patient have a court date: No Prior Inpatient Therapy: Prior Inpatient Therapy: No(Pt denies. ) Prior Outpatient Therapy: Prior Outpatient Therapy: No(Pt denies. ) Does patient have an ACCT team?: No Does patient have Intensive In-House Services?  : No Does patient have [facility] services? : No Does patient have P4CC services?: No  Past Medical History:  Past Medical History:  Diagnosis Date  . Depression   . Psychotic disorder   . PTSD (post-traumatic stress disorder)    No past surgical history on file. Family History:  Family History  Problem Relation Age of Onset  . Mental illness Brother    Family Psychiatric  History: Pt did not provide this information Social History:  Social History   Substance and Sexual Activity  Alcohol Use No     Social History   Substance and Sexual Activity  Drug Use Yes  . Types: Cocaine, Marijuana, [name], IV    Social History   Socioeconomic History  . Marital status: Single    Spouse name: Not on file  . Number of children: Not on file  . Years of education: Not on file  . Highest education level: Not on file  Occupational History  .  Not on file  Social Needs  . Financial resource strain: Not on file  . Food insecurity    Worry: Not on file    Inability: Not on file  . Transportation needs    Medical: Not on file    Non-medical: Not on file  Tobacco Use  . Smoking status: Current Every Day Smoker    Packs/day: 3.00    Types: Cigarettes  .  Smokeless tobacco: Never Used  Substance and Sexual Activity  . Alcohol use: No  . Drug use: Yes    Types: Cocaine, Marijuana, [name], IV  . Sexual activity: Yes    Birth control/protection: Condom  Lifestyle  . Physical activity    Days per week: Not on file    Minutes per session: Not on file  . Stress: Not on file  Relationships  . Social Musician on phone: Not on file    Gets together: Not on file    Attends religious service: Not on file    Active member of club or organization: Not on file    Attends meetings of clubs or organizations: Not on file    Relationship status: Not on file  Other Topics Concern  . Not on file  Social History Narrative  . Not on file   Additional Social History:    Allergies:   Allergies  Allergen Reactions  . Depakote [Divalproex Sodium] Swelling    Tongue swells  . Invega [Paliperidone Er] Swelling  . Other Other (See Comments)    Patient stated no injections    Labs:  Results for orders placed or performed during the hospital encounter of 01/19/19 (from the past 48 hour(s))  Comprehensive metabolic panel     Status: Abnormal   Collection Time: 01/19/19  5:55 PM  Result Value Ref Range   Sodium 140 135 - 145 mmol/L   Potassium 3.7 3.5 - 5.1 mmol/L   Chloride 107 98 - 111 mmol/L   CO2 24 22 - 32 mmol/L   Glucose, Bld 128 (H) 70 - 99 mg/dL   BUN 8 6 - 20 mg/dL   Creatinine, Ser 4.09 0.61 - 1.24 mg/dL   Calcium 8.9 8.9 - 81.1 mg/dL   Total Protein 6.9 6.5 - 8.1 g/dL   Albumin 3.9 3.5 - 5.0 g/dL   AST 914 (H) 15 - 41 U/L   ALT 74 (H) 0 - 44 U/L   Alkaline Phosphatase 59 38 - 126 U/L   Total Bilirubin 0.7 0.3 - 1.2 mg/dL   GFR calc non Af Amer >60 >60 mL/min   GFR calc Af Amer >60 >60 mL/min   Anion gap 9 5 - 15    Comment: Performed at [facility], [address], [place], [place] [address]  Ethanol     Status: None   Collection Time: 01/19/19  5:55 PM  Result Value Ref Range    Alcohol, Ethyl (B) <10 <10 mg/dL    Comment: (NOTE) Lowest detectable limit for serum alcohol is 10 mg/dL. For medical purposes only. Performed at [facility], [address], [place], [place] [address]   Urine rapid drug screen (hosp performed)     Status: Abnormal   Collection Time: 01/19/19  5:55 PM  Result Value Ref Range   Opiates NONE DETECTED NONE DETECTED   Cocaine NONE DETECTED NONE DETECTED   Benzodiazepines NONE DETECTED NONE DETECTED   Amphetamines NONE DETECTED NONE DETECTED   Tetrahydrocannabinol POSITIVE (A) NONE  DETECTED   Barbiturates NONE DETECTED NONE DETECTED    Comment: (NOTE) DRUG SCREEN FOR MEDICAL PURPOSES ONLY.  IF CONFIRMATION IS NEEDED FOR ANY PURPOSE, NOTIFY LAB WITHIN 5 DAYS. LOWEST DETECTABLE LIMITS FOR URINE DRUG SCREEN Drug Class                     Cutoff (ng/mL) Amphetamine and metabolites    1000 Barbiturate and metabolites    200 Benzodiazepine                 200 Tricyclics and metabolites     300 Opiates and metabolites        300 Cocaine and metabolites        300 THC                            50 Performed at [facility], [address] [place], [place] [address]   CBC with Diff     Status: Abnormal   Collection Time: 01/19/19  5:55 PM  Result Value Ref Range   WBC 11.0 (H) 4.0 - 10.5 K/uL   RBC 5.12 4.22 - 5.81 MIL/uL   Hemoglobin 15.6 13.0 - 17.0 g/dL   HCT 19.1 47.8 - 29.5 %   MCV 90.0 80.0 - 100.0 fL   MCH 30.5 26.0 - 34.0 pg   MCHC 33.8 30.0 - 36.0 g/dL   RDW 62.1 30.8 - 65.7 %   Platelets 271 150 - 400 K/uL   nRBC 0.0 0.0 - 0.2 %   Neutrophils Relative % 78 %   Neutro Abs 8.6 (H) 1.7 - 7.7 K/uL   Lymphocytes Relative 14 %   Lymphs Abs 1.5 0.7 - 4.0 K/uL   Monocytes Relative 6 %   Monocytes Absolute 0.6 0.1 - 1.0 K/uL   Eosinophils Relative 1 %   Eosinophils Absolute 0.1 0.0 - 0.5 K/uL   Basophils Relative 1 %   Basophils Absolute 0.1 0.0 - 0.1 K/uL   Immature Granulocytes 0  %   Abs Immature Granulocytes 0.04 0.00 - 0.07 K/uL    Comment: Performed at [facility], [address], [place], [place] [address]  SARS Coronavirus 2 (CEPHEID - Performed in [facility] hospital lab), Hosp Order     Status: None   Collection Time: 01/19/19  5:55 PM   Specimen: Nasopharyngeal Swab  Result Value Ref Range   SARS Coronavirus 2 NEGATIVE NEGATIVE    Comment: (NOTE) If result is NEGATIVE SARS-CoV-2 target nucleic acids are NOT DETECTED. The SARS-CoV-2 RNA is generally detectable in upper and lower  respiratory specimens during the acute phase of infection. The lowest  concentration of SARS-CoV-2 viral copies this assay can detect is 250  copies / mL. A negative result does not preclude SARS-CoV-2 infection  and should not be used as the sole basis for treatment or other  patient management decisions.  A negative result may occur with  improper specimen collection / handling, submission of specimen other  than nasopharyngeal swab, presence of viral mutation(s) within the  areas targeted by this assay, and inadequate number of viral copies  (<250 copies / mL). A negative result must be combined with clinical  observations, patient history, and epidemiological information. If result is POSITIVE SARS-CoV-2 target nucleic acids are DETECTED. The SARS-CoV-2 RNA is generally detectable in upper and lower  respiratory specimens dur ing the acute phase of infection.  Positive  results  are indicative of active infection with SARS-CoV-2.  Clinical  correlation with patient history and other diagnostic information is  necessary to determine patient infection status.  Positive results do  not rule out bacterial infection or co-infection with other viruses. If result is PRESUMPTIVE POSTIVE SARS-CoV-2 nucleic acids MAY BE PRESENT.   A presumptive positive result was obtained on the submitted specimen  and confirmed on repeat testing.  While 2019 novel coronavirus   (SARS-CoV-2) nucleic acids may be present in the submitted sample  additional confirmatory testing may be necessary for epidemiological  and / or clinical management purposes  to differentiate between  SARS-CoV-2 and other Sarbecovirus currently known to infect humans.  If clinically indicated additional testing with an alternate test  methodology [phone]) is advised. The SARS-CoV-2 RNA is generally  detectable in upper and lower respiratory sp ecimens during the acute  phase of infection. The expected result is Negative. Fact Sheet for Patients:  BoilerBrush.com.cyhttps://www.fda.gov/media/136312/download Fact Sheet for Healthcare Providers: https://pope.com/https://www.fda.gov/media/136313/download This test is not yet approved or cleared by the [place] FDA and has been authorized for detection and/or diagnosis of SARS-CoV-2 by FDA under an Emergency Use Authorization (EUA).  This EUA will remain in effect (meaning this test can be used) for the duration of the COVID-19 declaration under Section 564(b)(1) of the Act, 21 U.S.C. section 360bbb-3(b)(1), unless the authorization is terminated or revoked sooner. Performed at [facility], [address] [place], [place] [address]   Hepatitis panel, acute     Status: None   Collection Time: 01/19/19  5:55 PM  Result Value Ref Range   Hepatitis B Surface Ag Negative Negative   HCV Ab <0.1 0.0 - 0.9 s/co ratio    Comment: (NOTE)                                  Negative:     < 0.8                             Indeterminate: 0.8 - 0.9                                  Positive:     > 0.9 The CDC recommends that a positive HCV antibody result be followed up with a HCV Nucleic Acid Amplification test ([id number]). Performed At: [facility] [place] [address] [place], [place] [id number] Jolene SchimkeNagendra Sanjai MD GE:[id number]:[phone]    Hep A IgM Negative Negative   Hep B C IgM Negative Negative    Medications:  Current Facility-Administered Medications   Medication Dose Route Frequency Provider Last Rate Last Dose  . benztropine (COGENTIN) tablet 0.5 mg  0.5 mg Oral BID Raeford RazorKohut, Stephen, MD   0.5 mg at 01/21/19 0802  . carbamazepine (TEGRETOL) tablet 200 mg  200 mg Oral BID PC Raeford RazorKohut, Stephen, MD   200 mg at 01/21/19 0802  . hydrOXYzine (ATARAX/VISTARIL) tablet 25 mg  25 mg Oral Q6H PRN Terrilee FilesButler, Michael C, MD   25 mg at 01/19/19 2128  . ibuprofen (ADVIL) tablet 600 mg  600 mg Oral Q6H PRN Benjiman CorePickering, Nathan, MD   600 mg at 01/21/19 1109  . Melatonin TABS 2 mg  2 mg Oral QHS PRN Terrilee FilesButler, Michael C, MD   2 mg at 01/19/19 2137  . traZODone (DESYREL)  tablet 100 mg  100 mg Oral QHS Raeford RazorKohut, Stephen, MD   100 mg at 01/20/19 2048  . ziprasidone (GEODON) capsule 80 mg  80 mg Oral BID WC Shaune Malacara, MD   80 mg at 01/21/19 0802   Current Outpatient Medications  Medication Sig Dispense Refill  . Melatonin 1 MG TABS Take 2 mg by mouth at bedtime as needed (sleep).     . benztropine (COGENTIN) 0.5 MG tablet Take 1 tablet (0.5 mg total) by mouth 2 (two) times daily. For drug induced tremors (Patient not taking: Reported on 01/19/2019) 60 tablet 0  . carbamazepine (TEGRETOL) 200 MG tablet Take 1 tablet (200 mg total) by mouth 2 (two) times daily after a meal. For mood stabilization (Patient not taking: Reported on 01/19/2019) 60 tablet 0  . hydrOXYzine (ATARAX/VISTARIL) 25 MG tablet Take 1 tablet (25 mg total) by mouth every 6 (six) hours as needed for anxiety. (Patient not taking: Reported on 01/19/2019) 60 tablet 0  . prazosin (MINIPRESS) 2 MG capsule Take 1 capsule (2 mg total) by mouth at bedtime. For nightmares (Patient not taking: Reported on 01/19/2019) 30 capsule 0  . traZODone (DESYREL) 100 MG tablet Take 1 tablet (100 mg total) by mouth at bedtime. For insomnia (Patient not taking: Reported on 01/19/2019) 30 tablet 0  . ziprasidone (GEODON) 20 MG capsule Take 5 capsules (100 mg total) by mouth daily with supper. For mood control (Patient not taking: Reported  on 01/19/2019) 150 capsule 0  . ziprasidone (GEODON) 80 MG capsule Take 1 capsule (80 mg total) by mouth daily with breakfast. For mood control (Patient not taking: Reported on 01/19/2019) 30 capsule 0    Musculoskeletal: Strength & Muscle Tone: within normal limits Gait & Station: normal Patient leans: N/A  Psychiatric Specialty Exam: Physical Exam  Constitutional: He appears well-developed and well-nourished.  HENT:  Head: Normocephalic.  Respiratory: Effort normal.  Musculoskeletal: Normal range of motion.  Neurological: He is alert.  Psychiatric: His speech is tangential. He is actively hallucinating. Thought content is paranoid. Cognition and memory are impaired.    Review of Systems  Psychiatric/Behavioral: Positive for hallucinations (auditory).  All other systems reviewed and are negative.   Blood pressure (!) 155/87, pulse 76, temperature 98.1 F (36.7 C), temperature source Oral, resp. rate 18, height 6\' 1"  (1.854 m), weight 94.8 kg, SpO2 97 %.Body mass index is 27.57 kg/m.  General Appearance: Casual  Eye Contact:  Fair  Speech:  Clear and Coherent  Volume:  Normal  Mood:  Euthymic, pacing the unit at times but not intrusive  Affect:  Non-Congruent and laughing inappropriately at times  Thought Process:  Disorganized at times, better today  Orientation:  Full (Time, Place, and Person)  Thought Content:  Illogical, Hallucinations: Auditory and Ideas of Reference:   Paranoia  Suicidal Thoughts:  No  Homicidal Thoughts:  No  Memory:  Immediate;   Poor Recent;   Poor Remote;   Poor  Judgement:  Impaired  Insight:  Lacking  Psychomotor Activity:  Normal  Concentration:  Concentration: Fair and Attention Span: Fair  Recall:  Poor  Fund of Knowledge:  Poor  Language:  Good  Akathisia:  Negative  Handed:  Right  AIMS (if indicated):     Assets:  ArchitectCommunication Skills Financial Resources/Insurance Housing  ADL's:  Intact  Cognition:  WNL  Sleep:         Treatment Plan Summary: Daily contact with patient to assess and evaluate symptoms and progress in treatment, Medication  management and Plan Admit to inpatient psychiatric treatment  Disposition: Recommend psychiatric Inpatient admission when medically cleared. TTS to seek placement. [facility].   This service was provided via telemedicine using a 2-way, interactive audio and video technology.  Names of all persons participating in this telemedicine service and their role in this encounter. Name: Carlos [name] Role: Patient  Name: Elta GuadeloupeLaurie Parks Role: FNP-C  Name:  Role:   Name:  Role:     Laveda AbbeLaurie Britton Parks, NP 01/21/2019 11:51 AM  Patient seen face-to-face for psychiatric evaluation, chart reviewed and case discussed with the physician extender and developed treatment plan. Reviewed the information documented and agree with the treatment plan. Thedore MinsMojeed Arabel Barcenas, MD

## 2019-01-22 DIAGNOSIS — S0011XA Contusion of right eyelid and periocular area, initial encounter: Secondary | ICD-10-CM | POA: Diagnosis not present

## 2019-01-22 DIAGNOSIS — F201 Disorganized schizophrenia: Secondary | ICD-10-CM

## 2019-01-22 NOTE — BH Assessment (Signed)
[facility] Assessment Progress Note  Per Hampton Abbot, MD, this pt does not require psychiatric hospitalization at this time.  Pt presents under IVC initiated by EDP Aletta Edouard, MD, which Dr Dwyane Dee has rescinded.  Pt is to be discharged from [facility] with recommendation to continue treatment with the [facility] Team.  This has been included in pt's discharge instructions.  The [facility] ACT Team has been notified by telephone at 10:46.  Pt's nurse, Caren Griffins, has also been notified.  Jalene Mullet, [place] Triage Specialist [phone]

## 2019-01-22 NOTE — Progress Notes (Signed)
**  Patient received new scrubs and supplies to take a shower after breakfast as requested .

## 2019-01-22 NOTE — ED Notes (Signed)
Patient getting dressed and will be going home via bus.  Patient calm and cooperative, denies hallucinations, or suicidal thoughts.  Patient to follow up with [facility] for medication management.  Encouraged patient to take his medications at home.

## 2019-01-22 NOTE — Consult Note (Signed)
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 He feels that he is ready to go. He denies any suicidal or homicidal ideations. He also denies any hallucinations. He reports that he lives alone and has his own housing. He reports that he has [facility] that follow up with him. He denies any family support in the area.   Objective: Patient is presenting calm and cooperative. He has been complaint with medications. He is requesting to leave. His [facility] was contacted and they stated they would like him to be inpatient because he is difficult to contact and he refuses his medications sometimes. They are informed that he does not meet inpatient criteria and they state understanding and they will continue to follow up with him. They state they cannot pick him up due to COVID 19.    Total Time spent with patient: 30 minutes  Past Psychiatric History: Paranoid schizophrenia, [facility]  Past Medical History:  Past Medical History:  Diagnosis Date  . Depression   . Psychotic disorder   . PTSD (post-traumatic stress disorder)    No past surgical history on file. Family History:  Family History  Problem Relation Age of Onset  . Mental illness Brother    Family Psychiatric  History: Bother has unknown mental illness Social History:  Social History   Substance and Sexual Activity  Alcohol Use No     Social History   Substance and Sexual Activity  Drug Use Yes  . Types: Cocaine, Marijuana, [name], IV    Social History   Socioeconomic History  . Marital status: Single    Spouse name: Not on file  . Number of children: Not  on file  . Years of education: Not on file  . Highest education level: Not on file  Occupational History  . Not on file  Social Needs  . Financial resource strain: Not on file  . Food insecurity    Worry: Not on file    Inability: Not on file  . Transportation needs    Medical: Not on file    Non-medical: Not on file  Tobacco Use  . Smoking status: Current Every Day Smoker    Packs/day: 3.00    Types: Cigarettes  . Smokeless tobacco: Never Used  Substance and Sexual Activity  . Alcohol use: No  . Drug use: Yes    Types: Cocaine, Marijuana, [name], IV  . Sexual activity: Yes    Birth control/protection: Condom  Lifestyle  . Physical activity    Days per week: Not on file    Minutes per session: Not on file  . Stress: Not on file  Relationships  . Social Musicianconnections    Talks on phone: Not on file    Gets together: Not on file    Attends religious service: Not on file    Active member of club or organization: Not on file    Attends meetings of clubs or organizations: Not on file    Relationship status: Not on file  Other Topics Concern  . Not on file  Social History Narrative  . Not on file    Has this patient used any form of tobacco in the  last 30 days? (Cigarettes, Smokeless Tobacco, Cigars, and/or Pipes) A prescription for an FDA-approved tobacco cessation medication was offered at discharge and the patient refused  Current Medications: Current Facility-Administered Medications  Medication Dose Route Frequency Provider Last Rate Last Dose  . benztropine (COGENTIN) tablet 0.5 mg  0.5 mg Oral BID Virgel Manifold, MD   0.5 mg at 01/22/19 0949  . carbamazepine (TEGRETOL) tablet 200 mg  200 mg Oral BID PC Virgel Manifold, MD   200 mg at 01/22/19 0240  . hydrOXYzine (ATARAX/VISTARIL) tablet 25 mg  25 mg Oral Q6H PRN Hayden Rasmussen, MD   25 mg at 01/19/19 2128  . ibuprofen (ADVIL) tablet 600 mg  600 mg Oral Q6H PRN Davonna Belling, MD   600 mg at 01/22/19  9735  . Melatonin TABS 2 mg  2 mg Oral QHS PRN Hayden Rasmussen, MD   2 mg at 01/19/19 2137  . traZODone (DESYREL) tablet 100 mg  100 mg Oral QHS Virgel Manifold, MD   100 mg at 01/21/19 2202  . ziprasidone (GEODON) capsule 80 mg  80 mg Oral BID WC Akintayo, Mojeed, MD   80 mg at 01/22/19 3299   Current Outpatient Medications  Medication Sig Dispense Refill  . Melatonin 1 MG TABS Take 2 mg by mouth at bedtime as needed (sleep).     . benztropine (COGENTIN) 0.5 MG tablet Take 1 tablet (0.5 mg total) by mouth 2 (two) times daily. For drug induced tremors (Patient not taking: Reported on 01/19/2019) 60 tablet 0  . carbamazepine (TEGRETOL) 200 MG tablet Take 1 tablet (200 mg total) by mouth 2 (two) times daily after a meal. For mood stabilization (Patient not taking: Reported on 01/19/2019) 60 tablet 0  . hydrOXYzine (ATARAX/VISTARIL) 25 MG tablet Take 1 tablet (25 mg total) by mouth every 6 (six) hours as needed for anxiety. (Patient not taking: Reported on 01/19/2019) 60 tablet 0  . prazosin (MINIPRESS) 2 MG capsule Take 1 capsule (2 mg total) by mouth at bedtime. For nightmares (Patient not taking: Reported on 01/19/2019) 30 capsule 0  . traZODone (DESYREL) 100 MG tablet Take 1 tablet (100 mg total) by mouth at bedtime. For insomnia (Patient not taking: Reported on 01/19/2019) 30 tablet 0  . ziprasidone (GEODON) 20 MG capsule Take 5 capsules (100 mg total) by mouth daily with supper. For mood control (Patient not taking: Reported on 01/19/2019) 150 capsule 0  . ziprasidone (GEODON) 80 MG capsule Take 1 capsule (80 mg total) by mouth daily with breakfast. For mood control (Patient not taking: Reported on 01/19/2019) 30 capsule 0   PTA Medications: (Not in a hospital admission)   Musculoskeletal: Strength & Muscle Tone: within normal limits Gait & Station: normal Patient leans: N/A  Psychiatric Specialty Exam: Physical Exam  Nursing note and vitals reviewed. Constitutional: He is oriented to  person, place, and time. He appears well-developed and well-nourished.  Cardiovascular: Normal rate.  Respiratory: Effort normal.  Musculoskeletal: Normal range of motion.  Neurological: He is alert and oriented to person, place, and time.  Skin: Skin is warm.    Review of Systems  Constitutional: Negative.   HENT: Negative.   Eyes: Negative.   Respiratory: Negative.   Cardiovascular: Negative.   Gastrointestinal: Negative.   Genitourinary: Negative.   Musculoskeletal: Negative.   Skin: Negative.   Neurological: Negative.   Endo/Heme/Allergies: Negative.   Psychiatric/Behavioral: Negative.     Blood pressure 117/69, pulse (!) 58, temperature 98.5 F (36.9 C), temperature source Oral,  resp. rate 18, height 6\' 1"  (1.854 m), weight 94.8 kg, SpO2 96 %.Body mass index is 27.57 kg/m.  General Appearance: Casual  Eye Contact:  Good  Speech:  Clear and Coherent and Normal Rate  Volume:  Increased  Mood:  Euthymic and Irritable  Affect:  Congruent  Thought Process:  Coherent and Descriptions of Associations: Intact  Orientation:  Full (Time, Place, and Person)  Thought Content:  WDL  Suicidal Thoughts:  No  Homicidal Thoughts:  No  Memory:  Immediate;   Good Recent;   Good Remote;   Good  Judgement:  Fair  Insight:  Fair  Psychomotor Activity:  Normal  Concentration:  Concentration: Fair  Recall:  Fair  Fund of Knowledge:  Fair  Language:  Good  Akathisia:  No  Handed:  Right  AIMS (if indicated):     Assets:  Communication Skills Desire for Improvement Financial Resources/Insurance Housing Physical Health Resilience Social Support Transportation  ADL's:  Intact  Cognition:  WNL  Sleep:        Demographic Factors:  Male, Low socioeconomic status, Living alone and Unemployed  Loss Factors: NA  Historical Factors: NA  Risk Reduction Factors:   Positive social support and Positive therapeutic relationship  Continued Clinical Symptoms:  Schizophrenia:    Paranoid or undifferentiated type  Cognitive Features That Contribute To Risk:  None    Suicide Risk:  Mild:  Suicidal ideation of limited frequency, intensity, duration, and specificity.  There are no identifiable plans, no associated intent, mild dysphoria and related symptoms, good self-control (both objective and subjective assessment), few other risk factors, and identifiable protective factors, including available and accessible social support.    Plan Of Care/Follow-up recommendations:  Activity:  resume normal activity Diet:  resume low fat diet  Disposition: Patient does not meet inpatient criteria and is psychiatrically cleared. [facility]  Patient will continue current home medications that [facility]. ACTT has been contacted and will follow up with patient after discharge.  Gerlene Burdockravis B Mahnoor Mathisen, FNP 01/22/2019, 10:38 AM

## 2019-01-22 NOTE — ED Notes (Signed)
When passing medications patient volunteered to nurse that he is ready to go home. He continued to say he was not seeing or hearing things anymore.  Patient did pace some last night but is staying in his room this morning.  Day before yesterday when this writer was his nurse he paced for much of the day.

## 2019-01-22 NOTE — ED Notes (Signed)
Patient requested ibuprofen for his chronic leg pain.  Rates pain as a 7/10.  His back pain he rates as an 8/10.

## 2019-01-22 NOTE — Progress Notes (Signed)
CSW received call from [facility] about patient needing placement. CSW took information down from caller Ebony Hail [phone]) and passed it along to TTS who is currently working on inpatient placement for patient.   [place], LCSW Transitions of Care Department [facility] ED [phone]

## 2019-01-22 NOTE — Discharge Instructions (Signed)
For your behavioral health needs, you are advised to continue treatment with the [facility]:       [facility]      [address]      [place], [place] [address]      [phone]      Crisis number: [phone]    

## 2019-04-11 ENCOUNTER — Other Ambulatory Visit: Payer: Self-pay

## 2019-04-11 ENCOUNTER — Ambulatory Visit (HOSPITAL_COMMUNITY)
Admission: EM | Admit: 2019-04-11 | Discharge: 2019-04-11 | Disposition: A | Discharge status: Home / Self Care | Payer: Medicare Other | Attending: Psychiatry | Admitting: Psychiatry

## 2019-04-11 ENCOUNTER — Emergency Department (HOSPITAL_COMMUNITY)
Admission: EM | Admit: 2019-04-11 | Discharge: 2019-04-12 | Disposition: A | Discharge status: Other Acute Inpatient Hospital | Payer: Medicare Other | Attending: Emergency Medicine | Admitting: Emergency Medicine

## 2019-04-11 ENCOUNTER — Encounter (HOSPITAL_COMMUNITY): Payer: Self-pay | Admitting: Emergency Medicine

## 2019-04-11 DIAGNOSIS — N179 Acute kidney failure, unspecified: Secondary | ICD-10-CM | POA: Insufficient documentation

## 2019-04-11 DIAGNOSIS — E860 Dehydration: Secondary | ICD-10-CM | POA: Insufficient documentation

## 2019-04-11 DIAGNOSIS — Z046 Encounter for general psychiatric examination, requested by authority: Secondary | ICD-10-CM | POA: Insufficient documentation

## 2019-04-11 DIAGNOSIS — Z008 Encounter for other general examination: Secondary | ICD-10-CM

## 2019-04-11 DIAGNOSIS — F209 Schizophrenia, unspecified: Secondary | ICD-10-CM | POA: Diagnosis present

## 2019-04-11 DIAGNOSIS — F200 Paranoid schizophrenia: Secondary | ICD-10-CM

## 2019-04-11 LAB — COMPREHENSIVE METABOLIC PANEL
ALT: 32 U/L (ref 0–44)
AST: 35 U/L (ref 15–41)
Albumin: 4.4 g/dL (ref 3.5–5.0)
Alkaline Phosphatase: 59 U/L (ref 38–126)
Anion gap: 16 — ABNORMAL HIGH (ref 5–15)
BUN: 16 mg/dL (ref 6–20)
CO2: 15 mmol/L — ABNORMAL LOW (ref 22–32)
Calcium: 9.8 mg/dL (ref 8.9–10.3)
Chloride: 108 mmol/L (ref 98–111)
Creatinine, Ser: 1.84 mg/dL — ABNORMAL HIGH (ref 0.61–1.24)
GFR calc Af Amer: 56 mL/min — ABNORMAL LOW (ref >60)
GFR calc non Af Amer: 48 mL/min — ABNORMAL LOW (ref >60)
Glucose, Bld: 142 mg/dL — ABNORMAL HIGH (ref 70–99)
Potassium: 4.2 mmol/L (ref 3.5–5.1)
Sodium: 139 mmol/L (ref 135–145)
Total Bilirubin: 1.4 mg/dL — ABNORMAL HIGH (ref 0.3–1.2)
Total Protein: 7.7 g/dL (ref 6.5–8.1)

## 2019-04-11 LAB — ETHANOL: Alcohol, Ethyl (B): <10 mg/dL (ref <10)

## 2019-04-11 LAB — CBC
HCT: 54.7 % — ABNORMAL HIGH (ref 39.0–52.0)
Hemoglobin: 18.1 g/dL — ABNORMAL HIGH (ref 13.0–17.0)
MCH: 30 pg (ref 26.0–34.0)
MCHC: 33.1 g/dL (ref 30.0–36.0)
MCV: 90.7 fL (ref 80.0–100.0)
Platelets: 346 10*3/uL (ref 150–400)
RBC: 6.03 MIL/uL — ABNORMAL HIGH (ref 4.22–5.81)
RDW: 13.8 % (ref 11.5–15.5)
WBC: 9.4 10*3/uL (ref 4.0–10.5)
nRBC: 0 % (ref 0.0–0.2)

## 2019-04-11 LAB — SALICYLATE LEVEL: Salicylate Lvl: <7 mg/dL (ref 2.8–30.0)

## 2019-04-11 LAB — ACETAMINOPHEN LEVEL: Acetaminophen (Tylenol), Serum: <10 ug/mL — ABNORMAL LOW (ref 10–30)

## 2019-04-11 MED ORDER — SODIUM CHLORIDE 0.9 % IV BOLUS
2000.0000 mL | Freq: Once | INTRAVENOUS | Status: AC
  Administered 2019-04-12: 02:00:00 2000 mL via INTRAVENOUS

## 2019-04-11 NOTE — ED Notes (Signed)
Unable to complete Psych Assessment, pt refusing to talk or answer questions.

## 2019-04-11 NOTE — BH Assessment (Addendum)
Assessment Note  Carlos FlakesDerrick Xavier [name] is an 28 y.o. male. Pt was poor historian. Pt would not answer the assessment questions. Pt is displaying psychosis. The Pt states that his ACTT team [facility] because he is not taking his medication. The Pt was argumentative and irriated.   The Pt was IVCd.   Shuvon, NP recommends inpatient treatment.  Diagnosis:  F20.9 Schizophrenia  Past Medical History:  Past Medical History:  Diagnosis Date  . Depression   . Psychotic disorder   . PTSD (post-traumatic stress disorder)     No past surgical history on file.  Family History:  Family History  Problem Relation Age of Onset  . Mental illness Brother     Social History:  reports that he has been smoking cigarettes. He has been smoking about 3.00 packs per day. He has never used smokeless tobacco. He reports current drug use. Drugs: Cocaine, Marijuana, [name], and IV. He reports that he does not drink alcohol.  Additional Social History:  Alcohol / Drug Use Pain Medications: please see mar Prescriptions: please see mar Over the Counter: please see mar History of alcohol / drug use?: No history of alcohol / drug abuse  CIWA: CIWA-Ar BP: 138/76 Pulse Rate: 94 COWS:    Allergies:  Allergies  Allergen Reactions  . Depakote [Divalproex Sodium] Swelling    Tongue swells  . Invega [Paliperidone Er] Swelling  . Other Other (See Comments)    Patient stated no injections    Home Medications: (Not in a hospital admission)   OB/GYN Status:  No LMP for male patient.  General Assessment Data [facility] T[facility] Assessment: In system Is this a Tele or Face-to-Face Assessment?: Face-to-Face Is this an Initial Assessment or a Re-assessment for this encounter?: Initial Assessment Patient Accompanied by:: N/A Language Other than English: No Living Arrangements: Other (Comment) What gender do you identify as?: Male Marital status:  Single Maiden name: NA Pregnancy Status: No Living Arrangements: Alone Can pt return to current living arrangement?: Yes Admission Status: Involuntary Petitioner: ED Attending Is patient capable of signing voluntary admission?: No Referral Source: Self/Family/Friend Insurance type: SP  Medical Screening Exam [facility] Walk-[facility] ONLY) Medical Exam completed: Yes  Crisis Care Plan Living Arrangements: Alone Legal Guardian: Other:(NA) Name of Psychiatrist: NA Name of Therapist: NA  Education Status Is patient currently in school?: No Is the patient employed, unemployed or receiving disability?: Unemployed  Risk to self with the past 6 months Suicidal Ideation: No Has patient been a risk to self within the past 6 months prior to admission? : No Suicidal Intent: No Has patient had any suicidal intent within the past 6 months prior to admission? : No Is patient at risk for suicide?: No Suicidal Plan?: No Has patient had any suicidal plan within the past 6 months prior to admission? : No Access to Means: No What has been your use of drugs/alcohol within the last 12 months?: NA Previous Attempts/Gestures: No How many times?: 0 Other Self Harm Risks: NA Triggers for Past Attempts: None known Intentional Self Injurious Behavior: None Family Suicide History: No Recent stressful life event(s): Other (Comment)(unknown) Persecutory voices/beliefs?: No Depression: No Depression Symptoms: (Pt would not report) Substance abuse history and/or treatment for substance abuse?: No Suicide prevention information given to non-admitted patients: Not applicable  Risk to Others within the past 6 months Homicidal Ideation: No Does patient have any lifetime risk of violence toward others beyond the six months prior to  admission? : No Thoughts of Harm to Others: No Current Homicidal Intent: No Current Homicidal Plan: No Access to Homicidal Means: No Identified Victim: NA History of harm to others?:  No Assessment of Violence: None Noted Violent Behavior Description: NA Does patient have access to weapons?: No Criminal Charges Pending?: No Does patient have a court date: No Is patient on probation?: No  Psychosis Hallucinations: None noted Delusions: None noted  Mental Status Report Appearance/Hygiene: Bizarre Eye Contact: Poor Motor Activity: Freedom of movement Speech: Tangential Level of Consciousness: Alert Mood: Anxious Affect: Anxious Anxiety Level: Moderate Thought Processes: Tangential, Flight of Ideas Judgement: Impaired Orientation: Not oriented Obsessive Compulsive Thoughts/Behaviors: None  Cognitive Functioning Concentration: Decreased Memory: Recent Impaired, Remote Impaired Is patient IDD: No Insight: Poor Impulse Control: Poor Appetite: Fair Have you had any weight changes? : No Change Sleep: Unable to Assess Vegetative Symptoms: None  ADLScreening [facility] Assessment Services) Patient's cognitive ability adequate to safely complete daily activities?: No Patient able to express need for assistance with ADLs?: No Independently performs ADLs?: Yes (appropriate for developmental age)  Prior Inpatient Therapy Prior Inpatient Therapy: Yes Prior Therapy Dates: unknown Prior Therapy Facilty/Provider(s): unknown Reason for Treatment: unknown  Prior Outpatient Therapy Prior Outpatient Therapy: Yes Prior Therapy Dates: current Prior Therapy Facilty/Provider(s): [facility] Reason for Treatment: Schizophrenia Does patient have an ACCT team?: No Does patient have Intensive In-House Services?  : No Does patient have [facility] services? : No Does patient have P4CC services?: No  ADL Screening (condition at time of admission) Patient's cognitive ability adequate to safely complete daily activities?: No Is the patient deaf or have difficulty hearing?: No Does the patient have difficulty seeing, even when wearing glasses/contacts?: No Does the patient have  difficulty concentrating, remembering, or making decisions?: Yes Patient able to express need for assistance with ADLs?: No Does the patient have difficulty dressing or bathing?: No Independently performs ADLs?: Yes (appropriate for developmental age)       Abuse/Neglect Assessment (Assessment to be complete while patient is alone) Abuse/Neglect Assessment Can Be Completed: Yes Physical Abuse: Denies Verbal Abuse: Denies Sexual Abuse: Denies Exploitation of patient/patient's resources: Denies     Regulatory affairs officer (For Healthcare) Does Patient Have a Medical Advance Directive?: No Would patient like information on creating a medical advance directive?: No - Patient declined          Disposition:  Disposition Initial Assessment Completed for this Encounter: Yes Disposition of Patient: Admit  On Site Evaluation by:   Reviewed with Physician:    Cyndia Bent 04/11/2019 5:16 PM

## 2019-04-11 NOTE — ED Triage Notes (Signed)
Patient arrived with 2 [employer] officers IVC from [facility] center , staff reported suicidal ideations , poor historian at arrival , will not answer triage nurse during encounter. History of psychosis and polysubstance abuse.

## 2019-04-11 NOTE — H&P (Signed)
[facility] Screening Exam  Carlos [name] is an 28 y.o. male patient presents to [facility] as a walk in stating "My ACTT team said I wasn't taking my medicine and called me psychotic."  Patient states that he has not been taking his medication.  Patient not a good historian; when asked if he was hearing or seeing things that others didn't patient became agitated sat up from a supine position, stood and then started pacing up and down hall.  "You look at the same people that look like you.  I'm doing what I'm doing.  [name] don't have a brick of gold, just a brick."  Patient only becomes agitates with questions.  Patient psychotic with disorganized thoughts and irritability.    Total Time spent with patient: 30 minutes  Psychiatric Specialty Exam: Physical Exam  Constitutional: He appears well-developed and well-nourished. No distress.  Neck: Normal range of motion.  Respiratory: Effort normal.  Musculoskeletal: Normal range of motion.  Neurological: He is alert.  Skin: Skin is warm and dry.  Psychiatric: His mood appears anxious. His affect is labile. His speech is rapid and/or pressured. Agitated: easily irritated. Thought content is paranoid. Cognition and memory are impaired. He expresses impulsivity.    Review of Systems  Unable to perform ROS: Acuity of condition    There were no vitals taken for this visit.There is no height or weight on file to calculate BMI.  General Appearance: Casual  Eye Contact:  Minimal  Speech:  Pressured and When not selectivly mute  Volume:  Normal  Mood:  Irritable  Affect:  Labile  Thought Process:  Disorganized and Irrelevant  Orientation:  Other:  Person and place  Thought Content:  Illogical, Delusions and Hallucinations: Auditory  Suicidal Thoughts:  No  Homicidal Thoughts:  No  Memory:  Immediate;   Poor Recent;   Poor  Judgement:  Impaired  Insight:  Lacking  Psychomotor Activity:  Restlessness  Concentration:  Concentration: Poor and Attention Span: Poor  Recall:  Poor  Fund of Knowledge:Fair  Language: Fair  Akathisia:  No  Handed:  Right  AIMS (if indicated):     Assets:  Desire for Improvement Housing Social Support  Sleep:       Musculoskeletal: Strength & Muscle Tone: within normal limits Gait & Station: normal Patient leans: N/A  There were no vitals taken for this visit.  Recommendations: Inpatient psychiatric treatment needed  Based on my evaluation the patient does not appear to have an emergency medical condition.  Kirsta Probert, NP 04/11/2019, 4:21 PM

## 2019-04-11 NOTE — ED Notes (Signed)
Patient's belongings placed in locker 3.   Pt refused to sign inventory sheet. He placed an X on the signature line.

## 2019-04-11 NOTE — ED Notes (Signed)
Pt aware we need a urine specimen. 

## 2019-04-11 NOTE — ED Notes (Addendum)
Notified AC in regards to the need for a SI sitter per charge.

## 2019-04-11 NOTE — ED Notes (Signed)
Patient refused Covid test EDP notified-Monique,RN

## 2019-04-11 NOTE — ED Provider Notes (Signed)
[facility] EMERGENCY DEPARTMENT Provider Note   CSN: [id number] Arrival date & time: 04/11/19  [place]     History   Chief Complaint Chief Complaint  Patient presents with   Suicidal    IVC    HPI Level 5 caveat due to suspected psychosis Carlos [name] is a 28 y.o. male with history of PTSD, schizophrenia, depression, polysubstance abuse who presents with GPD under IVC from [facility].  Staff reported that he had suicidal ideations.  He has history of psychosis and polysubstance abuse.  Patient is refusing to talk or answer questions to myself or nursing staff.     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 No pertinent surgical history.      Home Medications    Prior to Admission medications   Medication Sig Start Date End Date Taking? Authorizing Provider  benztropine (COGENTIN) 0.5 MG tablet Take 1 tablet (0.5 mg total) by mouth 2 (two) times daily. For drug induced tremors Patient not taking: Reported on 01/19/2019 05/06/16   Lindell Spar I, NP  carbamazepine (TEGRETOL) 200 MG tablet Take 1 tablet (200 mg total) by mouth 2 (two) times daily after a meal. For mood stabilization Patient not taking: Reported on 01/19/2019 05/06/16   Lindell Spar I, NP  hydrOXYzine (ATARAX/VISTARIL) 25 MG tablet Take 1 tablet (25 mg total) by mouth every 6 (six) hours as needed for anxiety. Patient not taking: Reported on 01/19/2019 05/06/16   Lindell Spar I, NP  Melatonin 1 MG TABS Take 2 mg by mouth at bedtime as needed (sleep).     [provider]   prazosin (MINIPRESS) 2 MG capsule Take 1 capsule (2 mg total) by mouth at bedtime. For nightmares Patient not taking: Reported on 01/19/2019 05/06/16   Lindell Spar I, NP  traZODone (DESYREL) 100 MG tablet Take 1 tablet (100 mg total) by mouth at bedtime. For insomnia Patient not taking: Reported on 01/19/2019 05/06/16   Lindell Spar I, NP  ziprasidone (GEODON) 20 MG capsule Take 5 capsules (100 mg total) by mouth daily with supper. For mood control Patient not taking: Reported on 01/19/2019 05/06/16   Lindell Spar I, NP  ziprasidone (GEODON) 80 MG capsule Take 1 capsule (80 mg total) by mouth daily with breakfast. For mood control Patient not taking: Reported on 01/19/2019 05/07/16   Encarnacion Slates, NP    Family History Family History  Problem Relation Age of Onset   Mental illness Brother     Social History Social History   Tobacco Use   Smoking status: Current Every Day Smoker    Packs/day: 3.00    Types: Cigarettes   Smokeless tobacco: Never Used  Substance Use Topics   Alcohol use: No   Drug use: Yes    Types: Cocaine, Marijuana, [name], IV     Allergies   Depakote [divalproex sodium], Invega [paliperidone er], and Other   Review of Systems Review of Systems  Unable to perform ROS: Psychiatric disorder     Physical Exam Updated Vital Signs BP 128/89 (BP  Location: Right Arm)    Pulse (!) 105    Temp 98.5 F (36.9 C) (Oral)    Resp 18    SpO2 98%   Physical Exam Vitals signs and nursing note reviewed.  Constitutional:      General: He is not in acute distress.    Appearance: He is well-developed. He is not diaphoretic.     Comments: Patient lying on the stretcher asleep when I entered the room; patient startled when I said his name  HENT:     Head: Normocephalic and atraumatic.     Mouth/Throat:     Pharynx: No oropharyngeal exudate.  Eyes:     General: No scleral icterus.       Right eye: No discharge.        Left eye: No discharge.      Conjunctiva/sclera: Conjunctivae normal.     Pupils: Pupils are equal, round, and reactive to light.  Neck:     Musculoskeletal: Normal range of motion and neck supple.     Thyroid: No thyromegaly.  Cardiovascular:     Rate and Rhythm: Regular rhythm. Tachycardia present.     Heart sounds: Normal heart sounds. No murmur. No friction rub. No gallop.   Pulmonary:     Effort: Pulmonary effort is normal. No respiratory distress.     Breath sounds: Normal breath sounds. No stridor. No wheezing or rales.  Abdominal:     General: Bowel sounds are normal. There is no distension.     Palpations: Abdomen is soft.     Tenderness: There is no abdominal tenderness. There is no guarding or rebound.  Lymphadenopathy:     Cervical: No cervical adenopathy.  Skin:    General: Skin is warm and dry.     Coloration: Skin is not pale.     Findings: No rash.  Neurological:     Mental Status: He is alert.     Coordination: Coordination normal.  Psychiatric:     Comments: Briefly grunts when I talk to him or ask questions, will not answer or speak      ED Treatments / Results  Labs (all labs ordered are listed, but only abnormal results are displayed) Labs Reviewed  COMPREHENSIVE METABOLIC PANEL - Abnormal; Notable for the following components:      Result Value   CO2 15 (*)    Glucose, Bld 142 (*)    Creatinine, Ser 1.84 (*)    Total Bilirubin 1.4 (*)    GFR calc non Af Amer 48 (*)    GFR calc Af Amer 56 (*)    Anion gap 16 (*)    All other components within normal limits  ACETAMINOPHEN LEVEL - Abnormal; Notable for the following components:   Acetaminophen (Tylenol), Serum <10 (*)    All other components within normal limits  CBC - Abnormal; Notable for the following components:   RBC 6.03 (*)    Hemoglobin 18.1 (*)    HCT 54.7 (*)    All other components within normal limits  CK - Abnormal; Notable for the following components:   Total CK 475 (*)    All other components within normal  limits  COMPREHENSIVE METABOLIC PANEL - Abnormal; Notable for the following components:   CO2 20 (*)    Glucose, Bld 151 (*)    Creatinine, Ser 1.46 (*)    Calcium 8.2 (*)    Total Protein 5.3 (*)    Albumin 3.0 (*)    All other  components within normal limits  SARS CORONAVIRUS 2 (HOSPITAL ORDER, PERFORMED IN [facility])  ETHANOL  SALICYLATE LEVEL  LACTIC ACID, PLASMA  RAPID URINE DRUG SCREEN, HOSP PERFORMED    EKG None  Radiology No results found.  Procedures Procedures (including critical care time)  Medications Ordered in ED Medications  sodium chloride 0.9 % bolus 2,000 mL (0 mLs Intravenous Stopped 04/12/19 0402)     Initial Impression / Assessment and Plan / ED Course  I have reviewed the triage vital signs and the nursing notes.  Pertinent labs & imaging results that were available during my care of the patient were reviewed by me and considered in my medical decision making (see chart for details).        Per IVC paperwork, patient has had suicidal ideations.  Patient will not speak to [place] or give [place] any information.  Patient is found to have an AKI with anion gap 16 and CO2 15.  Suspect dehydration due to elevated hemoglobin as well.  Will order IV fluids and recheck.  Patient not medically cleared at this time, but TTS to assess for disposition.  After IV fluids, CMP was repeated and bicarb has increased to 20 and creatinine has decreased to 1.46.  Anion gap has cleared.  Feel patient can be medically cleared at this time and admitted to behavioral health when a bed is available.  Patient has been IVC'd by behavioral health provider.  Final Clinical Impressions(s) / ED Diagnoses   Final diagnoses:  Medical clearance for psychiatric admission  AKI (acute kidney injury) [facility])  Dehydration    ED Discharge Orders    None       Emi Holes, PA-C 04/12/19 0544    Zadie Rhine, MD 04/12/19 [phone]

## 2019-04-12 ENCOUNTER — Inpatient Hospital Stay (HOSPITAL_COMMUNITY)
Admission: AD | Admit: 2019-04-12 | Discharge: 2019-04-18 | DRG: 885 | Disposition: A | Discharge status: Home / Self Care | Payer: Medicare Other | Source: Intra-hospital | Attending: Psychiatry | Admitting: Psychiatry

## 2019-04-12 DIAGNOSIS — Z9114 Patient's other noncompliance with medication regimen: Secondary | ICD-10-CM | POA: Diagnosis not present

## 2019-04-12 DIAGNOSIS — F4310 Post-traumatic stress disorder, unspecified: Secondary | ICD-10-CM | POA: Diagnosis present

## 2019-04-12 DIAGNOSIS — F17210 Nicotine dependence, cigarettes, uncomplicated: Secondary | ICD-10-CM | POA: Diagnosis present

## 2019-04-12 DIAGNOSIS — Z20828 Contact with and (suspected) exposure to other viral communicable diseases: Secondary | ICD-10-CM | POA: Diagnosis present

## 2019-04-12 DIAGNOSIS — F4010 Social phobia, unspecified: Secondary | ICD-10-CM | POA: Diagnosis present

## 2019-04-12 DIAGNOSIS — F200 Paranoid schizophrenia: Principal | ICD-10-CM | POA: Diagnosis present

## 2019-04-12 DIAGNOSIS — R45851 Suicidal ideations: Secondary | ICD-10-CM | POA: Diagnosis present

## 2019-04-12 DIAGNOSIS — G4700 Insomnia, unspecified: Secondary | ICD-10-CM | POA: Diagnosis present

## 2019-04-12 LAB — COMPREHENSIVE METABOLIC PANEL
ALT: 25 U/L (ref 0–44)
AST: 26 U/L (ref 15–41)
Albumin: 3 g/dL — ABNORMAL LOW (ref 3.5–5.0)
Alkaline Phosphatase: 40 U/L (ref 38–126)
Anion gap: 9 (ref 5–15)
BUN: 13 mg/dL (ref 6–20)
CO2: 20 mmol/L — ABNORMAL LOW (ref 22–32)
Calcium: 8.2 mg/dL — ABNORMAL LOW (ref 8.9–10.3)
Chloride: 109 mmol/L (ref 98–111)
Creatinine, Ser: 1.46 mg/dL — ABNORMAL HIGH (ref 0.61–1.24)
GFR calc Af Amer: >60 mL/min (ref >60)
GFR calc non Af Amer: >60 mL/min (ref >60)
Glucose, Bld: 151 mg/dL — ABNORMAL HIGH (ref 70–99)
Potassium: 3.8 mmol/L (ref 3.5–5.1)
Sodium: 138 mmol/L (ref 135–145)
Total Bilirubin: 0.5 mg/dL (ref 0.3–1.2)
Total Protein: 5.3 g/dL — ABNORMAL LOW (ref 6.5–8.1)

## 2019-04-12 LAB — LACTIC ACID, PLASMA: Lactic Acid, Venous: 0.8 mmol/L (ref 0.5–1.9)

## 2019-04-12 LAB — CK: Total CK: 475 U/L — ABNORMAL HIGH (ref 49–397)

## 2019-04-12 LAB — SARS CORONAVIRUS 2 BY RT PCR (HOSPITAL ORDER, PERFORMED IN ~~LOC~~ HOSPITAL LAB): SARS Coronavirus 2: NEGATIVE

## 2019-04-12 MED ORDER — MAGNESIUM HYDROXIDE 400 MG/5ML PO SUSP
30.0000 mL | Freq: Every day | ORAL | Status: DC | PRN
  Administered 2019-04-13: 30 mL via ORAL
  Filled 2019-04-12: qty 30

## 2019-04-12 MED ORDER — TEMAZEPAM 30 MG PO CAPS
30.0000 mg | ORAL_CAPSULE | Freq: Every day | ORAL | Status: DC
  Administered 2019-04-13 – 2019-04-17 (×5): 30 mg via ORAL
  Filled 2019-04-12 (×6): qty 1

## 2019-04-12 MED ORDER — ONDANSETRON HCL 4 MG PO TABS: 4.0000 mg | ORAL_TABLET | Freq: Three times a day (TID) | ORAL | Status: DC | PRN

## 2019-04-12 MED ORDER — HYDROXYZINE HCL 50 MG PO TABS
50.0000 mg | ORAL_TABLET | Freq: Three times a day (TID) | ORAL | Status: DC | PRN
  Administered 2019-04-12 – 2019-04-17 (×2): 50 mg via ORAL
  Filled 2019-04-12 (×2): qty 1

## 2019-04-12 MED ORDER — HALOPERIDOL 5 MG PO TABS
5.0000 mg | ORAL_TABLET | Freq: Every day | ORAL | Status: DC
  Filled 2019-04-12 (×3): qty 1

## 2019-04-12 MED ORDER — ACETAMINOPHEN 325 MG PO TABS
650.0000 mg | ORAL_TABLET | Freq: Four times a day (QID) | ORAL | Status: DC | PRN
  Administered 2019-04-12 – 2019-04-17 (×6): 650 mg via ORAL
  Filled 2019-04-12 (×2): qty 2
  Filled 2019-04-12: qty 1
  Filled 2019-04-12 (×3): qty 2
  Filled 2019-04-12: qty 1
  Filled 2019-04-12: qty 2

## 2019-04-12 MED ORDER — TRAZODONE HCL 100 MG PO TABS
100.0000 mg | ORAL_TABLET | Freq: Every evening | ORAL | Status: DC | PRN
  Administered 2019-04-12 – 2019-04-14 (×2): 100 mg via ORAL
  Filled 2019-04-12 (×2): qty 1

## 2019-04-12 MED ORDER — HALOPERIDOL 5 MG PO TABS: 5.0000 mg | ORAL_TABLET | Freq: Four times a day (QID) | ORAL | Status: DC | PRN

## 2019-04-12 MED ORDER — ALUM & MAG HYDROXIDE-SIMETH 200-200-20 MG/5ML PO SUSP
30.0000 mL | ORAL | Status: DC | PRN
  Administered 2019-04-15: 11:00:00 30 mL via ORAL
  Filled 2019-04-12: qty 30

## 2019-04-12 MED ORDER — HALOPERIDOL LACTATE 5 MG/ML IJ SOLN: 10.0000 mg | Freq: Four times a day (QID) | INTRAMUSCULAR | Status: DC | PRN

## 2019-04-12 MED ORDER — HALOPERIDOL 5 MG PO TABS
15.0000 mg | ORAL_TABLET | Freq: Every day | ORAL | Status: DC
  Filled 2019-04-12 (×3): qty 3

## 2019-04-12 MED ORDER — BENZTROPINE MESYLATE 1 MG PO TABS
1.0000 mg | ORAL_TABLET | Freq: Two times a day (BID) | ORAL | Status: DC
  Administered 2019-04-13 – 2019-04-18 (×10): 1 mg via ORAL
  Filled 2019-04-12 (×17): qty 1

## 2019-04-12 MED ORDER — ACETAMINOPHEN 325 MG PO TABS: 650.0000 mg | ORAL_TABLET | ORAL | Status: DC | PRN

## 2019-04-12 MED ORDER — NICOTINE 21 MG/24HR TD PT24: 21.0000 mg | MEDICATED_PATCH | Freq: Every day | TRANSDERMAL | Status: DC

## 2019-04-12 NOTE — ED Notes (Signed)
Patient was given a Snack and Drink. A Regular Diet was ordered for Lunch. 

## 2019-04-12 NOTE — Progress Notes (Signed)
This patient has been accepted to [facility].  Accepting provider: Dr.Farah  Bed: 505-1  RN Call for Report: [phone]  This patient is under IVC and will require sheriff's transportation.  Stephanie Acre, LCSW-A Clinical Social Worker

## 2019-04-12 NOTE — ED Notes (Addendum)
PT is agitated this moment, he wants "shower shoes," Also pacing around and saying the shower is not clean  He is encouraged to wait in his room for shower to be cleaned. Pt continues to shout at this staff Also reporting that he does not eat Pork and he has been given port twice today  [employer] called [employer] called to bring another tray

## 2019-04-12 NOTE — ED Notes (Signed)
Patient is asking for his IV to be removed this morning. He states "you are sucking my blood and I hate it."

## 2019-04-12 NOTE — Tx Team (Signed)
Initial Treatment Plan 04/12/2019 6:54 PM Carlos [name] [id number]:[id number]    PATIENT STRESSORS: Medication change or noncompliance Substance abuse   PATIENT STRENGTHS: Average or above average intelligence General fund of knowledge   PATIENT IDENTIFIED PROBLEMS: Polysubstance  Psychosis  Agitiation  "anxiety"               DISCHARGE CRITERIA:  Improved stabilization in mood, thinking, and/or behavior Verbal commitment to aftercare and medication compliance Withdrawal symptoms are absent or subacute and managed without 24-hour nursing intervention  PRELIMINARY DISCHARGE PLAN: Outpatient therapy Return to previous living arrangement Return to previous work or school arrangements  PATIENT/FAMILY INVOLVEMENT: This treatment plan has been presented to and reviewed with the patient, Carlos [name], and/or family member.  The patient and family have been given the opportunity to ask questions and make suggestions.  Harriet Masson, RN 04/12/2019, 6:54 PM

## 2019-04-12 NOTE — ED Notes (Signed)
Report called to [facility] 

## 2019-04-12 NOTE — Progress Notes (Signed)
Patient ID: Carlos [name], male   DOB: [birth year], 28 y.o.   MRN: [id number]   [place] NOVEL CORONAVIRUS (COVID-19) DAILY CHECK-OFF SYMPTOMS - answer yes or no to each - every day NO YES  Have you had a fever in the past 24 hours?  . Fever (Temp > 37.80C / 100F) X   Have you had any of these symptoms in the past 24 hours? . New Cough .  Sore Throat  .  Shortness of Breath .  Difficulty Breathing .  Unexplained Body Aches   X   Have you had any one of these symptoms in the past 24 hours not related to allergies?   . Runny Nose .  Nasal Congestion .  Sneezing   X   If you have had runny nose, nasal congestion, sneezing in the past 24 hours, has it worsened?  X   EXPOSURES - check yes or no X   Have you traveled outside the state in the past 14 days?  X   Have you been in contact with someone with a confirmed diagnosis of COVID-19 or PUI in the past 14 days without wearing appropriate PPE?  X   Have you been living in the same home as a person with confirmed diagnosis of COVID-19 or a PUI (household contact)?    X   Have you been diagnosed with COVID-19?    X              What to do next: Answered NO to all: Answered YES to anything:   Proceed with unit schedule Follow the [facility] Inpatient Flowsheet.

## 2019-04-12 NOTE — ED Provider Notes (Signed)
Patient seen/examined in the Emergency Department in conjunction with Advanced Practice Provider  Patient presents for medical clearance from psychiatry.   Patient has history of schizophrenia.  He is providing very little information Exam : Awake alert, no acute distress.  He will intermittently answer my questions. Plan: Patient is noted to be dehydrated.  He will require 2 L of IV fluid and recheck of BMP prior to psychiatric clearance    Ripley Fraise, MD 04/12/19 0126

## 2019-04-12 NOTE — ED Notes (Signed)
Patient belongings in [place] [address].

## 2019-04-12 NOTE — Progress Notes (Signed)
Patient ID: Carlos [name], male   DOB: [birth year], 28 y.o.   MRN: [id number]  D: Pt alert and oriented during [facility] admission process. Pt denies SI/HI, A/VH, and any pain. Pt refused to sign admission paperwork.  A: Education, support, reassurance, and encouragement provided, q15 minute safety checks initiated. Pt's belongings in locker # 53.  "Carlos [name] is an 28 y.o. male patient presents to [facility] as a walk in stating "My ACTT team said I wasn't taking my medicine and called me psychotic."  Patient states that he has not been taking his medication.  Patient not a good historian; when asked if he was hearing or seeing things that others didn't patient became agitated sat up from a supine position, stood and then started pacing up and down hall.  "You look at the same people that look like you.  I'm doing what I'm doing.  [name] don't have a brick of gold, just a brick."  Patient only becomes agitates with questions.  Patient psychotic with disorganized thoughts and irritability."  R: Pt denies any concerns at this time, and verbally contracts for safety. Pt ambulating on the unit with no issues. Pt remains safe on and off the unit.

## 2019-04-12 NOTE — ED Notes (Signed)
ED Provider at bedside. 

## 2019-04-13 DIAGNOSIS — F200 Paranoid schizophrenia: Principal | ICD-10-CM

## 2019-04-13 MED ORDER — OLANZAPINE 10 MG PO TABS
20.0000 mg | ORAL_TABLET | Freq: Every day | ORAL | Status: DC
  Administered 2019-04-13 – 2019-04-15 (×3): 20 mg via ORAL
  Filled 2019-04-13 (×5): qty 2

## 2019-04-13 MED ORDER — CARBAMAZEPINE 100 MG PO CHEW
100.0000 mg | CHEWABLE_TABLET | Freq: Two times a day (BID) | ORAL | Status: DC
  Administered 2019-04-13 – 2019-04-18 (×10): 100 mg via ORAL
  Filled 2019-04-13 (×15): qty 1

## 2019-04-13 MED ORDER — OLANZAPINE 5 MG PO TABS
5.0000 mg | ORAL_TABLET | ORAL | Status: AC
  Administered 2019-04-13: 08:00:00 5 mg via ORAL
  Filled 2019-04-13: qty 2
  Filled 2019-04-13: qty 1

## 2019-04-13 MED ORDER — LORAZEPAM 1 MG PO TABS: 2.0000 mg | ORAL_TABLET | Freq: Four times a day (QID) | ORAL | Status: DC | PRN

## 2019-04-13 MED ORDER — OLANZAPINE 7.5 MG PO TABS
15.0000 mg | ORAL_TABLET | Freq: Every day | ORAL | Status: DC
  Filled 2019-04-13: qty 2

## 2019-04-13 MED ORDER — PRAZOSIN HCL 1 MG PO CAPS
1.0000 mg | ORAL_CAPSULE | Freq: Every day | ORAL | Status: DC
  Administered 2019-04-13 – 2019-04-17 (×5): 1 mg via ORAL
  Filled 2019-04-13 (×8): qty 1

## 2019-04-13 NOTE — Progress Notes (Signed)
Recreation Therapy Notes  INPATIENT RECREATION THERAPY ASSESSMENT  Patient Details Name: Carlos [name] MRN: [id number] DOB: 18-Apr-1991 Today's Date: 04/13/2019       Information Obtained From: Patient  Able to Participate in Assessment/Interview: Yes  Patient Presentation: Alert  Reason for Admission (Per Patient): Other (Comments)(ACTT team brought him here)  Patient Stressors: Other (Comment)(Low on food; Finances; Helping others)  Coping Skills:   Journal, Sports, Music, Exercise, Meditate, Deep Breathing, Talk, Substance Abuse, Prayer  Leisure Interests (2+):  Sports - Basketball, Individual - Other (Comment), Music - Write music(Cooking; Think of new ideas)  Frequency of Recreation/Participation: Weekly  Awareness of Community Resources:  Yes  Community Resources:  Restaurants, Engineer, building services  Current Use: Yes  If no, Barriers?:    Expressed Interest in [place]: No  County of Residence:  [place]  Patient Main Form of Transportation: Public Transportation(Pt stated sometimes he walks or have someone pick him up)  Patient Strengths:  Leadership; Kind heart  Patient Identified Areas of Improvement:  Keeping distance from others  Patient Goal for Hospitalization:  "stay humble and do what I gotta to so I can go home and do what I want to do"  Current SI (including self-harm):  No  Current HI:  No  Current AVH: No  Staff Intervention Plan: Group Attendance, Collaborate with Interdisciplinary Treatment Team  Consent to Intern Participation: N/A   [name], LRT/CTRS  Ria Comment, Shanitha Twining A 04/13/2019, 1:40 PM

## 2019-04-13 NOTE — [facility] Suicide Risk Assessment (Signed)
[facility] Admission Suicide Risk Assessment   Nursing information obtained from:  Patient Demographic factors:  Male Current Mental Status:  NA Loss Factors:  NA Historical Factors:  Impulsivity Risk Reduction Factors:  Positive therapeutic relationship  Total Time spent with patient: 30 minutes Principal Problem: <principal problem not specified> Diagnosis:  Active Problems:   Paranoid schizophrenia ([place])  Subjective Data: Patient is seen and examined patient is a 28 year old male with a past psychiatric history significant for schizophrenia as well as posttraumatic stress disorder who was originally sent to the behavioral health hospital on 04/11/2019 under involuntary commitment.  At that time his ACTT team told him to come to the hospital because he had been noncompliant with his medications.  The patient stated he had not been noncompliant.  He was argumentative and irritated, and then was sent to the medical hospital for clearance.  Staff had reported that he had suicidal ideation, but at that time was not a great historian.  He was refusing to talk to staff.  He was sent back to our facility after clearance.  He is currently disorganized, admitted to auditory and visual hallucinations, and is clearly paranoid as well.  He did state that he was taking olanzapine per his ACTT team, but we are still waiting on his med rec information.  He also stated he was on a mood stabilizer but was unsure of which 1.  He stated he did smoke marijuana, and wanted to know why his hallucinations worsened after the marijuana.  He was admitted to the hospital for evaluation and stabilization.  Continued Clinical Symptoms:    The "Alcohol Use Disorders Identification Test", Guidelines for Use in Primary Care, Second Edition.  World Pharmacologist [facility]). Score between 0-7:  no or low risk or alcohol related problems. Score between 8-15:  moderate risk of alcohol related problems. Score between 16-19:  high risk  of alcohol related problems. Score 20 or above:  warrants further diagnostic evaluation for alcohol dependence and treatment.   CLINICAL FACTORS:   Depression:   Aggression Anhedonia Comorbid alcohol abuse/dependence Delusional Hopelessness Impulsivity Insomnia Alcohol/Substance Abuse/Dependencies Schizophrenia:   Less than 72 years old Paranoid or undifferentiated type Unstable or Poor Therapeutic Relationship Previous Psychiatric Diagnoses and Treatments   Musculoskeletal: Strength & Muscle Tone: within normal limits Gait & Station: normal Patient leans: N/A  Psychiatric Specialty Exam: Physical Exam  Nursing note and vitals reviewed. Constitutional: He is oriented to person, place, and time. He appears well-developed and well-nourished.  HENT:  Head: Normocephalic and atraumatic.  Respiratory: Effort normal.  Neurological: He is alert and oriented to person, place, and time.    ROS  Blood pressure 109/84, pulse 82, temperature 98.7 F (37.1 C), temperature source Oral, resp. rate 20, height 6' (1.829 m), weight 93 kg, SpO2 97 %.Body mass index is 27.8 kg/m.  General Appearance: Disheveled  Eye Contact:  Minimal  Speech:  Pressured  Volume:  Increased  Mood:  Anxious, Dysphoric and Irritable  Affect:  Labile  Thought Process:  Disorganized and Descriptions of Associations: Loose  Orientation:  Full (Time, Place, and Person)  Thought Content:  Delusions, Hallucinations: Auditory Visual and Paranoid Ideation  Suicidal Thoughts:  Yes.  without intent/plan  Homicidal Thoughts:  No  Memory:  Immediate;   Poor Recent;   Poor Remote;   Poor  Judgement:  Impaired  Insight:  Lacking  Psychomotor Activity:  Increased  Concentration:  Concentration: Fair and Attention Span: Fair  Recall:  Poor  Fund of  Knowledge:  Fair  Language:  Fair  Akathisia:  Negative  Handed:  Right  AIMS (if indicated):     Assets:  Desire for Improvement Resilience  ADL's:  Intact   Cognition:  WNL  Sleep:  Number of Hours: 6.5      COGNITIVE FEATURES THAT CONTRIBUTE TO RISK:  Thought constriction (tunnel vision)    SUICIDE RISK:   Moderate:  Frequent suicidal ideation with limited intensity, and duration, some specificity in terms of plans, no associated intent, good self-control, limited dysphoria/symptomatology, some risk factors present, and identifiable protective factors, including available and accessible social support.  PLAN OF CARE: Patient is seen and examined.  Patient is a 28 year old male with the above-stated past psychiatric history who is admitted secondary to worsening psychotic symptoms as well as possible suicidal ideation.  He will be admitted to the hospital.  He will be integrated into the milieu.  He will be encouraged to attend groups.  A drug screen was not obtained in the emergency room, or at least the results are not available so far.  I have reordered that.  The patient stated he was taking olanzapine and some mood stabilizer, took all of his medicines at night.  We do not have the list of medications from his ACTT service.  I am going to give him 5 mg olanzapine right now, and write for 15 mg p.o. nightly.  I reviewed his old records, and he had previously been on Tegretol, prazosin and Geodon in 2017 on his last psychiatric hospitalization here.  He did state that he had had a hospitalization at old [facility] since then.  He does have a history of PTSD, and some of his delusions are generated by things around his mother.  She he is not taking prazosin as an outpatient right now, and I may re-add that during the course the hospitalization.  I will wait to make any more significant changes in his medicines once we get a confirmed med rec list.  Review of his laboratories revealed his glucose to be elevated at 151, his creatinine to be mildly elevated at 1.46, his CK was mildly elevated at 475.  His hemoglobin and hematocrit are elevated at 18.1 and  54.7.  His platelets are at 346.  He denied any medical problems, and his vital signs are stable and he is afebrile.  Once he was here and receive the Haldol he did sleep well last night.  Per the nursing notes he slept 6.5 hours last night.  I certify that inpatient services furnished can reasonably be expected to improve the patient's condition.   Antonieta PertGreg Lawson Criss Bartles, MD 04/13/2019, 8:02 AM

## 2019-04-13 NOTE — Progress Notes (Signed)
Patient ID: Benito Xavier Turck, male   DOB: 03/20/1991, 28 y.o.   MRN: [id number]   [place] NOVEL CORONAVIRUS (COVID-19) DAILY CHECK-OFF SYMPTOMS - answer yes or no to each - every day NO YES  Have you had a fever in the past 24 hours?  . Fever (Temp > 37.80C / 100F) X   Have you had any of these symptoms in the past 24 hours? . New Cough .  Sore Throat  .  Shortness of Breath .  Difficulty Breathing .  Unexplained Body Aches   X   Have you had any one of these symptoms in the past 24 hours not related to allergies?   . Runny Nose .  Nasal Congestion .  Sneezing   X   If you have had runny nose, nasal congestion, sneezing in the past 24 hours, has it worsened?  X   EXPOSURES - check yes or no X   Have you traveled outside the state in the past 14 days?  X   Have you been in contact with someone with a confirmed diagnosis of COVID-19 or PUI in the past 14 days without wearing appropriate PPE?  X   Have you been living in the same home as a person with confirmed diagnosis of COVID-19 or a PUI (household contact)?    X   Have you been diagnosed with COVID-19?    X              What to do next: Answered NO to all: Answered YES to anything:   Proceed with unit schedule Follow the [facility] Inpatient Flowsheet.   

## 2019-04-13 NOTE — Progress Notes (Signed)
Adult Psychoeducational Group Note  Date:  04/13/2019 Time:  10:39 PM  Group Topic/Focus:  Wrap-Up Group:   The focus of this group is to help patients review their daily goal of treatment and discuss progress on daily workbooks.  Participation Level:  Active  Participation Quality:  Appropriate  Affect:  Appropriate  Cognitive:  Appropriate  Insight: Appropriate  Engagement in Group:  Developing/Improving  Modes of Intervention:  Discussion  Additional Comments: Pt stated his goal for today was to focus on his treatment plan. Pt stated he accomplished his goal today.  Pt stated his relationship with his family has remain the same since he was admitted here. Pt stated he felt better about himself today. Pt rated his overall day a 10. Pt stated his appetite was pretty good today. Pt stated his goal for tonight is to get a good night's rest. Pt stated he had pain in his knee tonight. Pt nurse was made aware of the situation.  Pt stated if anything change he would alert staff.   Candy Sledge 04/13/2019, 10:39 PM

## 2019-04-13 NOTE — Progress Notes (Signed)
Recreation Therapy Notes  Date: 9.11.20 Time: 1000 Location: 500 Hall Dayroom  Group Topic: Triggers  Goal Area(s) Addresses:  Patient will identify what triggers a response from them. Patient will identify how to deal with triggers when they arise. Patient will identify how to avoid dealing with triggers.  Behavioral Response:  Engaged  Intervention:  Worksheet  Activity: Triggers.  Patients were to identify their three biggest triggers.  Patients were to also identify ways to avoid dealing with triggers and how they face triggers head on.  Education: Communication, Discharge Planning  Education Outcome: Acknowledges understanding/In group clarification offered/Needs additional education.   Clinical Observations/Feedback:  Pt identified triggers as family, twin brother and loud sounds/noises.  Pt avoids triggers by doing exercise, playing basketball and making music.  Pt deals with triggers head on by laughter, having joyful moments and music/movies.    [name], LRT/CTRS     [name] A 04/13/2019 12:01 PM

## 2019-04-13 NOTE — Progress Notes (Signed)
Spirituality group facilitated by Simone Curia, MDIv, [place].  Group Description:  Group focused on topic of hope.  Patients participated in facilitated discussion around topic, connecting with one another around experiences and definitions for hope.  Group members engaged with visual explorer photos, reflecting on what hope looks like for them today.  Group engaged in discussion around how their definitions of hope are present today in hospital.   Modalities: Psycho-social ed, Adlerian, Narrative, MI Patient Progress: Arrived to group late.  Actively engaged with facilitator and group around topic.  Spoke of time in [employer] and having to reinvent himself now that he is discharged.   He drifted into some tangential content speaking about his mother, who is deceased, and frustration with people in his family who had harmed her.  Was redirectable to topic and spoke with facilitator about advocacy for himself as courageous and hopeful.

## 2019-04-13 NOTE — Plan of Care (Signed)
  Problem: Nutritional: Goal: Ability to achieve adequate nutritional intake will improve Outcome: Progressing   Problem: Role Relationship: Goal: Ability to communicate needs accurately will improve Outcome: Progressing Goal: Ability to interact with others will improve Outcome: Progressing   

## 2019-04-13 NOTE — H&P (Signed)
Psychiatric Admission Assessment Adult  Patient Identification: Carlos [name] MRN:  [id number] Date of Evaluation:  04/13/2019 Chief Complaint:  schizophrenia Principal Diagnosis: <principal problem not specified> Diagnosis:  Active Problems:   Paranoid schizophrenia (HCC)  History of Present Illness: Patient is seen and examined patient is a 28 year old male with a past psychiatric history significant for schizophrenia as well as posttraumatic stress disorder who was originally sent to the behavioral health hospital on 04/11/2019 under involuntary commitment.  At that time his ACTT team told him to come to the hospital because he had been noncompliant with his medications.  The patient stated he had not been noncompliant.  He was argumentative and irritated, and then was sent to the medical hospital for clearance.  Staff had reported that he had suicidal ideation, but at that time was not a great historian.  He was refusing to talk to staff.  He was sent back to our facility after clearance.  He is currently disorganized, admitted to auditory and visual hallucinations, and is clearly paranoid as well.  He did state that he was taking olanzapine per his ACTT team, but we are still waiting on his med rec information.  He also stated he was on a mood stabilizer but was unsure of which 1.  He stated he did smoke marijuana, and wanted to know why his hallucinations worsened after the marijuana.  He was admitted to the hospital for evaluation and stabilization.  Associated Signs/Symptoms: Depression Symptoms:  depressed mood, anhedonia, insomnia, psychomotor agitation, fatigue, feelings of worthlessness/guilt, difficulty concentrating, suicidal thoughts without plan, anxiety, loss of energy/fatigue, disturbed sleep, (Hypo) Manic Symptoms:  Delusions, Distractibility, Hallucinations, Impulsivity, Irritable Mood, Labiality of Mood, Anxiety Symptoms:  Excessive Worry, Psychotic Symptoms:   Delusions, Hallucinations: Auditory Visual Ideas of Reference, Paranoia, PTSD Symptoms: Had a traumatic exposure:  in the past Total Time spent with patient: 30 minutes  Past Psychiatric History: Patient has had 3 psychiatric admissions at our facility.  His most recent one here was in 2017.  He was diagnosed with schizophrenia and posttraumatic stress disorder at that time.  He mention one other hospitalization at [facility] sometime since the last time he was in the hospital here.  It is unclear when that was.  He is currently followed by ACTT services as well as [facility].  Is the patient at risk to self? Yes.    Has the patient been a risk to self in the past 6 months? Yes.    Has the patient been a risk to self within the distant past? Yes.    Is the patient a risk to others? Yes.    Has the patient been a risk to others in the past 6 months? Yes.    Has the patient been a risk to others within the distant past? No.   Prior Inpatient Therapy:   Prior Outpatient Therapy:    Alcohol Screening: Patient refused Alcohol Screening Tool: Yes Substance Abuse History in the last 12 months:  Yes.   Consequences of Substance Abuse: Medical Consequences:  Clearly has related to his hospitalizations as well as his worsening psychosis. Previous Psychotropic Medications: Yes  Psychological Evaluations: Yes  Past Medical History:  Past Medical History:  Diagnosis Date  . Depression   . Psychotic disorder (HCC)   . PTSD (post-traumatic stress disorder)    No past surgical history on file. Family History:  Family History  Problem Relation Age of Onset  . Mental illness Brother    Family Psychiatric  History: Mother apparently died of HIV disease Brother has schizophrenia. Tobacco Screening:   Social History:  Social History   Substance and Sexual Activity  Alcohol Use No     Social History   Substance and Sexual Activity  Drug Use Yes  . Types: Cocaine, Marijuana,  [name], IV    Additional Social History:                           Allergies:   Allergies  Allergen Reactions  . Depakote [Divalproex Sodium] Swelling    Tongue swells  . Invega [Paliperidone Er] Swelling  . Other Other (See Comments)    Patient stated no injections   Lab Results:  Results for orders placed or performed during the hospital encounter of 04/11/19 (from the past 48 hour(s))  Comprehensive metabolic panel     Status: Abnormal   Collection Time: 04/11/19  7:56 PM  Result Value Ref Range   Sodium 139 135 - 145 mmol/L   Potassium 4.2 3.5 - 5.1 mmol/L   Chloride 108 98 - 111 mmol/L   CO2 15 (L) 22 - 32 mmol/L   Glucose, Bld 142 (H) 70 - 99 mg/dL   BUN 16 6 - 20 mg/dL   Creatinine, Ser 5.401.84 (H) 0.61 - 1.24 mg/dL   Calcium 9.8 8.9 - 98.110.3 mg/dL   Total Protein 7.7 6.5 - 8.1 g/dL   Albumin 4.4 3.5 - 5.0 g/dL   AST 35 15 - 41 U/L   ALT 32 0 - 44 U/L   Alkaline Phosphatase 59 38 - 126 U/L   Total Bilirubin 1.4 (H) 0.3 - 1.2 mg/dL   GFR calc non Af Amer 48 (L) >60 mL/min   GFR calc Af Amer 56 (L) >60 mL/min   Anion gap 16 (H) 5 - 15    Comment: Performed at [facility], [address] [place], [place] [address]  Ethanol     Status: None   Collection Time: 04/11/19  7:56 PM  Result Value Ref Range   Alcohol, Ethyl (B) <10 <10 mg/dL    Comment: (NOTE) Lowest detectable limit for serum alcohol is 10 mg/dL. For medical purposes only. Performed at [facility], [address] [place], [place] [address]   Salicylate level     Status: None   Collection Time: 04/11/19  7:56 PM  Result Value Ref Range   Salicylate Lvl <7.0 2.8 - 30.0 mg/dL    Comment: Performed at [facility], [address] [place], [place] [address]  Acetaminophen level     Status: Abnormal   Collection Time: 04/11/19  7:56 PM  Result Value Ref Range   Acetaminophen (Tylenol), Serum <10 (L) 10 - 30 ug/mL    Comment: (NOTE) Therapeutic concentrations vary  significantly. A range of 10-30 ug/mL  may be an effective concentration for many patients. However, some  are best treated at concentrations outside of this range. Acetaminophen concentrations >150 ug/mL at 4 hours after ingestion  and >50 ug/mL at 12 hours after ingestion are often associated with  toxic reactions. Performed at [facility], [address] [place], [place] [address]   cbc     Status: Abnormal   Collection Time: 04/11/19  7:56 PM  Result Value Ref Range   WBC 9.4 4.0 - 10.5 K/uL   RBC 6.03 (H) 4.22 - 5.81 MIL/uL   Hemoglobin 18.1 (H) 13.0 - 17.0 g/dL  HCT 54.7 (H) 39.0 - 52.0 %   MCV 90.7 80.0 - 100.0 fL   MCH 30.0 26.0 - 34.0 pg   MCHC 33.1 30.0 - 36.0 g/dL   RDW 16.1 09.6 - 04.5 %   Platelets 346 150 - 400 K/uL   nRBC 0.0 0.0 - 0.2 %    Comment: Performed at [facility], [address], [place], [place] [address]  Lactic acid, plasma     Status: None   Collection Time: 04/12/19  1:30 AM  Result Value Ref Range   Lactic Acid, Venous 0.8 0.5 - 1.9 mmol/L    Comment: Performed at [facility], [address] [place], [place] [address]  CK     Status: Abnormal   Collection Time: 04/12/19  1:30 AM  Result Value Ref Range   Total CK 475 (H) 49 - 397 U/L    Comment: Performed at [facility], [address] [place], [place] [address]  Comprehensive metabolic panel     Status: Abnormal   Collection Time: 04/12/19  2:40 AM  Result Value Ref Range   Sodium 138 135 - 145 mmol/L   Potassium 3.8 3.5 - 5.1 mmol/L   Chloride 109 98 - 111 mmol/L   CO2 20 (L) 22 - 32 mmol/L   Glucose, Bld 151 (H) 70 - 99 mg/dL   BUN 13 6 - 20 mg/dL   Creatinine, Ser 2.13 (H) 0.61 - 1.24 mg/dL   Calcium 8.2 (L) 8.9 - 10.3 mg/dL   Total Protein 5.3 (L) 6.5 - 8.1 g/dL   Albumin 3.0 (L) 3.5 - 5.0 g/dL   AST 26 15 - 41 U/L   ALT 25 0 - 44 U/L   Alkaline Phosphatase 40 38 - 126 U/L   Total Bilirubin 0.5 0.3 - 1.2 mg/dL   GFR calc non Af Amer >60 >60 mL/min    GFR calc Af Amer >60 >60 mL/min   Anion gap 9 5 - 15    Comment: Performed at [facility], [address] [place], [place] [address]  SARS Coronavirus 2 [facility] order, Performed in [facility] hospital lab) Nasopharyngeal Nasopharyngeal Swab     Status: None   Collection Time: 04/12/19  8:00 AM   Specimen: Nasopharyngeal Swab  Result Value Ref Range   SARS Coronavirus 2 NEGATIVE NEGATIVE    Comment: (NOTE) If result is NEGATIVE SARS-CoV-2 target nucleic acids are NOT DETECTED. The SARS-CoV-2 RNA is generally detectable in upper and lower  respiratory specimens during the acute phase of infection. The lowest  concentration of SARS-CoV-2 viral copies this assay can detect is 250  copies / mL. A negative result does not preclude SARS-CoV-2 infection  and should not be used as the sole basis for treatment or other  patient management decisions.  A negative result may occur with  improper specimen collection / handling, submission of specimen other  than nasopharyngeal swab, presence of viral mutation(s) within the  areas targeted by this assay, and inadequate number of viral copies  (<250 copies / mL). A negative result must be combined with clinical  observations, patient history, and epidemiological information. If result is POSITIVE SARS-CoV-2 target nucleic acids are DETECTED. The SARS-CoV-2 RNA is generally detectable in upper and lower  respiratory specimens dur ing the acute phase of infection.  Positive  results are indicative of active infection with SARS-CoV-2.  Clinical  correlation with patient history and other diagnostic information is  necessary  to determine patient infection status.  Positive results do  not rule out bacterial infection or co-infection with other viruses. If result is PRESUMPTIVE POSTIVE SARS-CoV-2 nucleic acids MAY BE PRESENT.   A presumptive positive result was obtained on the submitted specimen  and confirmed on repeat testing.  While  2019 novel coronavirus  (SARS-CoV-2) nucleic acids may be present in the submitted sample  additional confirmatory testing may be necessary for epidemiological  and / or clinical management purposes  to differentiate between  SARS-CoV-2 and other Sarbecovirus currently known to infect humans.  If clinically indicated additional testing with an alternate test  methodology [phone]([id number]) is advised. The SARS-CoV-2 RNA is generally  detectable in upper and lower respiratory sp ecimens during the acute  phase of infection. The expected result is Negative. Fact Sheet for Patients:  BoilerBrush.com.cyhttps://www.fda.gov/media/136312/download Fact Sheet for Healthcare Providers: https://pope.com/https://www.fda.gov/media/136313/download This test is not yet approved or cleared by the [place] FDA and has been authorized for detection and/or diagnosis of SARS-CoV-2 by FDA under an Emergency Use Authorization (EUA).  This EUA will remain in effect (meaning this test can be used) for the duration of the COVID-19 declaration under Section 564(b)(1) of the Act, 21 U.S.C. section 360bbb-3(b)(1), unless the authorization is terminated or revoked sooner. Performed at [facility], [address] [place], [place] [address]     Blood Alcohol level:  Lab Results  Component Value Date   ETH <10 04/11/2019   ETH <10 01/19/2019    Metabolic Disorder Labs:  Lab Results  Component Value Date   HGBA1C 5.4 08/29/2015   MPG 108 08/29/2015   MPG 111 06/04/2014   Lab Results  Component Value Date   PROLACTIN 113.1 (H) 08/29/2015   Lab Results  Component Value Date   CHOL 135 05/04/2016   TRIG 57 05/04/2016   HDL 56 05/04/2016   CHOLHDL 2.4 05/04/2016   VLDL 11 05/04/2016   LDLCALC 68 05/04/2016   LDLCALC 106 (H) 08/29/2015    Current Medications: Current Facility-Administered Medications  Medication Dose Route Frequency Provider Last Rate Last Dose  . acetaminophen (TYLENOL) tablet 650 mg  650 mg Oral Q6H PRN  Malvin JohnsFarah, Brian, MD   650 mg at 04/13/19 0825  . alum & mag hydroxide-simeth (MAALOX/MYLANTA) 200-200-20 MG/5ML suspension 30 mL  30 mL Oral Q4H PRN Malvin JohnsFarah, Brian, MD      . benztropine (COGENTIN) tablet 1 mg  1 mg Oral BID Malvin JohnsFarah, Brian, MD   1 mg at 04/13/19 0814  . carbamazepine (TEGRETOL) chewable tablet 100 mg  100 mg Oral BID Antonieta Pertlary,  Lawson, MD      . haloperidol (HALDOL) tablet 5 mg  5 mg Oral Q6H PRN Malvin JohnsFarah, Brian, MD       Or  . haloperidol lactate (HALDOL) injection 10 mg  10 mg Intramuscular Q6H PRN Malvin JohnsFarah, Brian, MD      . hydrOXYzine (ATARAX/VISTARIL) tablet 50 mg  50 mg Oral TID PRN Malvin JohnsFarah, Brian, MD   50 mg at 04/12/19 1804  . LORazepam (ATIVAN) tablet 2 mg  2 mg Oral Q6H PRN Antonieta Pertlary,  Lawson, MD      . magnesium hydroxide (MILK OF MAGNESIA) suspension 30 mL  30 mL Oral Daily PRN Malvin JohnsFarah, Brian, MD      . OLANZapine [facility](ZYPREXA) tablet 20 mg  20 mg Oral QHS Antonieta Pertlary,  Lawson, MD      . prazosin (MINIPRESS) capsule 1 mg  1 mg Oral QHS Antonieta Pertlary,  Lawson, MD      .  temazepam (RESTORIL) capsule 30 mg  30 mg Oral QHS Malvin Johns, MD      . traZODone (DESYREL) tablet 100 mg  100 mg Oral QHS PRN Malvin Johns, MD   100 mg at 04/12/19 2203   PTA Medications: Medications Prior to Admission  Medication Sig Dispense Refill Last Dose  . Melatonin 3 MG TABS Take 3 mg by mouth at bedtime.   Past Month at Unknown time  . OLANZapine (ZYPREXA) 15 MG tablet Take 30 mg by mouth at bedtime.   Past Month at Unknown time    Musculoskeletal: Strength & Muscle Tone: within normal limits Gait & Station: normal Patient leans: N/A  Psychiatric Specialty Exam: Physical Exam  Nursing note and vitals reviewed. Constitutional: He is oriented to person, place, and time. He appears well-developed and well-nourished.  HENT:  Head: Normocephalic and atraumatic.  Respiratory: Effort normal.  Neurological: He is alert and oriented to person, place, and time.    ROS  Blood pressure 109/84, pulse 82,  temperature 98.7 F (37.1 C), temperature source Oral, resp. rate 20, height 6' (1.829 m), weight 93 kg, SpO2 97 %.Body mass index is 27.8 kg/m.  General Appearance: Disheveled  Eye Contact:  Minimal  Speech:  Normal Rate  Volume:  Increased  Mood:  Anxious, Dysphoric and Irritable  Affect:  Labile  Thought Process:  Disorganized and Descriptions of Associations: Loose  Orientation:  Full (Time, Place, and Person)  Thought Content:  Delusions, Hallucinations: Auditory Visual, Ideas of Reference:   Paranoia Delusions and Paranoid Ideation  Suicidal Thoughts:  Yes.  without intent/plan  Homicidal Thoughts:  No  Memory:  Immediate;   Fair Recent;   Fair Remote;   Fair  Judgement:  Impaired  Insight:  Lacking  Psychomotor Activity:  Increased  Concentration:  Concentration: Fair and Attention Span: Fair  Recall:  [employer] of Knowledge:  Fair  Language:  Fair  Akathisia:  Negative  Handed:  Right  AIMS (if indicated):     Assets:  Desire for Improvement Resilience  ADL's:  Intact  Cognition:  WNL  Sleep:  Number of Hours: 6.5    Treatment Plan Summary: Daily contact with patient to assess and evaluate symptoms and progress in treatment, Medication management and Plan : Patient is seen and examined.  Patient is a 28 year old male with the above-stated past psychiatric history who is admitted secondary to worsening psychotic symptoms as well as possible suicidal ideation.  He will be admitted to the hospital.  He will be integrated into the milieu.  He will be encouraged to attend groups.  A drug screen was not obtained in the emergency room, or at least the results are not available so far.  I have reordered that.  The patient stated he was taking olanzapine and some mood stabilizer, took all of his medicines at night.  We do not have the list of medications from his ACTT service.  I am going to give him 5 mg olanzapine right now, and write for 15 mg p.o. nightly.  I reviewed his old  records, and he had previously been on Tegretol, prazosin and Geodon in 2017 on his last psychiatric hospitalization here.  He did state that he had had a hospitalization at [facility] since then.  He does have a history of PTSD, and some of his delusions are generated by things around his mother.  She he is not taking prazosin as an outpatient right now, and I may re-add that during  the course the hospitalization.  I will wait to make any more significant changes in his medicines once we get a confirmed med rec list.  Review of his laboratories revealed his glucose to be elevated at 151, his creatinine to be mildly elevated at 1.46, his CK was mildly elevated at 475.  His hemoglobin and hematocrit are elevated at 18.1 and 54.7.  His platelets are at 346.  He denied any medical problems, and his vital signs are stable and he is afebrile.  Once he was here and receive the Haldol he did sleep well last night.  Per the nursing notes he slept 6.5 hours last night.  Observation Level/Precautions:  15 minute checks  Laboratory:  Chemistry Profile  Psychotherapy:    Medications:    Consultations:    Discharge Concerns:    Estimated LOS:  Other:     Physician Treatment Plan for Primary Diagnosis: <principal problem not specified> Long Term Goal(s): Improvement in symptoms so as ready for discharge  Short Term Goals: Ability to identify changes in lifestyle to reduce recurrence of condition will improve, Ability to verbalize feelings will improve, Ability to disclose and discuss suicidal ideas, Ability to demonstrate self-control will improve, Ability to identify and develop effective coping behaviors will improve, Ability to maintain clinical measurements within normal limits will improve, Compliance with prescribed medications will improve and Ability to identify triggers associated with substance abuse/mental health issues will improve  Physician Treatment Plan for Secondary Diagnosis: Active Problems:    Paranoid schizophrenia ([place])  Long Term Goal(s): Improvement in symptoms so as ready for discharge  Short Term Goals: Ability to identify changes in lifestyle to reduce recurrence of condition will improve, Ability to verbalize feelings will improve, Ability to disclose and discuss suicidal ideas, Ability to demonstrate self-control will improve, Ability to identify and develop effective coping behaviors will improve, Ability to maintain clinical measurements within normal limits will improve, Compliance with prescribed medications will improve and Ability to identify triggers associated with substance abuse/mental health issues will improve  I certify that inpatient services furnished can reasonably be expected to improve the patient's condition.    Sharma Covert, MD 9/11/20201:47 PM
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
## 2019-04-13 NOTE — Tx Team (Signed)
Interdisciplinary Treatment and Diagnostic Plan Update  04/13/2019 Time of Session: 0[address] MRN: [id number]  Principal Diagnosis: <principal problem not specified>  Secondary Diagnoses: Active Problems:   Paranoid schizophrenia ([place])   Current Medications:  Current Facility-Administered Medications  Medication Dose Route Frequency Provider Last Rate Last Dose  . acetaminophen (TYLENOL) tablet 650 mg  650 mg Oral Q6H PRN Johnn Hai, MD   650 mg at 04/13/19 0825  . alum & mag hydroxide-simeth (MAALOX/MYLANTA) 200-200-20 MG/5ML suspension 30 mL  30 mL Oral Q4H PRN Johnn Hai, MD      . benztropine (COGENTIN) tablet 1 mg  1 mg Oral BID Johnn Hai, MD   1 mg at 04/13/19 0814  . carbamazepine (TEGRETOL) chewable tablet 100 mg  100 mg Oral BID Sharma Covert, MD      . haloperidol (HALDOL) tablet 5 mg  5 mg Oral Q6H PRN Johnn Hai, MD       Or  . haloperidol lactate (HALDOL) injection 10 mg  10 mg Intramuscular Q6H PRN Johnn Hai, MD      . hydrOXYzine (ATARAX/VISTARIL) tablet 50 mg  50 mg Oral TID PRN Johnn Hai, MD   50 mg at 04/12/19 1804  . LORazepam (ATIVAN) tablet 2 mg  2 mg Oral Q6H PRN Sharma Covert, MD      . magnesium hydroxide (MILK OF MAGNESIA) suspension 30 mL  30 mL Oral Daily PRN Johnn Hai, MD      . OLANZapine [facility]) tablet 20 mg  20 mg Oral QHS Sharma Covert, MD      . prazosin (MINIPRESS) capsule 1 mg  1 mg Oral QHS Sharma Covert, MD      . temazepam (RESTORIL) capsule 30 mg  30 mg Oral QHS Johnn Hai, MD      . traZODone (DESYREL) tablet 100 mg  100 mg Oral QHS PRN Johnn Hai, MD   100 mg at 04/12/19 2203   PTA Medications: Medications Prior to Admission  Medication Sig Dispense Refill Last Dose  . Melatonin 3 MG TABS Take 3 mg by mouth at bedtime.   Past Month at Unknown time  . OLANZapine (ZYPREXA) 15 MG tablet Take 30 mg by mouth at bedtime.   Past Month at Unknown time    Patient Stressors: Medication  change or noncompliance Substance abuse  Patient Strengths: Average or above average intelligence General fund of knowledge  Treatment Modalities: Medication Management, Group therapy, Case management,  1 to 1 session with clinician, Psychoeducation, Recreational therapy.   Physician Treatment Plan for Primary Diagnosis: <principal problem not specified> Long Term Goal(s):     Short Term Goals:    Medication Management: Evaluate patient's response, side effects, and tolerance of medication regimen.  Therapeutic Interventions: 1 to 1 sessions, Unit Group sessions and Medication administration.  Evaluation of Outcomes: Not Met  Physician Treatment Plan for Secondary Diagnosis: Active Problems:   Paranoid schizophrenia ([place])  Long Term Goal(s):     Short Term Goals:       Medication Management: Evaluate patient's response, side effects, and tolerance of medication regimen.  Therapeutic Interventions: 1 to 1 sessions, Unit Group sessions and Medication administration.  Evaluation of Outcomes: Not Met   RN Treatment Plan for Primary Diagnosis: <principal problem not specified> Long Term Goal(s): Knowledge of disease and therapeutic regimen to maintain health will improve  Short Term Goals: Ability to identify and develop effective coping behaviors will improve and Compliance with prescribed medications will improve  Medication Management: RN will administer medications as ordered by provider, will assess and evaluate patient's response and provide education to patient for prescribed medication. RN will report any adverse and/or side effects to prescribing provider.  Therapeutic Interventions: 1 on 1 counseling sessions, Psychoeducation, Medication administration, Evaluate responses to treatment, Monitor vital signs and CBGs as ordered, Perform/monitor CIWA, COWS, AIMS and Fall Risk screenings as ordered, Perform wound care treatments as ordered.  Evaluation of Outcomes: Not  Met   LCSW Treatment Plan for Primary Diagnosis: <principal problem not specified> Long Term Goal(s): Safe transition to appropriate next level of care at discharge, Engage patient in therapeutic group addressing interpersonal concerns.  Short Term Goals: Engage patient in aftercare planning with referrals and resources, Increase social support and Increase skills for wellness and recovery  Therapeutic Interventions: Assess for all discharge needs, 1 to 1 time with Social worker, Explore available resources and support systems, Assess for adequacy in community support network, Educate family and significant other(s) on suicide prevention, Complete Psychosocial Assessment, Interpersonal group therapy.  Evaluation of Outcomes: Not Met   Progress in Treatment: Attending groups: No. Participating in groups: No. Taking medication as prescribed: No. Toleration medication: No. Family/Significant other contact made: No, will contact:  when given permission Patient understands diagnosis: No. Discussing patient identified problems/goals with staff: Yes. Medical problems stabilized or resolved: Yes. Denies suicidal/homicidal ideation: Yes. Issues/concerns per patient self-inventory: No. Other: none  New problem(s) identified: No, Describe:  none  New Short Term/Long Term Goal(s):  Patient Goals:    Discharge Plan or Barriers:   Reason for Continuation of Hospitalization: Hallucinations Medication stabilization  Estimated Length of Stay:3-5 days  Attendees: Patient: 04/13/2019   Physician: Dr. Mallie Darting, MD 04/13/2019   Nursing: Elesa Massed, RN 04/13/2019   RN Care Manager: 04/13/2019   Social Worker: Lurline Idol, LCSW 04/13/2019   Recreational Therapist:  04/13/2019   Other:  04/13/2019   Other:  04/13/2019   Other: 04/13/2019        Scribe for Treatment Team: Joanne Chars, LCSW 04/13/2019 10:20 AM

## 2019-04-13 NOTE — Progress Notes (Addendum)
D: Patient denies SI, HI or AVH.  Pt. Presents as flat and cautious stating he was not taking his haldol or restoril d/t hx of allergic reactions.  Pt. Spent the majority of the evening in his room sleeping.  Pt. Denies any physical complaints.  A: Patient given emotional support from RN. Patient encouraged to come to staff with concerns and/or questions. Patient's medication routine continued. Patient's orders and plan of care reviewed.   R: Patient remains appropriate and cooperative. Will continue to monitor patient q15 minutes for safety.

## 2019-04-14 NOTE — Progress Notes (Signed)
Patient has been up in the dayroom interacting appropriately with peers. Writer spoke with him 1:1 and he reports having had a good day. He was informed of his medications. He did ask for the uses of his medications ordered. He was pleasant and cooperative. Support given and safety maintained with 15 min checks.

## 2019-04-14 NOTE — Progress Notes (Addendum)
[facility] MD Progress Note  04/14/2019 1:46 PM Carlos [name]  MRN:  [id number] Subjective:  "my ACTT sent me here" Principal Problem: Paranoid schizophrenia (HCC) Diagnosis: Principal Problem:   Paranoid schizophrenia (HCC)   Per MDs H&P assessment: Patient is a 28 year old male with a past psychiatric history significant for schizophrenia as well as posttraumatic stress disorder who was originally sent to the behavioral health hospital on 04/11/2019 under involuntary commitment. At that time his ACTT team told him to come to the hospital because he had been noncompliant with his medications. The patient stated he had not been noncompliant. He was argumentative and irritated, and then was sent to the medical hospital for clearance. Staff had reported that he had suicidal ideation, but at that time was not a great historian. He was refusing to talk to staff. He was sent back to our facility after clearance. He is currently disorganized, admitted to auditory and visual hallucinations, and is clearly paranoid as well. He did state that he was taking olanzapine per his ACTTteam, but we are still waiting on his med rec information. He also stated he was on a mood stabilizer but was unsure of which 1. He stated he did smoke marijuana, and wanted to know why his hallucinations worsened after the marijuana. He was admitted to the hospital for evaluation and stabilization.  Sept 12, 2020: On assessment today, Carlos [name] is sitting in the common area by the telephone.  He is alert/oriented x3; Presents guarded when greeted by this Clinical research associate but is  willing to answer questions. When asked how h he is doing today he responds, "I'm blessed."  During our encounter, he demonstrates full range of emotion within a 5-10 minute period, and his mood is congruent. Patient is speaking in a clear tone with normal volume, and normal pace; but makes minimal eye contact and mentions irrelevant details.  He  denies sleeping or appetite concerns.  He is compliant with his medications and denies GI concerns.  He denies suicidal or homicidal ideations.  Although he denies audible or visual hallucinations he appears to be responding to internal stimuli.    Patient has remained calm throughout assessment and has answered questions.    Total Time spent with patient: 25 minutes  Past Psychiatric History: Paranoid Schizophrenzia  Past Medical History:  Past Medical History:  Diagnosis Date  . Depression   . Psychotic disorder (HCC)   . PTSD (post-traumatic stress disorder)    No past surgical history on file. Family History:  Family History  Problem Relation Age of Onset  . Mental illness Brother    Family Psychiatric  History: See H&P note   Social History:  Social History   Substance and Sexual Activity  Alcohol Use No     Social History   Substance and Sexual Activity  Drug Use Yes  . Types: Cocaine, Marijuana, [name], IV    Social History   Socioeconomic History  . Marital status: Single    Spouse name: Not on file  . Number of children: Not on file  . Years of education: Not on file  . Highest education level: Not on file  Occupational History  . Not on file  Social Needs  . Financial resource strain: Not on file  . Food insecurity    Worry: Not on file    Inability: Not on file  . Transportation needs    Medical: Not on file    Non-medical: Not on file  Tobacco Use  .  Smoking status: Current Every Day Smoker    Packs/day: 3.00    Types: Cigarettes  . Smokeless tobacco: Never Used  Substance and Sexual Activity  . Alcohol use: No  . Drug use: Yes    Types: Cocaine, Marijuana, [name], IV  . Sexual activity: Yes    Birth control/protection: Condom  Lifestyle  . Physical activity    Days per week: Not on file    Minutes per session: Not on file  . Stress: Not on file  Relationships  . Social Herbalist on phone: Not on file     Gets together: Not on file    Attends religious service: Not on file    Active member of club or organization: Not on file    Attends meetings of clubs or organizations: Not on file    Relationship status: Not on file  Other Topics Concern  . Not on file  Social History Narrative  . Not on file   Additional Social History:                         Sleep: Good  Appetite:  Good  Current Medications: Current Facility-Administered Medications  Medication Dose Route Frequency Provider Last Rate Last Dose  . acetaminophen (TYLENOL) tablet 650 mg  650 mg Oral Q6H PRN Johnn Hai, MD   650 mg at 04/13/19 0825  . alum & mag hydroxide-simeth (MAALOX/MYLANTA) 200-200-20 MG/5ML suspension 30 mL  30 mL Oral Q4H PRN Johnn Hai, MD      . benztropine (COGENTIN) tablet 1 mg  1 mg Oral BID Johnn Hai, MD   1 mg at 04/14/19 0856  . carbamazepine (TEGRETOL) chewable tablet 100 mg  100 mg Oral BID Sharma Covert, MD   100 mg at 04/14/19 0856  . haloperidol (HALDOL) tablet 5 mg  5 mg Oral Q6H PRN Johnn Hai, MD       Or  . haloperidol lactate (HALDOL) injection 10 mg  10 mg Intramuscular Q6H PRN Johnn Hai, MD      . hydrOXYzine (ATARAX/VISTARIL) tablet 50 mg  50 mg Oral TID PRN Johnn Hai, MD   50 mg at 04/12/19 1804  . LORazepam (ATIVAN) tablet 2 mg  2 mg Oral Q6H PRN Sharma Covert, MD      . magnesium hydroxide (MILK OF MAGNESIA) suspension 30 mL  30 mL Oral Daily PRN Johnn Hai, MD   30 mL at 04/13/19 2114  . OLANZapine (ZYPREXA) tablet 20 mg  20 mg Oral QHS Sharma Covert, MD   20 mg at 04/13/19 2113  . prazosin (MINIPRESS) capsule 1 mg  1 mg Oral QHS Sharma Covert, MD   1 mg at 04/13/19 2113  . temazepam (RESTORIL) capsule 30 mg  30 mg Oral QHS Johnn Hai, MD   30 mg at 04/13/19 2113  . traZODone (DESYREL) tablet 100 mg  100 mg Oral QHS PRN Johnn Hai, MD   100 mg at 04/12/19 2203    Lab Results: No results found for this or any previous visit (from  the past 48 hour(s)).  Blood Alcohol level:  Lab Results  Component Value Date   ETH <10 04/11/2019   ETH <10 11/91/4782    Metabolic Disorder Labs: Lab Results  Component Value Date   HGBA1C 5.4 08/29/2015   MPG 108 08/29/2015   MPG 111 06/04/2014   Lab Results  Component Value Date   PROLACTIN 113.1 (H)  08/29/2015   Lab Results  Component Value Date   CHOL 135 05/04/2016   TRIG 57 05/04/2016   HDL 56 05/04/2016   CHOLHDL 2.4 05/04/2016   VLDL 11 05/04/2016   LDLCALC 68 05/04/2016   LDLCALC 106 (H) 08/29/2015    Physical Findings: AIMS:  , ,  ,  ,    CIWA:  CIWA-Ar Total: 0 COWS:     Musculoskeletal: Strength & Muscle Tone: within normal limits Gait & Station: normal Patient leans: N/A  Psychiatric Specialty Exam: Physical Exam  Constitutional: He is oriented to person, place, and time. He appears well-developed.  Neck: Normal range of motion.  Musculoskeletal: Normal range of motion.  Neurological: He is alert and oriented to person, place, and time.    Review of Systems  Constitutional: Negative for chills and fever.  Psychiatric/Behavioral: Negative for hallucinations (Hx of AVH although he denies today) and substance abuse (Hx: THC ). The patient is nervous/anxious (being treated during this hospital stay) and has insomnia (appears stable with tx).     Blood pressure 101/75, pulse (!) 58, temperature (!) 97.4 F (36.3 C), temperature source Oral, resp. rate 20, height 6' (1.829 m), weight 93 kg, SpO2 97 %.Body mass index is 27.8 kg/m.   General Appearance: Casual  Eye Contact:  Minimal  Speech:  Normal Rate  Volume:  Normal  Mood:  Anxious, Dysphoric and Irritable  Affect:  Labile  Thought Process:  Disorganized and Descriptions of Associations: Loose  Orientation:  Full (Time, Place, and Person)  Thought Content:  Delusions, Paranoia Delusions and Paranoid Ideation  Suicidal Thoughts:  Yes.  without intent/plan  Homicidal Thoughts:  No   Memory:  Immediate;   Fair Recent;   Fair Remote;   Fair  Judgement:  Impaired  Insight:  Lacking  Psychomotor Activity:  Increased  Concentration:  Concentration: Fair and Attention Span: Fair  Recall:  F[employer]Fair  Fund of Knowledge:  Fair  Language:  Fair  Akathisia:  Negative  Handed:  Right  AIMS (if indicated):     Assets:  Desire for Improvement Resilience  ADL's:  Intact  Cognition:  WNL  Sleep:  Number of Hours: 6.5   Treatment Plan Summary: Daily contact with patient to assess and evaluate symptoms and progress in treatment and Medication management.  Continue inpatient hopsitalization  Schizophrenia: Continue Zyprexa 20 mg po qhs  EPS: Continue Cogenetin 1 mg po twice daily  Mood Stabilization: Continue Tegretol 100 mg po twice daily  Anxiety: Continue Vistaril 50 mg po three times daily, prn  Lorazepam 2 mg po q6h, prn  PTSD: Continue prazosin po 1mg  qhs  Insomnia: Continue temazepam 30mg  po qhs Trazodone 100mg  po qhs, prn  Agitation/Psychosis: Continue Haldol 5mg  po qhs, prn Or Continue haldol 10mg  IM qh6, prn  Patient to attend and particiapte in the group milieu.  Discharge disposition is ongoing.  Chales AbrahamsShnese E Mills, NP 04/14/2019, 1:46 PM   Attest to NP progress note

## 2019-04-14 NOTE — Progress Notes (Signed)
DAR NOTE: Pt present with flat bright  and pleasant mood in the unit. Pt has been visible in the milieu with peers. Pt stated he had a good day, complained of indigestion. Pt denies physical pain, took all his meds as scheduled. Pt's safety ensured with 15 minute and environmental checks. Pt currently denies SI/HI and A/V hallucinations. Pt verbally agrees to seek staff if SI/HI or A/VH occurs and to consult with staff before acting on these thoughts. Will continue POC.

## 2019-04-14 NOTE — Progress Notes (Signed)
Adult Psychoeducational Group Note  Date:  04/14/2019 Time:  11:33 PM  Group Topic/Focus:  Wrap-Up Group:   The focus of this group is to help patients review their daily goal of treatment and discuss progress on daily workbooks.  Participation Level:  Active  Participation Quality:  Appropriate  Affect:  Appropriate  Cognitive:  Appropriate  Insight: Appropriate  Engagement in Group:  Developing/Improving  Modes of Intervention:  Discussion  Additional Comments:  Pt stated his goal for today was to focus on his treatment plan. Pt stated he accomplished his goal today.  Pt stated his relationship with his family has improved since he was admitted here. Pt stated he felt the conversation with his mother today help improve his day. Pt stated he felt better about himself today. Pt rated his overall day an 9 out of 10. Pt stated his appetite was pretty good today. Pt stated his goal for tonight is to get a good night's rest. Pt stated he had pain in his right knee tonight. Pt nurse was made aware of the situation.  Pt stated if anything change he would alert staff.  Candy Sledge 04/14/2019, 11:33 PM

## 2019-04-14 NOTE — [facility] Group Notes (Signed)
  [facility] LCSW Group Therapy Note  Date/Time:  04/14/2019 11:15AM-12:00PM  Type of Therapy and Topic:  Group Therapy:  Feelings About Hospitalization  Participation Level:  Minimal   Description of Group This process group involved patients discussing their feelings related to being hospitalized, as well as the benefits they see to being in the hospital.  These feelings and benefits were itemized.  The group then brainstormed specific ways in which they could seek those same benefits when they discharge and return home.  Therapeutic Goals 1. Patient will identify and describe positive and negative feelings related to hospitalization 2. Patient will verbalize benefits of hospitalization to themselves personally 3. Patients will brainstorm together ways they can obtain similar benefits in the outpatient setting, identify barriers to wellness and possible solutions  Summary of Patient Progress:  The patient expressed his primary feelings about being hospitalized are not positive.  He was given positive strokes for his honesty.  He started having side conversations with another patient about the chess board in front of him, but was pleasant when redirected.  Therapeutic Modalities Cognitive Behavioral Therapy Motivational Interviewing    Selmer Dominion, LCSW 04/14/2019, 12:38 PM

## 2019-04-15 NOTE — Progress Notes (Signed)
Nurse discussed anxiety, depression and coping skills with patient.  

## 2019-04-15 NOTE — Progress Notes (Signed)
Patient has been up and active on the unit, attended group this evening and has voiced no complaints. He requested the number to the temp agency so that he could call in tomorrow. Patient currently denies having pain, -si/hi/a/v hall. Support and encouragement offered, safety maintained on unit, will continue to monitor.

## 2019-04-15 NOTE — Progress Notes (Signed)
D:  Patient's self inventory sheet, patient sleeps good, no sleep medication.  Good appetite, normal energy level, good concentration. Denied anxiety, depression and coping skills with patient.  Denied withdrawals.  Denied SI.  Denied physical problems.  Physical pain, #7 knee.  Pain medicine helpful.  Goal is keep doing what he has been doing and stay happy.  Does have discharge plans. A:  Medications administered per MD order.  Emotional support and encouragement given patient. R:  Denied SI and HI, contracts for safety.  Denied A/V hallucinations.  Safety maintained with 15 minute check.

## 2019-04-15 NOTE — Progress Notes (Signed)
Adult Psychoeducational Group Note  Date:  04/15/2019 Time:  10:53 PM  Group Topic/Focus:  Wrap-Up Group:   The focus of this group is to help patients review their daily goal of treatment and discuss progress on daily workbooks.  Participation Level:  Active  Participation Quality:  Appropriate  Affect:  Appropriate  Cognitive:  Appropriate  Insight: Appropriate  Engagement in Group:  Developing/Improving  Modes of Intervention:  Discussion  Additional Comments:  Pt stated his goal for today was to focus on his treatment plan. Pt stated he accomplished his goal today.  Pt stated his relationship with his family has improved since he was admitted here. Pt stated he had a positive conversation with his brother that help improve his day. Pt stated he felt better about himself today. Pt rated his overall day a 10. Pt stated his appetite was pretty good today. Pt stated his goal for tonight is to get a good night's rest. Pt stated he had pain in his right knee tonight. Pt nurse was made aware of the situation.  Pt stated if anything change he would alert staff.  Candy Sledge 04/15/2019, 10:53 PM

## 2019-04-15 NOTE — Progress Notes (Addendum)
[facility]  04/15/2019 10:50 AM Carlos FlakesDerrick Xavier [name]  MRN:  [id number] Subjective:  I'm doing good today, now that I am taking medication"  Objective: On encounter today, Carlos [name] is in the hallway talking with a peer.  He responds with appropriate salutation when greeted by this Clinical research associatewriter and agrees to talk with me.  He reports doing well today, rates depression and anxiety 0/10 (where 0/10 is no symptoms and 10/10 is severe).  States he is not here for depressive symptoms, rather, "I'm here because I was seeing things and hearing voices of people that weren't there."  States he previously heard his deceased mother's voice and remarks to this Clinical research associatewriter, "that ain't right."  Also relates some of his symptoms to prior [employer] and Companymilitary exposure.  He is more talkative and does not appear irritable today.  Also verbalizes insight into his mental health concerns and how he can work with his ACTT to manage when discharged.  He denies suicidal or homicidal ideations and denies audible or visual hallucinations.  Although he endorses AVH upon admission, he states this has improved since taking his medications.    Per collateral information received from nursing notes overnight, the patient is cooperative with staff; appropriately engages with peers and voluntarily takes medications.    He denies sleep or appetite concerns.  He is compliant with medications and denies GI concerns.    Principal Problem: Paranoid schizophrenia (HCC) Diagnosis: Principal Problem:   Paranoid schizophrenia (HCC)  Total Time spent with patient: 15 minutes  Past Psychiatric History: Depression, Psychotic Disorder, PTSD  Past Medical History:  Past Medical History:  Diagnosis Date  . Depression   . Psychotic disorder (HCC)   . PTSD (post-traumatic stress disorder)    No past surgical history on file. Family History:  Family History  Problem Relation Age of Onset  . Mental illness Brother    Family Psychiatric  History:  See H&P Social History:  Social History   Substance and Sexual Activity  Alcohol Use No     Social History   Substance and Sexual Activity  Drug Use Yes  . Types: Cocaine, Marijuana, [name], IV    Social History   Socioeconomic History  . Marital status: Single    Spouse name: Not on file  . Number of children: Not on file  . Years of education: Not on file  . Highest education level: Not on file  Occupational History  . Not on file  Social Needs  . Financial resource strain: Not on file  . Food insecurity    Worry: Not on file    Inability: Not on file  . Transportation needs    Medical: Not on file    Non-medical: Not on file  Tobacco Use  . Smoking status: Current Every Day Smoker    Packs/day: 3.00    Types: Cigarettes  . Smokeless tobacco: Never Used  Substance and Sexual Activity  . Alcohol use: No  . Drug use: Yes    Types: Cocaine, Marijuana, [name], IV  . Sexual activity: Yes    Birth control/protection: Condom  Lifestyle  . Physical activity    Days per week: Not on file    Minutes per session: Not on file  . Stress: Not on file  Relationships  . Social Musicianconnections    Talks on phone: Not on file    Gets together: Not on file    Attends religious service: Not on file    Active member of club  or organization: Not on file    Attends meetings of clubs or organizations: Not on file    Relationship status: Not on file  Other Topics Concern  . Not on file  Social History Narrative  . Not on file   Additional Social History:                         Sleep: Good  Appetite:  Good  Current Medications: Current Facility-Administered Medications  Medication Dose Route Frequency Provider Last Rate Last Dose  . acetaminophen (TYLENOL) tablet 650 mg  650 mg Oral Q6H PRN Malvin Johns, MD   650 mg at 04/15/19 0835  . alum & mag hydroxide-simeth (MAALOX/MYLANTA) 200-200-20 MG/5ML suspension 30 mL  30 mL Oral Q4H PRN Malvin Johns, MD      . benztropine (COGENTIN) tablet 1 mg  1 mg Oral BID Malvin Johns, MD   1 mg at 04/15/19 6063  . carbamazepine (TEGRETOL) chewable tablet 100 mg  100 mg Oral BID Antonieta Pert, MD   100 mg at 04/15/19 0160  . haloperidol (HALDOL) tablet 5 mg  5 mg Oral Q6H PRN Malvin Johns, MD       Or  . haloperidol lactate (HALDOL) injection 10 mg  10 mg Intramuscular Q6H PRN Malvin Johns, MD      . hydrOXYzine (ATARAX/VISTARIL) tablet 50 mg  50 mg Oral TID PRN Malvin Johns, MD   50 mg at 04/12/19 1804  . LORazepam (ATIVAN) tablet 2 mg  2 mg Oral Q6H PRN Antonieta Pert, MD      . magnesium hydroxide (MILK OF MAGNESIA) suspension 30 mL  30 mL Oral Daily PRN Malvin Johns, MD   30 mL at 04/13/19 2114  . OLANZapine (ZYPREXA) tablet 20 mg  20 mg Oral QHS Antonieta Pert, MD   20 mg at 04/14/19 2111  . prazosin (MINIPRESS) capsule 1 mg  1 mg Oral QHS Antonieta Pert, MD   1 mg at 04/14/19 2111  . temazepam (RESTORIL) capsule 30 mg  30 mg Oral QHS Malvin Johns, MD   30 mg at 04/14/19 2111  . traZODone (DESYREL) tablet 100 mg  100 mg Oral QHS PRN Malvin Johns, MD   100 mg at 04/14/19 2111    Lab Results: No results found for this or any previous visit (from the past 48 hour(s)).  Blood Alcohol level:  Lab Results  Component Value Date   ETH <10 04/11/2019   ETH <10 01/19/2019    Metabolic Disorder Labs: Lab Results  Component Value Date   HGBA1C 5.4 08/29/2015   MPG 108 08/29/2015   MPG 111 06/04/2014   Lab Results  Component Value Date   PROLACTIN 113.1 (H) 08/29/2015   Lab Results  Component Value Date   CHOL 135 05/04/2016   TRIG 57 05/04/2016   HDL 56 05/04/2016   CHOLHDL 2.4 05/04/2016   VLDL 11 05/04/2016   LDLCALC 68 05/04/2016   LDLCALC 106 (H) 08/29/2015    Physical Findings: AIMS:  , ,  ,  ,    CIWA:  CIWA-Ar Total: 0 COWS:     Musculoskeletal: Strength & Muscle Tone: within normal limits Gait & Station: normal Patient leans: N/A  Psychiatric  Specialty Exam: Physical Exam  Constitutional: He is oriented to person, place, and time. He appears well-developed.  Eyes: Pupils are equal, round, and reactive to light.  Neck: Normal range of motion.  Respiratory: Effort normal.  Musculoskeletal: Normal range of motion.  Neurological: He is alert and oriented to person, place, and time.    Review of Systems  Constitutional: Negative for chills and fever.  Gastrointestinal: Negative for abdominal pain, constipation, diarrhea, nausea and vomiting.  Psychiatric/Behavioral: Negative for depression (+ pmhx but "this has never been a concern for me"), hallucinations (Hx of AVH but denies today.  ) and substance abuse (Hx for cocaine, methampethamine and THC use).    Blood pressure 139/86, pulse (!) 102, temperature 97.7 F (36.5 C), temperature source Oral, resp. rate 18, height 6' (1.829 m), weight 93 kg, SpO2 97 %.Body mass index is 27.8 kg/m.  General Appearance: Casual and wearing street clothes  Eye Contact:  Fair  Speech:  Clear and Coherent and Normal Rate  Volume:  Normal  Mood:  Anxious and Dysphoric  Affect:  Congruent and Full Range  Thought Process:  Coherent  Orientation:  Full (Time, Place, and Person)  Thought Content:  Logical  Suicidal Thoughts:  No  Homicidal Thoughts:  No  Memory:  Immediate;   Good Recent;   Good Remote;   Good  Judgement:  Impaired  Insight:  Fair  Psychomotor Activity:  Normal  Concentration:  Concentration: Good and Attention Span: Good  Recall:  Good  Fund of Knowledge:  Fair  Language:  Fair  Akathisia:  No  Handed:  Right  AIMS (if indicated):     Assets:  Desire for Improvement Resilience Social Support  ADL's:  Intact  Cognition:  WNL  Sleep:  Number of Hours: 6.5     Treatment Plan Summary: Daily contact with patient to assess and evaluate symptoms and progress in treatment and Medication management   Continue inpatient hopsitalization  Schizophrenia: Continue Zyprexa  20 mg po qhs  EPS: Continue Cogenetin 1 mg po twice daily  Mood Stabilization: Continue Tegretol 100 mg po twice daily  Anxiety: Continue Vistaril 50 mg po three times daily, prn  Lorazepam 2 mg po q6h, prn  PTSD: Continue prazosin po 1mg  qhs  Insomnia: Continue temazepam 30mg  po qhs Trazodone 100mg  po qhs, prn  Agitation/Psychosis: Continue Haldol 5mg  po qhs, prn Or Continue haldol 10mg  IM qh6, prn  Patient to attend and particiapte in the group milieu.  Discharge disposition is ongoing.  Mallie Darting, NP 04/15/2019, 10:50 AM  Attest to NP progress note

## 2019-04-15 NOTE — [facility] Group Notes (Signed)
[facility] LCSW Group Therapy Note  Date/Time:  04/15/2019  11:00AM-12:00PM  Type of Therapy and Topic:  Group Therapy:  Music and Mood  Participation Level:  Minimal   Description of Group: In this process group, members listened to a variety of genres of music and identified that different types of music evoke different responses.  Patients were encouraged to identify music that was soothing for them and music that was energizing for them.  Patients discussed how this knowledge can help with wellness and recovery in various ways including managing depression and anxiety as well as encouraging healthy sleep habits.    Therapeutic Goals: 1. Patients will explore the impact of different varieties of music on mood 2. Patients will verbalize the thoughts they have when listening to different types of music 3. Patients will identify music that is soothing to them as well as music that is energizing to them 4. Patients will discuss how to use this knowledge to assist in maintaining wellness and recovery 5. Patients will explore the use of music as a coping skill  Summary of Patient Progress:  At the beginning of group, patient was not present.  He came into the room with about 20 minutes left in group, stayed awhile, but then left and returned again.  He said the little bit of music he heard did not make him feel any different than he had earlier in the day.  Therapeutic Modalities: Solution Focused Brief Therapy Activity   Selmer Dominion, LCSW

## 2019-04-16 MED ORDER — HALOPERIDOL 5 MG PO TABS
5.0000 mg | ORAL_TABLET | Freq: Every day | ORAL | Status: DC
  Filled 2019-04-16 (×5): qty 1

## 2019-04-16 MED ORDER — ARIPIPRAZOLE 2 MG PO TABS: 2.0000 mg | ORAL_TABLET | Freq: Once | ORAL | Status: DC

## 2019-04-16 MED ORDER — HALOPERIDOL 5 MG PO TABS
15.0000 mg | ORAL_TABLET | Freq: Every day | ORAL | Status: DC
  Filled 2019-04-16 (×4): qty 3

## 2019-04-16 NOTE — Progress Notes (Signed)
Recreation Therapy Notes  Date: 9.14.20 Time: 1000 Location: 500 [address] Dayroom  Group Topic: Wellness  Goal Area(s) Addresses:  Patient will define components of whole wellness. Patient will verbalize benefit of whole wellness.  Behavioral Response:  Minimal  Intervention:  Music  Activity:  Exercise.  LRT explained instructions for activity to patients.  Each patient was given the opportunity to lead the group in a stretching exercise or exercise of their choice.  Patients were encouraged to listen to their bodies, take breaks if needed and not push themselves if anything hurt.  Education:Wellness, Discharge Planning.   Education Outcome: Acknowledges education/In group clarification offered/Needs additional education.   Clinical Observations/Feedback:  Pt completed a few of the exercises and stretches.  Pt sat and watched for most of group.  Pt was appropriate during group session.    [name], LRT/CTRS     [name] A 04/16/2019 12:09 PM

## 2019-04-16 NOTE — Progress Notes (Signed)
[facility] MD Progress Note  04/16/2019 11:16 AM Carlos [name]  MRN:  [id number] Subjective:   This 28 year old patient was admitted due to noncompliance, he gives a decent interview for about 3 to 5 minutes but then he becomes more delusional the more he speaks he believes he only needs medications "when he feels sick" and does not understand the concept of ongoing maintenance medications for schizophrenia.  This of course led to his decompensation/and he did pretty well on the Haldol initially was switched to olanzapine over the weekend but once we discussed risk benefits side effects such as weight gain metabolic syndrome he would like to go back to Haldol.  Further he received a test dose of aripiprazole in anticipation of long-acting injectable.  Will clearly need long-acting injectable. Principal Problem: Paranoid schizophrenia (HCC) Diagnosis: Principal Problem:   Paranoid schizophrenia (HCC)  Total Time spent with patient: 20 minutes  Past Psychiatric History: Chronic noncompliance-cannabis abuse  Past Medical History:  Past Medical History:  Diagnosis Date  . Depression   . Psychotic disorder (HCC)   . PTSD (post-traumatic stress disorder)    No past surgical history on file. Family History:  Family History  Problem Relation Age of Onset  . Mental illness Brother    Family Psychiatric  History: neg Social History:  Social History   Substance and Sexual Activity  Alcohol Use No     Social History   Substance and Sexual Activity  Drug Use Yes  . Types: Cocaine, Marijuana, [name], IV    Social History   Socioeconomic History  . Marital status: Single    Spouse name: Not on file  . Number of children: Not on file  . Years of education: Not on file  . Highest education level: Not on file  Occupational History  . Not on file  Social Needs  . Financial resource strain: Not on file  . Food insecurity    Worry: Not on file    Inability: Not on file  .  Transportation needs    Medical: Not on file    Non-medical: Not on file  Tobacco Use  . Smoking status: Current Every Day Smoker    Packs/day: 3.00    Types: Cigarettes  . Smokeless tobacco: Never Used  Substance and Sexual Activity  . Alcohol use: No  . Drug use: Yes    Types: Cocaine, Marijuana, [name], IV  . Sexual activity: Yes    Birth control/protection: Condom  Lifestyle  . Physical activity    Days per week: Not on file    Minutes per session: Not on file  . Stress: Not on file  Relationships  . Social Musician on phone: Not on file    Gets together: Not on file    Attends religious service: Not on file    Active member of club or organization: Not on file    Attends meetings of clubs or organizations: Not on file    Relationship status: Not on file  Other Topics Concern  . Not on file  Social History Narrative  . Not on file   Additional Social History:                         Sleep: Fair  Appetite:  Fair  Current Medications: Current Facility-Administered Medications  Medication Dose Route Frequency Provider Last Rate Last Dose  . acetaminophen (TYLENOL) tablet 650 mg  650 mg Oral Q6H PRN Jeannine Kitten,  Aaron Edelman, MD   650 mg at 04/15/19 0835  . alum & mag hydroxide-simeth (MAALOX/MYLANTA) 200-200-20 MG/5ML suspension 30 mL  30 mL Oral Q4H PRN Johnn Hai, MD   30 mL at 04/15/19 1054  . ARIPiprazole (ABILIFY) tablet 2 mg  2 mg Oral Once Johnn Hai, MD      . benztropine (COGENTIN) tablet 1 mg  1 mg Oral BID Johnn Hai, MD   1 mg at 04/16/19 4034  . carbamazepine (TEGRETOL) chewable tablet 100 mg  100 mg Oral BID Sharma Covert, MD   100 mg at 04/16/19 7425  . haloperidol (HALDOL) tablet 15 mg  15 mg Oral QHS Johnn Hai, MD      . haloperidol (HALDOL) tablet 5 mg  5 mg Oral Q6H PRN Johnn Hai, MD       Or  . haloperidol lactate (HALDOL) injection 10 mg  10 mg Intramuscular Q6H PRN Johnn Hai, MD      . haloperidol  (HALDOL) tablet 5 mg  5 mg Oral Daily Johnn Hai, MD      . hydrOXYzine (ATARAX/VISTARIL) tablet 50 mg  50 mg Oral TID PRN Johnn Hai, MD   50 mg at 04/12/19 1804  . LORazepam (ATIVAN) tablet 2 mg  2 mg Oral Q6H PRN Sharma Covert, MD      . magnesium hydroxide (MILK OF MAGNESIA) suspension 30 mL  30 mL Oral Daily PRN Johnn Hai, MD   30 mL at 04/13/19 2114  . prazosin (MINIPRESS) capsule 1 mg  1 mg Oral QHS Sharma Covert, MD   1 mg at 04/15/19 2101  . temazepam (RESTORIL) capsule 30 mg  30 mg Oral QHS Johnn Hai, MD   30 mg at 04/15/19 2101  . traZODone (DESYREL) tablet 100 mg  100 mg Oral QHS PRN Johnn Hai, MD   100 mg at 04/14/19 2111    Lab Results: No results found for this or any previous visit (from the past 67 hour(s)).  Blood Alcohol level:  Lab Results  Component Value Date   ETH <10 04/11/2019   ETH <10 95/63/8756    Metabolic Disorder Labs: Lab Results  Component Value Date   HGBA1C 5.4 08/29/2015   MPG 108 08/29/2015   MPG 111 06/04/2014   Lab Results  Component Value Date   PROLACTIN 113.1 (H) 08/29/2015   Lab Results  Component Value Date   CHOL 135 05/04/2016   TRIG 57 05/04/2016   HDL 56 05/04/2016   CHOLHDL 2.4 05/04/2016   VLDL 11 05/04/2016   LDLCALC 68 05/04/2016   LDLCALC 106 (H) 08/29/2015    Physical Findings: AIMS: Facial and Oral Movements Muscles of Facial Expression: None, normal Lips and Perioral Area: None, normal Jaw: None, normal Tongue: None, normal,Extremity Movements Upper (arms, wrists, hands, fingers): None, normal Lower (legs, knees, ankles, toes): None, normal, Trunk Movements Neck, shoulders, hips: None, normal, Overall Severity Severity of abnormal movements (highest score from questions above): None, normal Incapacitation due to abnormal movements: None, normal Patient's awareness of abnormal movements (rate only patient's report): No Awareness, Dental Status Current problems with teeth and/or dentures?:  No Does patient usually wear dentures?: No  CIWA:  CIWA-Ar Total: 1 COWS:  COWS Total Score: 3  Musculoskeletal: Strength & Muscle Tone: within normal limits Gait & Station: normal Patient leans: N/A  Psychiatric Specialty Exam: Physical Exam  ROS  Blood pressure 111/81, pulse 65, temperature 97.8 F (36.6 C), temperature source Oral, resp. rate 18, height 6' (  1.829 m), weight 93 kg, SpO2 97 %.Body mass index is 27.8 kg/m.  General Appearance: Casual  Eye Contact:  Good  Speech:  Clear and Coherent  Volume:  Decreased  Mood:  Dysphoric  Affect:  Blunt  Thought Process:  Irrelevant and Descriptions of Associations: Loose  Orientation:  Full (Time, Place, and Person)  Thought Content:  Illogical and Tangential some disjointed and random statements but no formed delusions  Suicidal Thoughts:  No  Homicidal Thoughts:  No  Memory:  Immediate;   Fair Recent;   Fair Remote;   Fair  Judgement:  Fair  Insight:  Fair  Psychomotor Activity:  Normal  Concentration:  Concentration: Fair and Attention Span: Fair  Recall:  [employer]Fair  Fund of Knowledge:  Fair  Language:  Fair  Akathisia:  Negative  Handed:  Right  AIMS (if indicated):     Assets:  Communication Skills Desire for Improvement  ADL's:  Intact  Cognition:  WNL  Sleep:  Number of Hours: 5.25     Treatment Plan Summary: Daily contact with patient to assess and evaluate symptoms and progress in treatment and Medication management  Continue antipsychotic therapy but switched to haloperidol due to better response further we will give test dose of aripiprazole in anticipation of long-acting injectable, coordinate efforts with act team no change in precautions or sleep med.  Continue benztropine.  Malvin JohnsFARAH,Avacyn Kloosterman, MD 04/16/2019, 11:16 AM

## 2019-04-17 MED ORDER — ARIPIPRAZOLE ER 400 MG IM SRER
400.0000 mg | INTRAMUSCULAR | Status: DC
  Administered 2019-04-17: 400 mg via INTRAMUSCULAR

## 2019-04-17 MED ORDER — GABAPENTIN 300 MG PO CAPS
300.0000 mg | ORAL_CAPSULE | Freq: Three times a day (TID) | ORAL | Status: DC
  Administered 2019-04-17 – 2019-04-18 (×2): 300 mg via ORAL
  Filled 2019-04-17 (×9): qty 1

## 2019-04-17 NOTE — Progress Notes (Addendum)
D: Pt denies SI/HI/AVH. Pt refused Haldol, pt stated it makes his tongue swell. Pt stated he would take Zyprexa. Pt is pleasant and cooperative. Pt stated he wanted to leave. Writer explained pt IVC status. Pt stated he wanted to get rid of his ACTT team. Pt encouraged to list the reasons and discuss them with the social worker to find out what could be done.  A: Pt was offered support and encouragement. Pt was given some medications. Pt was encourage to attend groups. Q 15 minute checks were done for safety.  R:Pt attends groups and interacts well with peers and staff. Pt is taking medication. Pt has no complaints.Pt receptive to treatment and safety maintained on unit.

## 2019-04-17 NOTE — Progress Notes (Signed)
Recreation Therapy Notes  Date: 9.15.20 Time: 1000 Location: 500 Hall Dayroom  Group Topic: Communication  Goal Area(s) Addresses:  Patient will effectively communicate with peers in group.  Patient will verbalize benefit of healthy communication. Patient will verbalize positive effect of healthy communication on post d/c goals.  Patient will identify communication techniques that made activity effective for group.   Behavioral Response: Engaged  Intervention:  Pencils, Paper, Geometrical pictures  Activity:  Geometrical Drawings.  One member of the group described a geometrical drawing to the group.  The person describing the picture was to be as detailed as possible.  The remaining members of the group could only ask the speaker to repeat themselves.  They could not ask any detailed questions.  This process will be repeated 2 more times with two different pictures and 2 new presenters.  Education: Communication, Discharge Planning  Education Outcome: Acknowledges understanding/In group clarification offered/Needs additional education.   Clinical Observations/Feedback:  Pt felt he could have done better describing the shaded in shapes.  Pt spoke about how communication can be misunderstood when in public spaces because of the noise that is taking place.    [name], LRT/CTRS    [name] A 04/17/2019 11:34 AM

## 2019-04-17 NOTE — Progress Notes (Signed)
Adult Psychoeducational Group Note  Date:  04/17/2019 Time:  9:25 PM  Group Topic/Focus:  Wrap-Up Group:   The focus of this group is to help patients review their daily goal of treatment and discuss progress on daily workbooks.  Participation Level:  Active  Participation Quality:  Appropriate  Affect:  Appropriate  Cognitive:  Appropriate  Insight: Appropriate  Engagement in Group:  Developing/Improving  Modes of Intervention:  Discussion  Additional Comments:  Pt stated his goal for today was to talk with his doctor about discharge. Pt stated he accomplished his goal today. Pt stated he will be discharged tomorrow. Pt stated his relationship with his family has improved since he was admitted here. Pt stated he felt better about himself today. Pt rated his overall day a 9 out of 10. Pt stated his appetite was pretty good today. Pt stated his goal for tonight is to get a good night's rest. Pt stated he had pain in his right knee tonight. Pt nurse was made aware of the situation.  Pt stated if anything change he would alert staff.  Candy Sledge 04/17/2019, 9:25 PM

## 2019-04-17 NOTE — Progress Notes (Signed)
D: Pt denies SI/HI/AVH. Pt is pleasant and cooperative. Pt stated he felt better, felt hope for the future confident about moving forward. Can't allow negative people to affect him.  A: Pt was offered support and encouragement. Pt was given scheduled medications. Pt was encourage to attend groups. Q 15 minute checks were done for safety.  R:Pt attends groups and interacts well with peers and staff. Pt is taking medication. Pt has no complaints.Pt receptive to treatment and safety maintained on unit.

## 2019-04-17 NOTE — Progress Notes (Signed)
[facility] MD Progress Note  04/17/2019 10:24 AM Carlos [name]  MRN:  [id number] Subjective:   Patient focused on discharge she continues to be alert oriented to person place situation not exact date but cooperative conversant and overall stable however compliance at home is going to be an issue he is already stated that he would like to take meds "only when he needs them" and does not understand the concept of maintenance medications despite much instruction on this.  Denies current thoughts of harming self or others no EPS or TD but agreeable to long-acting injectable.  Spoke with brother with his permission he is going to be the general caretaker states that the cannabis usage daily does have a calming effect but we discussed how this may cause or exacerbate psychotic symptoms at any rate the patient will be on long-acting injectable and our plan is to discharge tomorrow Principal Problem: Paranoid schizophrenia (HCC) Diagnosis: Principal Problem:   Paranoid schizophrenia (HCC)  Total Time spent with patient: 15 minutes  Past Medical History:  Past Medical History:  Diagnosis Date  . Depression   . Psychotic disorder (HCC)   . PTSD (post-traumatic stress disorder)    No past surgical history on file. Family History:  Family History  Problem Relation Age of Onset  . Mental illness Brother    Family Psychiatric  History: neg Social History:  Social History   Substance and Sexual Activity  Alcohol Use No     Social History   Substance and Sexual Activity  Drug Use Yes  . Types: Cocaine, Marijuana, [name], IV    Social History   Socioeconomic History  . Marital status: Single    Spouse name: Not on file  . Number of children: Not on file  . Years of education: Not on file  . Highest education level: Not on file  Occupational History  . Not on file  Social Needs  . Financial resource strain: Not on file  . Food insecurity    Worry: Not on file   Inability: Not on file  . Transportation needs    Medical: Not on file    Non-medical: Not on file  Tobacco Use  . Smoking status: Current Every Day Smoker    Packs/day: 3.00    Types: Cigarettes  . Smokeless tobacco: Never Used  Substance and Sexual Activity  . Alcohol use: No  . Drug use: Yes    Types: Cocaine, Marijuana, [name], IV  . Sexual activity: Yes    Birth control/protection: Condom  Lifestyle  . Physical activity    Days per week: Not on file    Minutes per session: Not on file  . Stress: Not on file  Relationships  . Social Musician on phone: Not on file    Gets together: Not on file    Attends religious service: Not on file    Active member of club or organization: Not on file    Attends meetings of clubs or organizations: Not on file    Relationship status: Not on file  Other Topics Concern  . Not on file  Social History Narrative  . Not on file   Additional Social History:                         Sleep: Good  Appetite:  Good  Current Medications: Current Facility-Administered Medications  Medication Dose Route Frequency Provider Last Rate Last Dose  . acetaminophen (TYLENOL)  tablet 650 mg  650 mg Oral Q6H PRN Malvin JohnsFarah, Blaine Guiffre, MD   650 mg at 04/16/19 2121  . alum & mag hydroxide-simeth (MAALOX/MYLANTA) 200-200-20 MG/5ML suspension 30 mL  30 mL Oral Q4H PRN Malvin JohnsFarah, Brayen Bunn, MD   30 mL at 04/15/19 1054  . ARIPiprazole (ABILIFY) tablet 2 mg  2 mg Oral Once Malvin JohnsFarah, Gesenia Bantz, MD      . ARIPiprazole ER (ABILIFY MAINTENA) injection 400 mg  400 mg Intramuscular Q28 days Malvin JohnsFarah, Palestine Mosco, MD      . benztropine (COGENTIN) tablet 1 mg  1 mg Oral BID Malvin JohnsFarah, Dierks Wach, MD   1 mg at 04/17/19 0831  . carbamazepine (TEGRETOL) chewable tablet 100 mg  100 mg Oral BID Antonieta Pertlary, Greg Lawson, MD   100 mg at 04/17/19 0831  . gabapentin (NEURONTIN) capsule 300 mg  300 mg Oral TID Malvin JohnsFarah, Nana Vastine, MD      . haloperidol (HALDOL) tablet 15 mg  15 mg Oral QHS Malvin JohnsFarah,  Aldyn Toon, MD      . haloperidol (HALDOL) tablet 5 mg  5 mg Oral Q6H PRN Malvin JohnsFarah, Pepper Kerrick, MD       Or  . haloperidol lactate (HALDOL) injection 10 mg  10 mg Intramuscular Q6H PRN Malvin JohnsFarah, Roan Miklos, MD      . haloperidol (HALDOL) tablet 5 mg  5 mg Oral Daily Malvin JohnsFarah, Geralene Afshar, MD      . hydrOXYzine (ATARAX/VISTARIL) tablet 50 mg  50 mg Oral TID PRN Malvin JohnsFarah, Amand Lemoine, MD   50 mg at 04/12/19 1804  . LORazepam (ATIVAN) tablet 2 mg  2 mg Oral Q6H PRN Antonieta Pertlary, Greg Lawson, MD      . magnesium hydroxide (MILK OF MAGNESIA) suspension 30 mL  30 mL Oral Daily PRN Malvin JohnsFarah, Donyel Nester, MD   30 mL at 04/13/19 2114  . prazosin (MINIPRESS) capsule 1 mg  1 mg Oral QHS Antonieta Pertlary, Greg Lawson, MD   1 mg at 04/16/19 2101  . temazepam (RESTORIL) capsule 30 mg  30 mg Oral QHS Malvin JohnsFarah, Marly Schuld, MD   30 mg at 04/16/19 2100  . traZODone (DESYREL) tablet 100 mg  100 mg Oral QHS PRN Malvin JohnsFarah, Brooke Steinhilber, MD   100 mg at 04/14/19 2111    Lab Results: No results found for this or any previous visit (from the past 48 hour(s)).  Blood Alcohol level:  Lab Results  Component Value Date   ETH <10 04/11/2019   ETH <10 01/19/2019    Metabolic Disorder Labs: Lab Results  Component Value Date   HGBA1C 5.4 08/29/2015   MPG 108 08/29/2015   MPG 111 06/04/2014   Lab Results  Component Value Date   PROLACTIN 113.1 (H) 08/29/2015   Lab Results  Component Value Date   CHOL 135 05/04/2016   TRIG 57 05/04/2016   HDL 56 05/04/2016   CHOLHDL 2.4 05/04/2016   VLDL 11 05/04/2016   LDLCALC 68 05/04/2016   LDLCALC 106 (H) 08/29/2015    Physical Findings: AIMS: Facial and Oral Movements Muscles of Facial Expression: None, normal Lips and Perioral Area: None, normal Jaw: None, normal Tongue: None, normal,Extremity Movements Upper (arms, wrists, hands, fingers): None, normal Lower (legs, knees, ankles, toes): None, normal, Trunk Movements Neck, shoulders, hips: None, normal, Overall Severity Severity of abnormal movements (highest score from questions above):  None, normal Incapacitation due to abnormal movements: None, normal Patient's awareness of abnormal movements (rate only patient's report): No Awareness, Dental Status Current problems with teeth and/or dentures?: No Does patient usually wear dentures?: No  CIWA:  CIWA-Ar  Total: 1 COWS:  COWS Total Score: 3  Musculoskeletal: Strength & Muscle Tone: within normal limits Gait & Station: normal Patient leans: N/A  Psychiatric Specialty Exam: Physical Exam  ROS  Blood pressure (!) 147/92, pulse 65, temperature 98.4 F (36.9 C), temperature source Oral, resp. rate 18, height 6' (1.829 m), weight 93 kg, SpO2 97 %.Body mass index is 27.8 kg/m.  General Appearance: Casual  Eye Contact:  Good  Speech:  Clear and Coherent  Volume:  Normal  Mood:  Euthymic  Affect:  Congruent  Thought Process:  Linear  Orientation:  Full (Time, Place, and Person)  Thought Content:  Logical and Rumination  Suicidal Thoughts:  No  Homicidal Thoughts:  No  Memory:  Immediate;   Fair Recent;   Poor  Judgement:  Fair  Insight:  Fair  Psychomotor Activity:  Normal  Concentration:  Concentration: Fair and Attention Span: Fair  Recall:  [employer] of Knowledge:  Fair  Language:  nl  Akathisia:  Negative  Handed:  Right  AIMS (if indicated):     Assets:  Physical Health Resilience  ADL's:  Intact  Cognition:  WNL  Sleep:  Number of Hours: 5     Treatment Plan Summary: Daily contact with patient to assess and evaluate symptoms and progress in treatment and Medication management  No change in meds or precautions other than the addition of long-acting injectable and the addition of Neurontin probable discharge tomorrow  Johnn Hai, MD 04/17/2019, 10:24 AM

## 2019-04-17 NOTE — BHH Suicide Risk Assessment (Signed)
[place] INPATIENT:  Family/Significant Other Suicide Prevention Education  Suicide Prevention Education:  Education Completed; Lijah Bourque, brother [phone] has been identified by the patient as the family member/significant other with whom the patient will be residing, and identified as the person(s) who will aid the patient in the event of a mental health crisis (suicidal ideations/suicide attempt).  With written consent from the patient, the family member/significant other has been provided the following suicide prevention education, prior to the and/or following the discharge of the patient.  The suicide prevention education provided includes the following:  Suicide risk factors  Suicide prevention and interventions  National Suicide Hotline telephone number  [facility] assessment telephone number  [facility] Emergency Assistance [place] and/or Residential Mobile Crisis Unit telephone number  Request made of family/significant other to:  Remove weapons (e.g., guns, rifles, knives), all items previously/currently identified as safety concern.    Remove drugs/medications (over-the-counter, prescriptions, illicit drugs), all items previously/currently identified as a safety concern.  The family member/significant other verbalizes understanding of the suicide prevention education information provided.  The family member/significant other agrees to remove the items of safety concern listed above.  CSW spoke with pts brother, Gerald Stabs who reported that pt has not been taking his meds and got stressed out and is unable to handle stress appropriately and that is what led pt to being here in the hospital. Per Gerald Stabs, he has no concerns with SI/HI or with pt returning home tomorrow at discharge. Gerald Stabs reported he will be picking pt up at discharge around 9am tomorrow 04/18/2019. Gerald Stabs reported there are no guns/weapons in the home. Gerald Stabs asked about pts medications and  pt switching to a shot and reported he would talk with pts ACT team to make sure they can take pt to get his shot every month as he works.  [place] MSW LCSW 04/17/2019, 3:49 PM

## 2019-04-17 NOTE — BHH Suicide Risk Assessment (Signed)
[place] INPATIENT:  Family/Significant Other Suicide Prevention Education  Suicide Prevention Education:  Contact Attempts: Kenniel Bergsma, brother [phone] has been identified by the patient as the family member/significant other with whom the patient will be residing, and identified as the person(s) who will aid the patient in the event of a mental health crisis.  With written consent from the patient, two attempts were made to provide suicide prevention education, prior to and/or following the patient's discharge.  We were unsuccessful in providing suicide prevention education.  A suicide education pamphlet was given to the patient to share with family/significant other.  Date and time of first attempt:04/17/2019 at 9:06AM Date and time of second attempt: 04/17/2019 at 3:42PM  CSW left a HIPAA compliant voicemail requesting a call back.   [place] MSW LCSW 04/17/2019, 3:42 PM

## 2019-04-17 NOTE — BHH Suicide Risk Assessment (Signed)
[place] INPATIENT:  Family/Significant Other Suicide Prevention Education  Suicide Prevention Education:  Contact Attempts: Xhaiden Coombs, brother [phone] has been identified by the patient as the family member/significant other with whom the patient will be residing, and identified as the person(s) who will aid the patient in the event of a mental health crisis.  With written consent from the patient, two attempts were made to provide suicide prevention education, prior to and/or following the patient's discharge.  We were unsuccessful in providing suicide prevention education.  A suicide education pamphlet was given to the patient to share with family/significant other.  Date and time of first attempt:04/17/2019 at 9:06AM Date and time of second attempt:  CSW left a HIPAA compliant voicemail requesting a call back.   Mariann Laster Otisha Spickler 04/17/2019, 9:06 AM

## 2019-04-18 LAB — RAPID URINE DRUG SCREEN, HOSP PERFORMED
Amphetamines: NOT DETECTED
Barbiturates: NOT DETECTED
Benzodiazepines: NOT DETECTED
Cocaine: NOT DETECTED
Opiates: NOT DETECTED
Tetrahydrocannabinol: POSITIVE — AB

## 2019-04-18 MED ORDER — ARIPIPRAZOLE ER 400 MG IM SRER: 400.0000 mg | INTRAMUSCULAR | 11 refills | Status: AC

## 2019-04-18 MED ORDER — HALOPERIDOL 20 MG PO TABS: 20.0000 mg | ORAL_TABLET | Freq: Every day | ORAL | 1 refills | Status: AC

## 2019-04-18 MED ORDER — GABAPENTIN 400 MG PO CAPS: 400.0000 mg | ORAL_CAPSULE | Freq: Three times a day (TID) | ORAL | 2 refills | Status: AC

## 2019-04-18 MED ORDER — BENZTROPINE MESYLATE 1 MG PO TABS: 1.0000 mg | ORAL_TABLET | Freq: Two times a day (BID) | ORAL | 2 refills | Status: AC

## 2019-04-18 MED ORDER — TEMAZEPAM 30 MG PO CAPS: 30.0000 mg | ORAL_CAPSULE | Freq: Every evening | ORAL | 0 refills | Status: AC | PRN

## 2019-04-18 NOTE — Discharge Summary (Signed)
Physician Discharge Summary Note  Patient:  Carlos [name] is an 28 y.o., male MRN:  [id number] DOB:  [birth year] Patient phone:  [phone] (home)  Patient address:   [address] [place] [address],  Total Time spent with patient: 45 minutes  Date of Admission:  04/12/2019 Date of Discharge: 04/18/2019  Reason for Admission:     History of Present Illness: Patient is seen and examined patient is a 28 year old male with a past psychiatric history significant for schizophrenia as well as posttraumatic stress disorder who was originally sent to the behavioral health hospital on 04/11/2019 under involuntary commitment. At that time his ACTT team told him to come to the hospital because he had been noncompliant with his medications. The patient stated he had not been noncompliant. He was argumentative and irritated, and then was sent to the medical hospital for clearance. Staff had reported that he had suicidal ideation, but at that time was not a great historian. He was refusing to talk to staff. He was sent back to our facility after clearance. He is currently disorganized, admitted to auditory and visual hallucinations, and is clearly paranoid as well. He did state that he was taking olanzapine per his ACTTteam, but we are still waiting on his med rec information. He also stated he was on a mood stabilizer but was unsure of which 1. He stated he did smoke marijuana, and wanted to know why his hallucinations worsened after the marijuana. He was admitted to the hospital for evaluation and stabilization.   Principal Problem: Paranoid schizophrenia [facility]) Discharge Diagnoses: Principal Problem:   Paranoid schizophrenia (HCC)   Past Psychiatric History: Chronic noncompliance and chronic cannabis dependency Past Medical History:  Past Medical History:  Diagnosis Date  . Depression   . Psychotic disorder (HCC)   . PTSD (post-traumatic stress disorder)    No past  surgical history on file. Family History:  Family History  Problem Relation Age of Onset  . Mental illness Brother    Family Psychiatric  History: Did not elaborate Social History:  Social History   Substance and Sexual Activity  Alcohol Use No     Social History   Substance and Sexual Activity  Drug Use Yes  . Types: Cocaine, Marijuana, [name], IV    Social History   Socioeconomic History  . Marital status: Single    Spouse name: Not on file  . Number of children: Not on file  . Years of education: Not on file  . Highest education level: Not on file  Occupational History  . Not on file  Social Needs  . Financial resource strain: Not on file  . Food insecurity    Worry: Not on file    Inability: Not on file  . Transportation needs    Medical: Not on file    Non-medical: Not on file  Tobacco Use  . Smoking status: Current Every Day Smoker    Packs/day: 3.00    Types: Cigarettes  . Smokeless tobacco: Never Used  Substance and Sexual Activity  . Alcohol use: No  . Drug use: Yes    Types: Cocaine, Marijuana, [name], IV  . Sexual activity: Yes    Birth control/protection: Condom  Lifestyle  . Physical activity    Days per week: Not on file    Minutes per session: Not on file  . Stress: Not on file  Relationships  . Social connections    Talks on phone: Not on file  Gets together: Not on file    Attends religious service: Not on file    Active member of club or organization: Not on file    Attends meetings of clubs or organizations: Not on file    Relationship status: Not on file  Other Topics Concern  . Not on file  Social History Narrative  . Not on file    Hospital Course:    Patient was very passive during his stay he displayed no dangerous behaviors we discussed long-acting injectable medications with him of course he has chronic noncompliance we also discussed abstinence from cannabis.  At any rate he did receive his  long-acting injectable of aripiprazole on 9/15.  At this point in time, the point of discharge he seems baseline he is alert fully oriented and cooperative denies thoughts of harming self or others and can contract fully.  There are no changes in his medication regimen at the point of discharge. Told to abstain of course from cannabis but this is a chronic issue for him.  Physical Findings: AIMS: Facial and Oral Movements Muscles of Facial Expression: None, normal Lips and Perioral Area: None, normal Jaw: None, normal Tongue: None, normal,Extremity Movements Upper (arms, wrists, hands, fingers): None, normal Lower (legs, knees, ankles, toes): None, normal, Trunk Movements Neck, shoulders, hips: None, normal, Overall Severity Severity of abnormal movements (highest score from questions above): None, normal Incapacitation due to abnormal movements: None, normal Patient's awareness of abnormal movements (rate only patient's report): No Awareness, Dental Status Current problems with teeth and/or dentures?: No Does patient usually wear dentures?: No  CIWA:  CIWA-Ar Total: 1 COWS:  COWS Total Score: 3  Musculoskeletal: Strength & Muscle Tone: within normal limits Gait & Station: normal Patient leans: N/A  Psychiatric Specialty Exam: ROS  Blood pressure 106/62, pulse 88, temperature 98.2 F (36.8 C), temperature source Oral, resp. rate 18, height 6' (1.829 m), weight 93 kg, SpO2 97 %.Body mass index is 27.8 kg/m.  General Appearance: Casual  Eye Contact::  Good  Speech:  Clear and Coherent409  Volume:  Decreased  Mood:  Dysphoric  Affect:  Restricted  Thought Process:  Coherent and Descriptions of Associations: Circumstantial  Orientation:  full  Thought Content:  poverty  Suicidal Thoughts:  No  Homicidal Thoughts:  No  Memory:  Immediate;   Poor Recent;   Fair Remote;   Fair  Judgement:  Fair  Insight:  Fair  Psychomotor Activity:  Normal  Concentration:  Fair  Recall:   Fair  Fund of Knowledge:Fair  Language: nl  Akathisia:  neg  Handed:  Right  AIMS (if indicated):     Assets:  Manufacturing systems engineerCommunication Skills Housing Physical Health Resilience Social Support  Sleep:  Number of Hours: 5.5  Cognition: WNL  ADL's:  Intact         Has this patient used any form of tobacco in the last 30 days? (Cigarettes, Smokeless Tobacco, Cigars, and/or Pipes) Yes, N/A  Blood Alcohol level:  Lab Results  Component Value Date   ETH <10 04/11/2019   ETH <10 01/19/2019    Metabolic Disorder Labs:  Lab Results  Component Value Date   HGBA1C 5.4 08/29/2015   MPG 108 08/29/2015   MPG 111 06/04/2014   Lab Results  Component Value Date   PROLACTIN 113.1 (H) 08/29/2015   Lab Results  Component Value Date   CHOL 135 05/04/2016   TRIG 57 05/04/2016   HDL 56 05/04/2016   CHOLHDL 2.4 05/04/2016   VLDL  11 05/04/2016   LDLCALC 68 05/04/2016   LDLCALC 106 (H) 08/29/2015    See Psychiatric Specialty Exam and Suicide Risk Assessment completed by Attending Physician prior to discharge.  Discharge destination:  Home  Is patient on multiple antipsychotic therapies at discharge:  No   Has Patient had three or more failed trials of antipsychotic monotherapy by history:  No  Recommended Plan for Multiple Antipsychotic Therapies: NA   Allergies as of 04/18/2019      Reactions   Depakote [divalproex Sodium] Swelling   Tongue swells   Invega [paliperidone Er] Swelling   Other Other (See Comments)   Patient stated no injections      Medication List    STOP taking these medications   OLANZapine 15 MG tablet Commonly known as: ZYPREXA     TAKE these medications     Indication  ARIPiprazole ER 400 MG Srer injection Commonly known as: ABILIFY MAINTENA Inject 2 mLs (400 mg total) into the muscle every 28 (twenty-eight) days. Due next 10/14 Start taking on: May 15, 2019  Indication: Schizophrenia   benztropine 1 MG tablet Commonly known as: COGENTIN Take  1 tablet (1 mg total) by mouth 2 (two) times daily.  Indication: Extrapyramidal Reaction caused by Medications   gabapentin 400 MG capsule Commonly known as: NEURONTIN Take 1 capsule (400 mg total) by mouth 3 (three) times daily.  Indication: Social Anxiety Disorder   haloperidol 20 MG tablet Commonly known as: HALDOL Take 1 tablet (20 mg total) by mouth at bedtime.  Indication: Psychosis   Melatonin 3 MG Tabs Take 3 mg by mouth at bedtime.  Indication: Trouble Sleeping   temazepam 30 MG capsule Commonly known as: RESTORIL Take 1 capsule (30 mg total) by mouth at bedtime as needed for sleep.  Indication: Trouble Sleeping      Follow-up Information    [facility] Follow up.   Why: ACTT services will continue when you discharge. Your ACTT team will follow up with you. Please ask you team about paperwork for the [facility].  Contact information: [address] [place] [address] [phone]           SignedJohnn Hai, MD 04/18/2019, 7:53 AM

## 2019-04-18 NOTE — Progress Notes (Signed)
[facility] Child/Adolescent Case Management Discharge Plan :  Will you be returning to the same living situation after discharge: Yes,  will return to same living.  At discharge, do you have transportation home?:Yes,  Brother will pick patient up around 11 or so.  Do you have the ability to pay for your medications:Yes,  reported at intake having the ability to pay for medications.   Release of information consent forms completed and in the chart;  Patient's signature needed at discharge.  Patient to Follow up at: Follow-up Information    [facility] Follow up on 04/19/2019.   Why: ACTT services will meet with you Thursday, 04/19/19, in the afternoon at your home. ACTT has sent over your paperwork for the V.A. in January, you will need to follow up after discharge with clinic.  Contact information: [address] [place] [address] [phone]            Patient denies SI/HI:   Yes,  provider assessed and patient denies    Safety Planning and Suicide Prevention discussed:  Yes,  completed previously with brother.   Tye Savoy 04/18/2019, 10:08 AM

## 2019-04-18 NOTE — Plan of Care (Signed)
D: Pt denies SI/HI/AV hallucinations. Pt is pleasant and cooperative. Pt goal for today is to work on going home. Patient has minimal interaction with staff and peers.  A: Pt was offered support and encouragement. Pt was given scheduled medications. Pt was encourage to attend groups. Q 15 minute checks were done for safety.  R: Pt is taking medication. Pt has no complaints.Pt receptive to treatment and safety maintained on unit.    Problem: Self-Concept: Goal: Ability to identify factors that promote anxiety will improve Outcome: Progressing Goal: Level of anxiety will decrease Outcome: Progressing   Problem: Education: Goal: Will be free of psychotic symptoms Outcome: Progressing   Problem: Health Behavior/Discharge Planning: Goal: Compliance with prescribed medication regimen will improve Outcome: Progressing Note: Patient is taking medications as prescribed   Problem: Safety: Goal: Ability to remain free from injury will improve Outcome: Progressing Note: Patient denies SI and remains safe on unit

## 2019-04-18 NOTE — Plan of Care (Signed)
  Problem: Education: Goal: Knowledge of [facility] Education information/materials will improve Outcome: Adequate for Discharge Goal: Emotional status will improve Outcome: Adequate for Discharge Goal: Mental status will improve Outcome: Adequate for Discharge Goal: Verbalization of understanding the information provided will improve Outcome: Adequate for Discharge   Problem: Education: Goal: Ability to state activities that reduce stress will improve Outcome: Adequate for Discharge   Problem: Coping: Goal: Ability to identify and develop effective coping behavior will improve Outcome: Adequate for Discharge   Problem: Self-Concept: Goal: Ability to identify factors that promote anxiety will improve 04/18/2019 1410 by Jolene Provost, RN Outcome: Adequate for Discharge 04/18/2019 1400 by Jolene Provost, RN Outcome: Progressing Goal: Level of anxiety will decrease 04/18/2019 1410 by Jolene Provost, RN Outcome: Adequate for Discharge 04/18/2019 1400 by Jolene Provost, RN Outcome: Progressing Goal: Ability to modify response to factors that promote anxiety will improve Outcome: Adequate for Discharge   Problem: Activity: Goal: Will verbalize the importance of balancing activity with adequate rest periods Outcome: Adequate for Discharge   Problem: Education: Goal: Will be free of psychotic symptoms 04/18/2019 1410 by Jolene Provost, RN Outcome: Adequate for Discharge 04/18/2019 1400 by Jolene Provost, RN Outcome: Progressing Goal: Knowledge of the prescribed therapeutic regimen will improve Outcome: Adequate for Discharge   Problem: Coping: Goal: Coping ability will improve Outcome: Adequate for Discharge Goal: Will verbalize feelings Outcome: Adequate for Discharge   Problem: Health Behavior/Discharge Planning: Goal: Compliance with prescribed medication regimen will improve 04/18/2019 1410 by Jolene Provost, RN Outcome: Adequate for  Discharge 04/18/2019 1400 by Jolene Provost, RN Outcome: Progressing Note: Patient is taking medications as prescribed   Problem: Nutritional: Goal: Ability to achieve adequate nutritional intake will improve Outcome: Adequate for Discharge   Problem: Role Relationship: Goal: Ability to communicate needs accurately will improve Outcome: Adequate for Discharge Goal: Ability to interact with others will improve Outcome: Adequate for Discharge   Problem: Safety: Goal: Ability to redirect hostility and anger into socially appropriate behaviors will improve Outcome: Adequate for Discharge Goal: Ability to remain free from injury will improve 04/18/2019 1410 by Jolene Provost, RN Outcome: Adequate for Discharge 04/18/2019 1400 by Jolene Provost, RN Outcome: Progressing Note: Patient denies SI and remains safe on unit   Problem: Self-Care: Goal: Ability to participate in self-care as condition permits will improve Outcome: Adequate for Discharge   Problem: Self-Concept: Goal: Will verbalize positive feelings about self Outcome: Adequate for Discharge

## 2019-04-18 NOTE — [facility] Suicide Risk Assessment (Signed)
[facility] Discharge Suicide Risk Assessment   Principal Problem: Paranoid schizophrenia ([place]) Discharge Diagnoses: Principal Problem:   Paranoid schizophrenia ([place])   Total Time spent with patient: 45 minutes  Musculoskeletal: Strength & Muscle Tone: within normal limits Gait & Station: normal Patient leans: N/A  Psychiatric Specialty Exam: ROS  Blood pressure 106/62, pulse 88, temperature 98.2 F (36.8 C), temperature source Oral, resp. rate 18, height 6' (1.829 m), weight 93 kg, SpO2 97 %.Body mass index is 27.8 kg/m.  General Appearance: Casual  Eye Contact::  Good  Speech:  Clear and Coherent409  Volume:  Decreased  Mood:  Dysphoric  Affect:  Restricted  Thought Process:  Coherent and Descriptions of Associations: Circumstantial  Orientation:  full  Thought Content:  poverty  Suicidal Thoughts:  No  Homicidal Thoughts:  No  Memory:  Immediate;   Poor Recent;   Fair Remote;   Fair  Judgement:  Fair  Insight:  Fair  Psychomotor Activity:  Normal  Concentration:  Fair  Recall:  [employer] of Knowledge:Fair  Language: nl  Akathisia:  neg  Handed:  Right  AIMS (if indicated):     Assets:  Chief Executive Officer Physical Health Resilience Social Support  Sleep:  Number of Hours: 5.5  Cognition: WNL  ADL's:  Intact   Mental Status Per Nursing Assessment::   On Admission:  NA  Demographic Factors:  Male and Unemployed  Loss Factors: NA  Historical Factors: NA  Risk Reduction Factors:   Positive social support and Positive therapeutic relationship  Continued Clinical Symptoms:  Schizophrenia:   Less than 34 years old  Cognitive Features That Contribute To Risk:  Loss of executive function    Suicide Risk:  Minimal: No identifiable suicidal ideation.  Patients presenting with no risk factors but with morbid ruminations; may be classified as minimal risk based on the severity of the depressive symptoms  Follow-up Information    [facility] Follow up.    Why: ACTT services will continue when you discharge. Your ACTT team will follow up with you. Please ask you team about paperwork for the V.A.  Contact information: [address] [place] [address] [phone]           Plan Of Care/Follow-up recommendations:  Activity:  full  Alonni Heimsoth, MD 04/18/2019, 7:51 AM

## 2019-04-18 NOTE — Progress Notes (Signed)
Nursing Discharge  Note:  D:Patient denies SI/HI at this time. Pt appears calm and cooperative, and no distress noted. AVS/Follow-Up appointments/Prescriptions reviewed with patient and written copies given to patient and he verbalized understanding.   A: All Personal items in locker returned to patient  and he verified receipt.. Pt escorted out of the building by staff to meet brother.  R:  Pt States she will comply with outpatient services, and take medications as prescribed.

## 2021-03-19 DIAGNOSIS — Z0001 Encounter for general adult medical examination with abnormal findings: Secondary | ICD-10-CM | POA: Diagnosis not present

## 2021-03-19 DIAGNOSIS — M4180 Other forms of scoliosis, site unspecified: Secondary | ICD-10-CM | POA: Diagnosis not present

## 2021-03-19 DIAGNOSIS — Z136 Encounter for screening for cardiovascular disorders: Secondary | ICD-10-CM | POA: Diagnosis not present

## 2021-03-19 DIAGNOSIS — Z720 Tobacco use: Secondary | ICD-10-CM | POA: Diagnosis not present

## 2021-03-19 DIAGNOSIS — Z1329 Encounter for screening for other suspected endocrine disorder: Secondary | ICD-10-CM | POA: Diagnosis not present

## 2021-03-19 DIAGNOSIS — Z131 Encounter for screening for diabetes mellitus: Secondary | ICD-10-CM | POA: Diagnosis not present

## 2021-09-03 DIAGNOSIS — Z720 Tobacco use: Secondary | ICD-10-CM | POA: Diagnosis not present

## 2021-09-03 DIAGNOSIS — Z0001 Encounter for general adult medical examination with abnormal findings: Secondary | ICD-10-CM | POA: Diagnosis not present

## 2021-09-03 DIAGNOSIS — M4180 Other forms of scoliosis, site unspecified: Secondary | ICD-10-CM | POA: Diagnosis not present

## 2021-09-03 DIAGNOSIS — N179 Acute kidney failure, unspecified: Secondary | ICD-10-CM | POA: Diagnosis not present

## 2021-10-03 ENCOUNTER — Emergency Department (HOSPITAL_COMMUNITY)
Admission: EM | Admit: 2021-10-03 | Discharge: 2021-10-03 | Disposition: A | Discharge status: Home / Self Care | Payer: Medicare Other | Attending: Emergency Medicine | Admitting: Emergency Medicine

## 2021-10-03 ENCOUNTER — Encounter (HOSPITAL_COMMUNITY): Payer: Self-pay

## 2021-10-03 ENCOUNTER — Other Ambulatory Visit: Payer: Self-pay

## 2021-10-03 ENCOUNTER — Emergency Department (HOSPITAL_COMMUNITY): Payer: Medicare Other

## 2021-10-03 DIAGNOSIS — Y92481 Parking lot as the place of occurrence of the external cause: Secondary | ICD-10-CM | POA: Diagnosis not present

## 2021-10-03 DIAGNOSIS — M79603 Pain in arm, unspecified: Secondary | ICD-10-CM | POA: Diagnosis not present

## 2021-10-03 DIAGNOSIS — S4991XA Unspecified injury of right shoulder and upper arm, initial encounter: Secondary | ICD-10-CM

## 2021-10-03 DIAGNOSIS — Z041 Encounter for examination and observation following transport accident: Secondary | ICD-10-CM | POA: Diagnosis not present

## 2021-10-03 DIAGNOSIS — M25519 Pain in unspecified shoulder: Secondary | ICD-10-CM | POA: Diagnosis not present

## 2021-10-03 DIAGNOSIS — Z743 Need for continuous supervision: Secondary | ICD-10-CM | POA: Diagnosis not present
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
## 2021-10-03 NOTE — ED Notes (Signed)
Shoulder immobilizer applied at this time .Size XL ?
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
## 2021-10-03 NOTE — Discharge Instructions (Signed)
Your x-ray was negative for fracture or dislocation.  However, given the fact that you feel like you heard a pop when you injured it, there is a chance that you may have torn something in your rotator cuff.  I have given you a sling that you can wear for comfort.  I recommend icing the shoulder as often as possible over the next 24 to 48 hours.  Alternate taking Tylenol and Motrin for your pain.  If your shoulder does not seem to be improving after about 2 weeks, please follow-up with the orthopedic referral provided as you may need an MRI to better evaluate all the tendons and ligaments in your shoulder. ?

## 2022-01-25 ENCOUNTER — Other Ambulatory Visit: Payer: Self-pay

## 2022-01-25 ENCOUNTER — Encounter (HOSPITAL_COMMUNITY): Payer: Self-pay

## 2022-01-25 ENCOUNTER — Emergency Department (HOSPITAL_COMMUNITY)
Admission: EM | Admit: 2022-01-25 | Discharge: 2022-01-25 | Disposition: A | Discharge status: Home / Self Care | Payer: Medicare Other | Attending: Emergency Medicine | Admitting: Emergency Medicine

## 2022-01-25 DIAGNOSIS — R42 Dizziness and giddiness: Secondary | ICD-10-CM | POA: Insufficient documentation

## 2022-01-25 DIAGNOSIS — R0689 Other abnormalities of breathing: Secondary | ICD-10-CM | POA: Diagnosis not present

## 2022-01-25 DIAGNOSIS — R52 Pain, unspecified: Secondary | ICD-10-CM | POA: Diagnosis not present

## 2022-01-25 DIAGNOSIS — Z743 Need for continuous supervision: Secondary | ICD-10-CM | POA: Diagnosis not present

## 2022-01-25 DIAGNOSIS — R404 Transient alteration of awareness: Secondary | ICD-10-CM | POA: Diagnosis not present

## 2022-01-25 DIAGNOSIS — G4489 Other headache syndrome: Secondary | ICD-10-CM | POA: Diagnosis not present

## 2022-01-25 LAB — COMPREHENSIVE METABOLIC PANEL
ALT: 26 U/L (ref 0–44)
AST: 27 U/L (ref 15–41)
Albumin: 4.1 g/dL (ref 3.5–5.0)
Alkaline Phosphatase: 51 U/L (ref 38–126)
Anion gap: 10 (ref 5–15)
BUN: 9 mg/dL (ref 6–20)
CO2: 22 mmol/L (ref 22–32)
Calcium: 9.1 mg/dL (ref 8.9–10.3)
Chloride: 102 mmol/L (ref 98–111)
Creatinine, Ser: 1.25 mg/dL — ABNORMAL HIGH (ref 0.61–1.24)
GFR, Estimated: >60 mL/min (ref >60)
Glucose, Bld: 88 mg/dL (ref 70–99)
Potassium: 3.5 mmol/L (ref 3.5–5.1)
Sodium: 134 mmol/L — ABNORMAL LOW (ref 135–145)
Total Bilirubin: 1.3 mg/dL — ABNORMAL HIGH (ref 0.3–1.2)
Total Protein: 7.5 g/dL (ref 6.5–8.1)

## 2022-01-25 LAB — CBC WITH DIFFERENTIAL/PLATELET
Abs Immature Granulocytes: 0.02 10*3/uL (ref 0.00–0.07)
Basophils Absolute: 0 10*3/uL (ref 0.0–0.1)
Basophils Relative: 0 %
Eosinophils Absolute: 0 10*3/uL (ref 0.0–0.5)
Eosinophils Relative: 0 %
HCT: 43.7 % (ref 39.0–52.0)
Hemoglobin: 14.8 g/dL (ref 13.0–17.0)
Immature Granulocytes: 0 %
Lymphocytes Relative: 11 %
Lymphs Abs: 1 10*3/uL (ref 0.7–4.0)
MCH: 29.9 pg (ref 26.0–34.0)
MCHC: 33.9 g/dL (ref 30.0–36.0)
MCV: 88.3 fL (ref 80.0–100.0)
Monocytes Absolute: 0.4 10*3/uL (ref 0.1–1.0)
Monocytes Relative: 4 %
Neutro Abs: 7.2 10*3/uL (ref 1.7–7.7)
Neutrophils Relative %: 85 %
Platelets: 233 10*3/uL (ref 150–400)
RBC: 4.95 MIL/uL (ref 4.22–5.81)
RDW: 13.7 % (ref 11.5–15.5)
WBC: 8.6 10*3/uL (ref 4.0–10.5)
nRBC: 0 % (ref 0.0–0.2)
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
## 2022-10-28 ENCOUNTER — Ambulatory Visit (HOSPITAL_COMMUNITY)
Admission: EM | Admit: 2022-10-28 | Discharge: 2022-10-29 | Disposition: A | Discharge status: Other Acute Inpatient Hospital | Payer: 59 | Attending: Psychiatry | Admitting: Psychiatry

## 2022-10-28 DIAGNOSIS — F329 Major depressive disorder, single episode, unspecified: Secondary | ICD-10-CM | POA: Insufficient documentation

## 2022-10-28 DIAGNOSIS — F209 Schizophrenia, unspecified: Secondary | ICD-10-CM | POA: Diagnosis not present

## 2022-10-28 DIAGNOSIS — Z5900 Homelessness unspecified: Secondary | ICD-10-CM

## 2022-10-28 DIAGNOSIS — R410 Disorientation, unspecified: Secondary | ICD-10-CM | POA: Insufficient documentation

## 2022-10-28 DIAGNOSIS — F1210 Cannabis abuse, uncomplicated: Secondary | ICD-10-CM | POA: Diagnosis not present

## 2022-10-28 DIAGNOSIS — F4310 Post-traumatic stress disorder, unspecified: Secondary | ICD-10-CM | POA: Diagnosis not present

## 2022-10-28 DIAGNOSIS — Z1152 Encounter for screening for COVID-19: Secondary | ICD-10-CM | POA: Diagnosis not present

## 2022-10-28 LAB — POCT URINE DRUG SCREEN - MANUAL ENTRY (I-SCREEN)
POC Amphetamine UR: NOT DETECTED
POC Buprenorphine (BUP): NOT DETECTED
POC Cocaine UR: NOT DETECTED
POC Marijuana UR: POSITIVE — AB
POC Methadone UR: NOT DETECTED
POC Methamphetamine UR: NOT DETECTED
POC Morphine: NOT DETECTED
POC Oxazepam (BZO): POSITIVE — AB
POC Oxycodone UR: NOT DETECTED
POC Secobarbital (BAR): NOT DETECTED

## 2022-10-28 MED ORDER — MELATONIN 3 MG PO TABS
3.0000 mg | ORAL_TABLET | Freq: Every day | ORAL | Status: DC
  Administered 2022-10-29: 3 mg via ORAL
  Filled 2022-10-28: qty 1

## 2022-10-28 MED ORDER — BENZTROPINE MESYLATE 1 MG PO TABS
1.0000 mg | ORAL_TABLET | Freq: Two times a day (BID) | ORAL | Status: DC
  Administered 2022-10-29: 1 mg via ORAL
  Filled 2022-10-28 (×2): qty 1

## 2022-10-28 MED ORDER — HALOPERIDOL 5 MG PO TABS
20.0000 mg | ORAL_TABLET | Freq: Every day | ORAL | Status: DC
  Administered 2022-10-29: 20 mg via ORAL
  Filled 2022-10-28: qty 4

## 2022-10-28 MED ORDER — ARIPIPRAZOLE ER 400 MG IM SRER
400.0000 mg | INTRAMUSCULAR | Status: DC
  Filled 2022-10-28: qty 2

## 2022-10-28 MED ORDER — MAGNESIUM HYDROXIDE 400 MG/5ML PO SUSP: 30.0000 mL | Freq: Every day | ORAL | Status: DC | PRN

## 2022-10-28 MED ORDER — TEMAZEPAM 15 MG PO CAPS
30.0000 mg | ORAL_CAPSULE | Freq: Every evening | ORAL | Status: DC | PRN
  Administered 2022-10-29: 30 mg via ORAL
  Filled 2022-10-28: qty 2

## 2022-10-28 MED ORDER — GABAPENTIN 400 MG PO CAPS
400.0000 mg | ORAL_CAPSULE | Freq: Three times a day (TID) | ORAL | Status: DC
  Filled 2022-10-28: qty 1

## 2022-10-28 MED ORDER — OLANZAPINE 10 MG PO TBDP: 10.0000 mg | ORAL_TABLET | Freq: Three times a day (TID) | ORAL | Status: DC | PRN

## 2022-10-28 MED ORDER — ACETAMINOPHEN 325 MG PO TABS: 650.0000 mg | ORAL_TABLET | Freq: Four times a day (QID) | ORAL | Status: DC | PRN

## 2022-10-28 MED ORDER — ZIPRASIDONE MESYLATE 20 MG IM SOLR: 20.0000 mg | INTRAMUSCULAR | Status: DC | PRN

## 2022-10-28 MED ORDER — LORAZEPAM 1 MG PO TABS
1.0000 mg | ORAL_TABLET | ORAL | Status: AC | PRN
  Administered 2022-10-29: 1 mg via ORAL
  Filled 2022-10-28: qty 1

## 2022-10-28 MED ORDER — ALUM & MAG HYDROXIDE-SIMETH 200-200-20 MG/5ML PO SUSP: 30.0000 mL | ORAL | Status: DC | PRN

## 2022-10-28 NOTE — ED Provider Notes (Signed)
[facility] Continuous Assessment Admission H&P  Date: 10/29/22 Patient Name: Carlos [name] MRN: [id number] Chief Complaint: increase anxiety and depression   Diagnoses:  Final diagnoses:  Marijuana abuse  Schizophrenia, unspecified type [facility])  Homelessness    HPI: Carlos [name],  32 y/o male with a history of schizophrenia, anxiety, PTSD.  Presented to [facility] voluntarily.  Patient was dropped off by his ACT team and we were not able to reach the ACT team.  Per the patient he has increased anxiety depression and PTSD and he is homeless because his apartment at [address] has been moving back and forth to [facility].  Patient is not a good historian at this moment, patient does seem to be under the influence of possibly marijuana, according to patient he is seeing foxes. A review of patient's records show that patient has prior hospital admissions for schizophrenia with psychosis.  Copied from Triage notes: Pt presents to [facility] voluntarily, accompanied by ACTT team staff with complaint of depression and anxiety. Pt thought process is scattered, trailing from one topic from another, and appears to be paranoid. Pt observed scanning the room and speaking of "they". Pt stated " they keep clocking me". Pt reports using marijuana tonight (unsure of amount). Pt has history of schizophrenia and denies taking medications at this time. Pt endorses AVH, but could not describe what he is experiencing. Pt currently denies SI, HI.   Face-to-face observation of patient, patient is alert and oriented to self.  Patient maintained minimal eye contact.  Patient is not fully coherent and is not a good historian.  Patient does seem anxious and confused.  During the interview process patient does pause before answering and at times patient seems to be loss for words.  Patient denies SI, HI, report he sees animals in boxes.  Patient did allude to the fact that he has not seen and that one of the ACT team  member dropped him off tonight.  Patient reports he smoked marijuana daily last time was prior to coming in patient denies alcohol use  Recommend inpatient observation until further information can be gathered from ACT team  Total Time spent with patient: 30 minutes  Musculoskeletal  Strength & Muscle Tone: within normal limits Gait & Station: normal Patient leans: N/A  Psychiatric Specialty Exam  Presentation General Appearance:  Casual  Eye Contact: Fair  Speech: Clear and Coherent  Speech Volume: Normal  Handedness: Right   Mood and Affect  Mood: Anxious; Depressed; Euthymic  Affect: Depressed; Constricted   Thought Process  Thought Processes: Linear  Descriptions of Associations:Loose  Orientation:Partial  Thought Content:Scattered    Hallucinations:Hallucinations: Visual Description of Visual Hallucinations: seeing foxes  Ideas of Reference:None  Suicidal Thoughts:Suicidal Thoughts: No  Homicidal Thoughts:Homicidal Thoughts: No   Sensorium  Memory: Immediate Fair  Judgment: Poor  Insight: Poor   Executive Functions  Concentration: Fair  Attention Span: Fair  Recall: [place] of Knowledge: Fair  Language: Fair   Psychomotor Activity  Psychomotor Activity: Psychomotor Activity: Normal   Assets  Assets: Desire for Improvement; Social Support   Sleep  Sleep: Sleep: Fair   Nutritional Assessment (For OBS and FBC admissions only) Has the patient had a weight loss or gain of 10 pounds or more in the last 3 months?: No Has the patient had a decrease in food intake/or appetite?: No Does the patient have dental problems?: No Does the patient have eating habits or behaviors that may be indicators of an eating  disorder including binging or inducing vomiting?: No Has the patient recently lost weight without trying?: 0 Has the patient been eating poorly because of a decreased appetite?: 0 Malnutrition Screening Tool  Score: 0    Physical Exam HENT:     Head: Normocephalic.     Nose: Nose normal.  Cardiovascular:     Rate and Rhythm: Normal rate.  Pulmonary:     Effort: Pulmonary effort is normal.  Musculoskeletal:        General: Normal range of motion.     Cervical back: Normal range of motion.  Neurological:     General: No focal deficit present.     Mental Status: He is alert.  Psychiatric:        Mood and Affect: Mood normal.        Behavior: Behavior normal.        Thought Content: Thought content normal.        Judgment: Judgment normal.    Review of Systems  Constitutional: Negative.   HENT: Negative.    Eyes: Negative.   Respiratory: Negative.    Cardiovascular: Negative.   Gastrointestinal: Negative.   Genitourinary: Negative.   Musculoskeletal: Negative.   Skin: Negative.   Neurological: Negative.   Psychiatric/Behavioral:  Positive for depression and substance abuse. The patient is nervous/anxious.     Blood pressure 116/80, pulse 71, temperature 98 F (36.7 C), temperature source Oral, resp. rate 20, SpO2 98 %. There is no height or weight on file to calculate BMI.  Past Psychiatric History: Schizophrenia, PTSD, depression, anxiety  Is the patient at risk to self? No  Has the patient been a risk to self in the past 6 months? No .    Has the patient been a risk to self within the distant past? No   Is the patient a risk to others? No   Has the patient been a risk to others in the past 6 months? No   Has the patient been a risk to others within the distant past? No   Past Medical History: See chart  Family History: Unknown  Social History: Marijuana and tobacco use  Last Labs:  Admission on 10/28/2022  Component Date Value Ref Range Status   SARS Coronavirus 2 by RT PCR 10/29/2022 NEGATIVE  NEGATIVE Final   Performed at [facility], [place] [address], [place], [place] [address]   WBC 10/29/2022 7.4  4.0 - 10.5 K/uL Final   RBC 10/29/2022 5.13  4.22 -  5.81 MIL/uL Final   Hemoglobin 10/29/2022 16.0  13.0 - 17.0 g/dL Final   HCT 10/29/2022 45.4  39.0 - 52.0 % Final   MCV 10/29/2022 88.5  80.0 - 100.0 fL Final   MCH 10/29/2022 31.2  26.0 - 34.0 pg Final   MCHC 10/29/2022 35.2  30.0 - 36.0 g/dL Final   RDW 10/29/2022 13.6  11.5 - 15.5 % Final   Platelets 10/29/2022 214  150 - 400 K/uL Final   nRBC 10/29/2022 0.0  0.0 - 0.2 % Final   Neutrophils Relative % 10/29/2022 66  % Final   Neutro Abs 10/29/2022 4.9  1.7 - 7.7 K/uL Final   Lymphocytes Relative 10/29/2022 26  % Final   Lymphs Abs 10/29/2022 1.9  0.7 - 4.0 K/uL Final   Monocytes Relative 10/29/2022 6  % Final   Monocytes Absolute 10/29/2022 0.5  0.1 - 1.0 K/uL Final   Eosinophils Relative 10/29/2022 1  % Final   Eosinophils Absolute 10/29/2022 0.0  0.0 - 0.5 K/uL Final   Basophils Relative 10/29/2022 1  % Final   Basophils Absolute 10/29/2022 0.1  0.0 - 0.1 K/uL Final   Immature Granulocytes 10/29/2022 0  % Final   Abs Immature Granulocytes 10/29/2022 0.02  0.00 - 0.07 K/uL Final   Performed at [facility], [place] [address] [place], [place] [address]   Sodium 10/29/2022 139  135 - 145 mmol/L Final   Potassium 10/29/2022 3.8  3.5 - 5.1 mmol/L Final   Chloride 10/29/2022 103  98 - 111 mmol/L Final   CO2 10/29/2022 25  22 - 32 mmol/L Final   Glucose, Bld 10/29/2022 81  70 - 99 mg/dL Final   Glucose reference range applies only to samples taken after fasting for at least 8 hours.   BUN 10/29/2022 10  6 - 20 mg/dL Final   Creatinine, Ser 10/29/2022 1.33 (H)  0.61 - 1.24 mg/dL Final   Calcium 10/29/2022 9.6  8.9 - 10.3 mg/dL Final   Total Protein 10/29/2022 7.1  6.5 - 8.1 g/dL Final   Albumin 10/29/2022 4.1  3.5 - 5.0 g/dL Final   AST 10/29/2022 20  15 - 41 U/L Final   ALT 10/29/2022 21  0 - 44 U/L Final   Alkaline Phosphatase 10/29/2022 54  38 - 126 U/L Final   Total Bilirubin 10/29/2022 1.4 (H)  0.3 - 1.2 mg/dL Final   GFR, Estimated 10/29/2022 >60  >60 mL/min Final    Comment: (NOTE) Calculated using the CKD-EPI Creatinine Equation (2021)    Anion gap 10/29/2022 11  5 - 15 Final   Performed at [facility] [address] [place],  [address]   SARSCOV2ONAVIRUS 2 AG 10/29/2022 NEGATIVE  NEGATIVE Final   Comment: (NOTE) SARS-CoV-2 antigen NOT DETECTED.   Negative results are presumptive.  Negative results do not preclude SARS-CoV-2 infection and should not be used as the sole basis for treatment or other patient management decisions, including infection  control decisions, particularly in the presence of clinical signs and  symptoms consistent with COVID-19, or in those who have been in contact with the virus.  Negative results must be combined with clinical observations, patient history, and epidemiological information. The expected result is Negative.  Fact Sheet for Patients: HandmadeRecipes.com.cy  Fact Sheet for Healthcare Providers: FuneralLife.at  This test is not yet approved or cleared by the [place] FDA and  has been authorized for detection and/or diagnosis of SARS-CoV-2 by FDA under an Emergency Use Authorization (EUA).  This EUA will remain in effect (meaning this test can be used) for the duration of  the COV                          ID-19 declaration under Section 564(b)(1) of the Act, 21 U.S.C. section 360bbb-3(b)(1), unless the authorization is terminated or revoked sooner.     Cholesterol 10/29/2022 151  0 - 200 mg/dL Final   Triglycerides 10/29/2022 45  <150 mg/dL Final   HDL 10/29/2022 56  >40 mg/dL Final   Total CHOL/HDL Ratio 10/29/2022 2.7  RATIO Final   VLDL 10/29/2022 9  0 - 40 mg/dL Final   LDL Cholesterol 10/29/2022 86  0 - 99 mg/dL Final   Comment:        Total Cholesterol/HDL:CHD Risk Coronary Heart Disease Risk Table                     Men  Women  1/2 Average Risk   3.4   3.3  Average Risk       5.0   4.4  2 X Average Risk   9.6   7.1  3 X  Average Risk  23.4   11.0        Use the calculated Patient Ratio above and the CHD Risk Table to determine the patient's CHD Risk.        ATP III CLASSIFICATION (LDL):  <100     mg/dL   Optimal  100-129  mg/dL   Near or Above                    Optimal  130-159  mg/dL   Borderline  160-189  mg/dL   High  >190     mg/dL   Very High Performed at [facility] [address], [place], [place] [address]    TSH 10/29/2022 2.378  0.350 - 4.500 uIU/mL Final   Comment: Performed by a 3rd Generation assay with a functional sensitivity of <=0.01 uIU/mL. Performed at [facility], [place] [address] [place], [place] [address]     Allergies: Depakote [divalproex sodium], Invega [paliperidone er], and Other  Medications:  Facility Ordered Medications  Medication   acetaminophen (TYLENOL) tablet 650 mg   alum & mag hydroxide-simeth (MAALOX/MYLANTA) 200-200-20 MG/5ML suspension 30 mL   magnesium hydroxide (MILK OF MAGNESIA) suspension 30 mL   OLANZapine zydis (ZYPREXA) disintegrating tablet 10 mg   And   [COMPLETED] LORazepam (ATIVAN) tablet 1 mg   And   ziprasidone (GEODON) injection 20 mg   temazepam (RESTORIL) capsule 30 mg   melatonin tablet 3 mg   gabapentin (NEURONTIN) capsule 400 mg   benztropine (COGENTIN) tablet 1 mg   ARIPiprazole ER (ABILIFY MAINTENA) injection 400 mg   haloperidol (HALDOL) tablet 20 mg   PTA Medications  Medication Sig   Melatonin 3 MG TABS Take 3 mg by mouth at bedtime.   ARIPiprazole ER (ABILIFY MAINTENA) 400 MG SRER injection Inject 2 mLs (400 mg total) into the muscle every 28 (twenty-eight) days. Due next 10/14   haloperidol (HALDOL) 20 MG tablet Take 1 tablet (20 mg total) by mouth at bedtime.   temazepam (RESTORIL) 30 MG capsule Take 1 capsule (30 mg total) by mouth at bedtime as needed for sleep.   benztropine (COGENTIN) 1 MG tablet Take 1 tablet (1 mg total) by mouth 2 (two) times daily.   gabapentin (NEURONTIN) 400 MG capsule Take 1  capsule (400 mg total) by mouth 3 (three) times daily.    Medical Decision Making  Inpatient observation   Lab Orders         SARS Coronavirus 2 by RT PCR (hospital order, performed in [facility] hospital lab) *cepheid single result test* Anterior Nasal Swab         CBC with Differential/Platelet         Comprehensive metabolic panel         Hemoglobin A1c         Lipid panel         TSH         POCT Urine Drug Screen - (I-Screen)         POC SARS Coronavirus 2 Ag      Meds ordered this encounter  Medications   acetaminophen (TYLENOL) tablet 650 mg   alum & mag hydroxide-simeth (MAALOX/MYLANTA) 200-200-20 MG/5ML suspension 30 mL   magnesium hydroxide (MILK  OF MAGNESIA) suspension 30 mL   AND Linked Order Group    OLANZapine zydis (ZYPREXA) disintegrating tablet 10 mg    LORazepam (ATIVAN) tablet 1 mg    ziprasidone (GEODON) injection 20 mg   temazepam (RESTORIL) capsule 30 mg   melatonin tablet 3 mg   gabapentin (NEURONTIN) capsule 400 mg   benztropine (COGENTIN) tablet 1 mg   ARIPiprazole ER (ABILIFY MAINTENA) injection 400 mg   haloperidol (HALDOL) tablet 20 mg     Recommendations  Based on my evaluation the patient appears to have an emergency medical condition for which I recommend the patient be transferred to the emergency department for further evaluation.  Evette Georges, NP 10/29/22  5:03 AM

## 2022-10-28 NOTE — ED Triage Notes (Signed)
Pt presents to [facility] voluntarily, accompanied by ACTT team staff with complaint of depression and anxiety. Pt thought process is scattered, trailing from one topic from another, and appears to be paranoid. Pt observed scanning the room and speaking of "they". Pt stated " they keep clocking me". Pt reports using marijuana tonight (unsure of amount). Pt has history of schizophrenia and denies taking medications at this time. Pt endorses AVH, but could not describe what he is experiencing. Pt currently denies SI, HI.

## 2022-10-28 NOTE — ED Notes (Signed)
If Beymer, is determined that he will not stay and be discharged, [facility] need to be contacted to pick him back up. [phone]

## 2022-10-29 ENCOUNTER — Other Ambulatory Visit: Payer: Self-pay

## 2022-10-29 DIAGNOSIS — F209 Schizophrenia, unspecified: Secondary | ICD-10-CM | POA: Diagnosis not present

## 2022-10-29 DIAGNOSIS — R410 Disorientation, unspecified: Secondary | ICD-10-CM | POA: Diagnosis not present

## 2022-10-29 DIAGNOSIS — F1210 Cannabis abuse, uncomplicated: Secondary | ICD-10-CM

## 2022-10-29 DIAGNOSIS — Z5900 Homelessness unspecified: Secondary | ICD-10-CM | POA: Diagnosis not present

## 2022-10-29 DIAGNOSIS — Z1152 Encounter for screening for COVID-19: Secondary | ICD-10-CM | POA: Diagnosis not present

## 2022-10-29 LAB — CBC WITH DIFFERENTIAL/PLATELET
Abs Immature Granulocytes: 0.02 10*3/uL (ref 0.00–0.07)
Basophils Absolute: 0.1 10*3/uL (ref 0.0–0.1)
Basophils Relative: 1 %
Eosinophils Absolute: 0 10*3/uL (ref 0.0–0.5)
Eosinophils Relative: 1 %
HCT: 45.4 % (ref 39.0–52.0)
Hemoglobin: 16 g/dL (ref 13.0–17.0)
Immature Granulocytes: 0 %
Lymphocytes Relative: 26 %
Lymphs Abs: 1.9 10*3/uL (ref 0.7–4.0)
MCH: 31.2 pg (ref 26.0–34.0)
MCHC: 35.2 g/dL (ref 30.0–36.0)
MCV: 88.5 fL (ref 80.0–100.0)
Monocytes Absolute: 0.5 10*3/uL (ref 0.1–1.0)
Monocytes Relative: 6 %
Neutro Abs: 4.9 10*3/uL (ref 1.7–7.7)
Neutrophils Relative %: 66 %
Platelets: 214 10*3/uL (ref 150–400)
RBC: 5.13 MIL/uL (ref 4.22–5.81)
RDW: 13.6 % (ref 11.5–15.5)
WBC: 7.4 10*3/uL (ref 4.0–10.5)
nRBC: 0 % (ref 0.0–0.2)

## 2022-10-29 LAB — COMPREHENSIVE METABOLIC PANEL
ALT: 21 U/L (ref 0–44)
AST: 20 U/L (ref 15–41)
Albumin: 4.1 g/dL (ref 3.5–5.0)
Alkaline Phosphatase: 54 U/L (ref 38–126)
Anion gap: 11 (ref 5–15)
BUN: 10 mg/dL (ref 6–20)
CO2: 25 mmol/L (ref 22–32)
Calcium: 9.6 mg/dL (ref 8.9–10.3)
Chloride: 103 mmol/L (ref 98–111)
Creatinine, Ser: 1.33 mg/dL — ABNORMAL HIGH (ref 0.61–1.24)
GFR, Estimated: >60 mL/min (ref >60)
Glucose, Bld: 81 mg/dL (ref 70–99)
Potassium: 3.8 mmol/L (ref 3.5–5.1)
Sodium: 139 mmol/L (ref 135–145)
Total Bilirubin: 1.4 mg/dL — ABNORMAL HIGH (ref 0.3–1.2)
Total Protein: 7.1 g/dL (ref 6.5–8.1)

## 2022-10-29 LAB — LIPID PANEL
Cholesterol: 151 mg/dL (ref 0–200)
HDL: 56 mg/dL (ref >40)
LDL Cholesterol: 86 mg/dL (ref 0–99)
Total CHOL/HDL Ratio: 2.7 RATIO
Triglycerides: 45 mg/dL (ref <150)
VLDL: 9 mg/dL (ref 0–40)

## 2022-10-29 LAB — TSH: TSH: 2.378 u[IU]/mL (ref 0.350–4.500)

## 2022-10-29 LAB — SARS CORONAVIRUS 2 BY RT PCR: SARS Coronavirus 2 by RT PCR: NEGATIVE

## 2022-10-29 LAB — POC SARS CORONAVIRUS 2 AG: SARSCOV2ONAVIRUS 2 AG: NEGATIVE

## 2022-10-29 NOTE — ED Notes (Signed)
Pt resting quietly.  Breathing even and unlabored. He is in view of nursing station and no distress noted at this time.

## 2022-10-29 NOTE — ED Notes (Signed)
Pt sleeping at present, no distress noted.  Monitoring for safety. 

## 2022-10-29 NOTE — ED Provider Notes (Signed)
FBC/OBS ASAP Discharge Summary  Date and Time: 10/29/2022 2:24 PM  Name: Carlos [name]  MRN:  [id number]   Discharge Diagnoses:  Final diagnoses:  Marijuana abuse  Schizophrenia, unspecified type ([place])  Homelessness   Subjective:  On reassessment, pt is sleeping, wakens to name being called. He keeps eyes closed for most of the assessment and speaks in a slow, low voice. When asked reason for coming to this facility, he reports "anxiety, depression". Reports he is current experiencing visual hallucinations of "animals, people". He denies auditory hallucinations. He denies suicidal, homicidal or violent ideations. Pt confirms he came to facility with his ACT team staff, he states he believes it was [name]. He gives verbal consent to speak with ACT team. He states ACT team is with [facility]. Possible paranoia/delusions, states his wife is "picking" at him. He feels his wife may be working with the people who live below them to pick on him, such as by cutting his braids.   Attempted several times to get in contact with ACT team staff at number provided, [phone]. Informed by staff that call was returned by ACT team later in the day - Tracey, [phone]. Linus Orn confirms pt is with [facility] ACT team. She confirms pt was dropped off to this facility by ACT team. She states she is not sure of the details although was told pt was "very delusional" and there were "family issues".   Pt was accepted to [facility]. Informed by staff pt does not want to go. Spoke w/ pt about his concerns. We discussed the concerns of ACT staff and the benefits of inpatient psychiatric hospitalization. He can continue to work with his ACT team after discharge. Pt verbalized understanding and agreed to [facility] to [facility].  Throughout my assessment, there has been no evidence of agitation or aggression. Pt has been cooperative and pleasant.   Stay  Summary:  Pt is a 32 y/o male w/ history of depression, psychotic disorder, ptsd, polysubstance abuse, schizophrenia presenting to [facility] on 10/28/22 with ACT staff. On admission, pt was documented to be a poor historian, appearing scattered, paranoid, and reporting seeing animals in boxes, also reported using marijuana daily, last use was prior to coming to facility. On assessment today, pt continues to report visual hallucinations and endorse possible paranoia/delusions. ACT team staff report pt was "very delusional" and there were "family issues" yesterday leading to presentation. Pt recommended for inpatient psychiatric admission and has been accepted to [facility].  Total Time spent with patient: 30 minutes  Past Psychiatric History: Per chart review, history of depression, psychotic disorder, ptsd, polysubstance abuse, schizophrenia Past Medical History: None reported Family History: None reported Family Psychiatric History: None reported Social History: Per pt and ACT staff report pt is married Tobacco Cessation:  Prescription not provided because: inpatient psychiatric hospitalization  Current Medications:  Current Facility-Administered Medications  Medication Dose Route Frequency Provider Last Rate Last Admin   acetaminophen (TYLENOL) tablet 650 mg  650 mg Oral Q6H PRN Evette Georges, NP       alum & mag hydroxide-simeth (MAALOX/MYLANTA) 200-200-20 MG/5ML suspension 30 mL  30 mL Oral Q4H PRN Evette Georges, NP       ARIPiprazole ER (ABILIFY MAINTENA) injection 400 mg  400 mg Intramuscular Q28 days Evette Georges, NP       benztropine (COGENTIN) tablet 1 mg  1 mg Oral BID Evette Georges, NP   1 mg at 10/29/22 [phone]  gabapentin (NEURONTIN) capsule 400 mg  400 mg Oral TID Evette Georges, NP       haloperidol (HALDOL) tablet 20 mg  20 mg Oral QHS Evette Georges, NP   20 mg at 10/29/22 0059   magnesium hydroxide (MILK OF MAGNESIA) suspension 30 mL  30 mL Oral Daily PRN Evette Georges, NP        melatonin tablet 3 mg  3 mg Oral QHS Evette Georges, NP   3 mg at 10/29/22 0059   OLANZapine zydis (ZYPREXA) disintegrating tablet 10 mg  10 mg Oral Q8H PRN Evette Georges, NP       And   ziprasidone (GEODON) injection 20 mg  20 mg Intramuscular PRN Evette Georges, NP       temazepam (RESTORIL) capsule 30 mg  30 mg Oral QHS PRN Evette Georges, NP   30 mg at 10/29/22 0059   Current Outpatient Medications  Medication Sig Dispense Refill   ARIPiprazole ER (ABILIFY MAINTENA) 400 MG SRER injection Inject 2 mLs (400 mg total) into the muscle every 28 (twenty-eight) days. Due next 10/14 (Patient not taking: Reported on 10/29/2022) 1 each 11   benztropine (COGENTIN) 1 MG tablet Take 1 tablet (1 mg total) by mouth 2 (two) times daily. (Patient not taking: Reported on 10/29/2022) 60 tablet 2   gabapentin (NEURONTIN) 400 MG capsule Take 1 capsule (400 mg total) by mouth 3 (three) times daily. (Patient not taking: Reported on 10/29/2022) 90 capsule 2   haloperidol (HALDOL) 20 MG tablet Take 1 tablet (20 mg total) by mouth at bedtime. (Patient not taking: Reported on 10/29/2022) 90 tablet 1   Melatonin 3 MG TABS Take 3 mg by mouth at bedtime. (Patient not taking: Reported on 10/29/2022)     temazepam (RESTORIL) 30 MG capsule Take 1 capsule (30 mg total) by mouth at bedtime as needed for sleep. (Patient not taking: Reported on 10/29/2022) 30 capsule 0    PTA Medications:  Facility Ordered Medications  Medication   acetaminophen (TYLENOL) tablet 650 mg   alum & mag hydroxide-simeth (MAALOX/MYLANTA) 200-200-20 MG/5ML suspension 30 mL   magnesium hydroxide (MILK OF MAGNESIA) suspension 30 mL   OLANZapine zydis (ZYPREXA) disintegrating tablet 10 mg   And   [COMPLETED] LORazepam (ATIVAN) tablet 1 mg   And   ziprasidone (GEODON) injection 20 mg   temazepam (RESTORIL) capsule 30 mg   melatonin tablet 3 mg   gabapentin (NEURONTIN) capsule 400 mg   benztropine (COGENTIN) tablet 1 mg   ARIPiprazole ER (ABILIFY  MAINTENA) injection 400 mg   haloperidol (HALDOL) tablet 20 mg   PTA Medications  Medication Sig   Melatonin 3 MG TABS Take 3 mg by mouth at bedtime. (Patient not taking: Reported on 10/29/2022)   ARIPiprazole ER (ABILIFY MAINTENA) 400 MG SRER injection Inject 2 mLs (400 mg total) into the muscle every 28 (twenty-eight) days. Due next 10/14 (Patient not taking: Reported on 10/29/2022)   haloperidol (HALDOL) 20 MG tablet Take 1 tablet (20 mg total) by mouth at bedtime. (Patient not taking: Reported on 10/29/2022)   temazepam (RESTORIL) 30 MG capsule Take 1 capsule (30 mg total) by mouth at bedtime as needed for sleep. (Patient not taking: Reported on 10/29/2022)   benztropine (COGENTIN) 1 MG tablet Take 1 tablet (1 mg total) by mouth 2 (two) times daily. (Patient not taking: Reported on 10/29/2022)   gabapentin (NEURONTIN) 400 MG capsule Take 1 capsule (400 mg total) by mouth 3 (three) times daily. (Patient not taking: Reported on  10/29/2022)        No data to display          [facility] from [facility]/20[facility] from 01/25/2022 in [facility] Emergency Department at [facility] ED from 10/03/2021 in [facility] Emergency Department at [place] No Risk No Risk No Risk       Musculoskeletal  Strength & Muscle Tone: within normal limits Gait & Station: normal Patient leans: N/A  Psychiatric Specialty Exam  Presentation  General Appearance:  Appropriate for Environment; Other (comment) (dressed in hospital scrubs)  Eye Contact: Minimal  Speech: Other (comment); Slow (mumbling)  Speech Volume: Decreased  Handedness: Right   Mood and Affect  Mood: Anxious; Depressed  Affect: Flat   Thought Process  Thought Processes: Coherent  Descriptions of Associations:Circumstantial  Orientation:Full (Time, Place and Person)  Thought Content:Other (comment); Logical (circumstantial)  Diagnosis of  Schizophrenia or Schizoaffective disorder in past: Yes  Duration of Psychotic Symptoms: Greater than six months   Hallucinations:Hallucinations: Visual Description of Visual Hallucinations: "animals, people"  Ideas of Reference:None  Suicidal Thoughts:Suicidal Thoughts: No  Homicidal Thoughts:Homicidal Thoughts: No   Sensorium  Memory: Immediate Fair  Judgment: Poor  Insight: Poor   Executive Functions  Concentration: Fair  Attention Span: Fair  Recall: [place] of Knowledge: Fair  Language: Fair   Psychomotor Activity  Psychomotor Activity: Psychomotor Activity: Normal   Assets  Assets: Communication Skills; Desire for Improvement; Financial Resources/Insurance; Physical Health; Resilience   Sleep  Sleep: Sleep: Fair   Nutritional Assessment (For OBS and FBC admissions only) Has the patient had a weight loss or gain of 10 pounds or more in the last 3 months?: No Has the patient had a decrease in food intake/or appetite?: No Does the patient have dental problems?: No Does the patient have eating habits or behaviors that may be indicators of an eating disorder including binging or inducing vomiting?: No Has the patient recently lost weight without trying?: 0 Has the patient been eating poorly because of a decreased appetite?: 0 Malnutrition Screening Tool Score: 0    Physical Exam  Physical Exam Constitutional:      General: He is not in acute distress.    Appearance: He is not ill-appearing, toxic-appearing or diaphoretic.  Eyes:     General: No scleral icterus. Cardiovascular:     Rate and Rhythm: Normal rate.  Pulmonary:     Effort: Pulmonary effort is normal. No respiratory distress.  Skin:    General: Skin is warm and dry.  Neurological:     Mental Status: He is alert and oriented to person, place, and time.  Psychiatric:        Attention and Perception: Attention normal. He perceives visual hallucinations.        Mood and  Affect: Mood is anxious and depressed. Affect is flat.        Behavior: Behavior is slowed. Behavior is cooperative.        Thought Content: Thought content is paranoid and delusional. Thought content does not include homicidal or suicidal ideation. Thought content does not include homicidal or suicidal plan.    Review of Systems  Constitutional:  Negative for chills and fever.  Respiratory:  Negative for shortness of breath.   Cardiovascular:  Negative for chest pain and palpitations.  Gastrointestinal:  Negative for abdominal pain.  Neurological:  Negative for headaches.  Psychiatric/Behavioral:  Positive for depression and hallucinations. The  patient is nervous/anxious.    Blood pressure 99/70, pulse 73, temperature 97.7 F (36.5 C), resp. rate 18, SpO2 99 %. There is no height or weight on file to calculate BMI.  Demographic Factors:  Male and Low socioeconomic status  Loss Factors: NA  Historical Factors: NA  Risk Reduction Factors:   Living with another person, especially a relative and Positive social support  Continued Clinical Symptoms:  Previous Psychiatric Diagnoses and Treatments  Cognitive Features That Contribute To Risk:  None    Suicide Risk:  Minimal: No identifiable suicidal ideation.  Patients presenting with no risk factors but with morbid ruminations; may be classified as minimal risk based on the severity of the depressive symptoms  Plan Of Care/Follow-up recommendations:  Inpatient psychiatric hospitalization  Disposition:  Inpatient psychiatric hospitalization  Tharon Aquas, NP 10/29/2022, 2:24 PM

## 2022-10-29 NOTE — ED Notes (Signed)
Pt was informed that he had a bed at [facility] but stated that he did not want to go there  due to bad experiences.  Pt then asked to take a shower.   Hygiene supplies and change of clothes provided.   Currently taking a shower.    Provider has been known about pt's reluctance to go to [facility].
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
## 2022-10-29 NOTE — ED Notes (Signed)
Pt continues to rest quietly.  Breathing even and unlabored.

## 2022-10-29 NOTE — ED Notes (Signed)
Pt has cont to sleep soundly this morning.   He does wake up to verbal prompts and answers questions appropriately. BR offered.   Reports that he does not need to use the bathroom and would like to sleep a  little longer. Staff will cont to monitor for safety.

## 2022-10-29 NOTE — ED Notes (Signed)
Provider is currently assessing pt.

## 2022-10-29 NOTE — Progress Notes (Signed)
Inpatient Behavioral Health Placement  Pt meets inpatient criteria per Tharon Aquas, NP.  There are no available beds within the [facility] system per [place], Therapist, sports. Referral was sent to the following facilities;   Destination  Service Provider Address Phone Fax  [facility]  [address] [place] [address] [phone]  [facility]  [address], [place] [id number] [phone]  [facility] [place]  [address] [place], [place] [address] [employer]  [facility]  [address] [place] [address] [phone] [place]  [place], [place] [address] [phone]      [place]  [address] [place] [address] [place]  [facility]  [address] [place] [address] [phone]  [facility]  [address], [place] [address] [phone]  [place] [address] [place] [address] [id number]  [facility]  [place] [address] [phone] [place]  [address], [place] [address] [phone]  [facility]  [address] [place] [address] [place]  [facility]  [address], [place] [address] [phone]    Situation ongoing,  CSW will follow up.   Benjaman Kindler, MSW, [facility] 10/29/2022  @ 12:05 PM

## 2022-10-29 NOTE — ED Notes (Signed)
Pt awake at this time. He is being given breakfast muffin and juice.  Awaiting provider's dispo.

## 2022-10-29 NOTE — ED Notes (Signed)
Spoke with [facility] and pt accepted to [facility].

## 2022-10-29 NOTE — Progress Notes (Signed)
Pt was accepted to [facility] 10/29/2022. Bed assignment: [place]  Pt meets inpatient criteria per Elvin So, NP  Attending Physician will be Dr. Alcide Clever, MD  Report can be called to: [phone]  Bed is ready now  Care Team Notified: Drema Halon, RN, Elvin So, NP, [facility], RN, and [address], [place], [place]  10/29/2022 12:56 PM

## 2022-10-29 NOTE — ED Notes (Signed)
Pt sleeping at this time.

## 2022-10-29 NOTE — ED Notes (Signed)
Report called to [place] at [facility].  Verbalized understanding.
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
## 2022-10-29 NOTE — ED Notes (Signed)
Pt continues to rest quietly,  breathing even and unlabored.

## 2022-10-29 NOTE — ED Notes (Signed)
Pt A&O x 4, presents with paranoia, depression, anxiety with flight of ideas.   Pt guarded, suspicious and cooperative. Comfort measures given.  Denies SI, HI or AVH.  Monitoring for safety.

## 2022-10-30 LAB — HEMOGLOBIN A1C
Hgb A1c MFr Bld: 5.3 % (ref 4.8–5.6)
Mean Plasma Glucose: 105 mg/dL

## 2022-12-18 ENCOUNTER — Ambulatory Visit (HOSPITAL_COMMUNITY)
Admission: EM | Admit: 2022-12-18 | Discharge: 2022-12-18 | Disposition: A | Discharge status: Home / Self Care | Payer: 59 | Attending: Registered Nurse | Admitting: Registered Nurse

## 2022-12-18 DIAGNOSIS — Z5900 Homelessness unspecified: Secondary | ICD-10-CM | POA: Diagnosis not present

## 2022-12-18 DIAGNOSIS — F1220 Cannabis dependence, uncomplicated: Secondary | ICD-10-CM | POA: Insufficient documentation

## 2022-12-18 DIAGNOSIS — F209 Schizophrenia, unspecified: Secondary | ICD-10-CM

## 2022-12-18 DIAGNOSIS — F1994 Other psychoactive substance use, unspecified with psychoactive substance-induced mood disorder: Secondary | ICD-10-CM | POA: Diagnosis present

## 2022-12-18 DIAGNOSIS — F17210 Nicotine dependence, cigarettes, uncomplicated: Secondary | ICD-10-CM | POA: Insufficient documentation
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
## 2022-12-18 NOTE — Discharge Instructions (Signed)
[facility] Address: [address] [place], [place], [place] [address] Phone: [phone]  Supported Employment The supported employment program is a person-centered, individualized, evidence-based support service that helps members choose, acquire, and maintain competitive employment in our community. This service supports the varying needs of individuals and promotes community inclusion and employment success. Members enrolled in the supported employment program can expect the following:  Development of an individual career plan Community based job placement Job shadowing Job development On-site job Furniture conservator/restorer and support  Supported Education Supported education helps our members receive the education and training they need to achieve their learning and recovery goals. This will assist members with becoming gainfully employed in the job or career of their choice. The program includes assistance with: Registering for disability accommodations Enrolling in school and registering for classes Learning communication skills Scheduling tutoring sessions within your school [facility] [facility] partners with Vocational Rehabilitation to help increase the success of clients seeking employment and educational goals.  Want to learn more about our programs?   Please contact our intake department INTAKE: [phone] Ext [phone]  Mailing: [address] [address]   [place], [place] [address]   [email or web address]   Interactive Resource Center  Hours Monday - Friday: Services: 8:00AM - 3:00PM Offices: 8:00AM - 5:00PM  Physical Address [address] [place], [place] [address]   Please use this address for [facility] Mailing Address [address] [address] [place], [place] [address]  The [facility] helps people reconnect This is a safe place to rest, take care of basic needs and access the services and community that make all the difference. Our guests come to the [facility] to take a class, do laundry, meet with a case  manager or to get their mail. Sometimes they just need to sit in our dayroom and enjoy a conversation.  Here you will find everything from shower facilities to a computer lab, a mail room, classrooms and meeting spaces.  The IRC helps people reconnect with their own lives and with the community at large.  A caring community setting One of the most exciting aspects of the IRC is that so many individuals and organizations in the community are a part of the everyday experience. Whether it's a hair stylist or law firm offering services right in-house, our partners make the [facility] a truly interactive resource center where services are brought to our guests. The IRC brings together a comprehensive community of talented people who not only want to help solve problems, but also to be a part of our guests' lives.  Integrated Care We take a person-centered approach to assistance that includes: Case management Geneticist, molecular Medical clinic Mental health nurse Referrals  Fundamental Services We start with necessities: Midwife and Armed forces operational officer addresses and mailboxes Replacement IDs Onsite barbershop Storage lockers [place] winter warming center  Self-Sufficiency We connect our guests with: Skilled trade classes Job skills classes Resume and jobs application assistance Interview training GED Academic librarian
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
## 2023-01-05 ENCOUNTER — Emergency Department (HOSPITAL_COMMUNITY)
Admission: EM | Admit: 2023-01-05 | Discharge: 2023-01-05 | Disposition: A | Discharge status: Home / Self Care | Payer: 59 | Attending: Emergency Medicine | Admitting: Emergency Medicine

## 2023-01-05 DIAGNOSIS — L723 Sebaceous cyst: Secondary | ICD-10-CM | POA: Diagnosis not present

## 2023-01-05 DIAGNOSIS — L089 Local infection of the skin and subcutaneous tissue, unspecified: Secondary | ICD-10-CM

## 2023-01-05 DIAGNOSIS — H9201 Otalgia, right ear: Secondary | ICD-10-CM | POA: Diagnosis present

## 2023-01-05 MED ORDER — IBUPROFEN 800 MG PO TABS
800.0000 mg | ORAL_TABLET | Freq: Once | ORAL | Status: AC
  Administered 2023-01-05: 800 mg via ORAL
  Filled 2023-01-05: qty 1

## 2023-01-05 MED ORDER — DOXYCYCLINE HYCLATE 100 MG PO TABS
100.0000 mg | ORAL_TABLET | Freq: Once | ORAL | Status: AC
  Administered 2023-01-05: 100 mg via ORAL
  Filled 2023-01-05: qty 1

## 2023-01-05 MED ORDER — LIDOCAINE-EPINEPHRINE (PF) 2 %-1:200000 IJ SOLN
10.0000 mL | Freq: Once | INTRAMUSCULAR | Status: AC
  Administered 2023-01-05: 10 mL
  Filled 2023-01-05: qty 20

## 2023-01-05 MED ORDER — DOXYCYCLINE HYCLATE 100 MG PO CAPS: 100.0000 mg | ORAL_CAPSULE | Freq: Two times a day (BID) | ORAL | 0 refills | Status: DC

## 2023-01-05 NOTE — Discharge Instructions (Signed)
We drained the cyst and infection today.  It should rapidly get better over the next day or 2.  You are also given a prescription for antibiotics.  You had your first 1 here so do not need another 1 until you go to bed tonight.

## 2023-01-05 NOTE — ED Triage Notes (Signed)
Pt reports he believes he may have an insect in his right ear. Pt states he may have been bitten by it as well. There is some swelling and pain to right outer ear.

## 2023-01-05 NOTE — ED Provider Notes (Signed)
[place] EMERGENCY DEPARTMENT AT [facility] Provider Note   CSN: [id number] Arrival date & time: 01/05/23  1102     History  Chief Complaint  Patient presents with   Foreign Body in Ear    Carlos [name] is a 32 y.o. male.  Patient is a 32 year old male with a history of mental illness who is presenting today with a 2-day history of worsening pain in his right ear.  There has been swelling and it feels like there might be something in his ear.  He denies finding anything in his ear or killing any bugs that might of bit him.  He has no other complaints.  The history is provided by the patient.  Foreign Body in Ear This is a new problem.       Home Medications Prior to Admission medications   Medication Sig Start Date End Date Taking? Authorizing Provider  doxycycline (VIBRAMYCIN) 100 MG capsule Take 1 capsule (100 mg total) by mouth 2 (two) times daily. 01/05/23  Yes Gwyneth Sprout, MD  ARIPiprazole ER (ABILIFY MAINTENA) 400 MG SRER injection Inject 2 mLs (400 mg total) into the muscle every 28 (twenty-eight) days. Due next 10/14 Patient not taking: Reported on 10/29/2022 05/15/19   Malvin Johns, MD  benztropine (COGENTIN) 1 MG tablet Take 1 tablet (1 mg total) by mouth 2 (two) times daily. Patient not taking: Reported on 10/29/2022 04/18/19   Malvin Johns, MD  gabapentin (NEURONTIN) 400 MG capsule Take 1 capsule (400 mg total) by mouth 3 (three) times daily. Patient not taking: Reported on 10/29/2022 04/18/19   Malvin Johns, MD  haloperidol (HALDOL) 20 MG tablet Take 1 tablet (20 mg total) by mouth at bedtime. Patient not taking: Reported on 10/29/2022 04/18/19   Malvin Johns, MD  Melatonin 3 MG TABS Take 3 mg by mouth at bedtime. Patient not taking: Reported on 10/29/2022    [provider]  temazepam (RESTORIL) 30 MG capsule Take 1 capsule (30 mg total) by mouth at bedtime as needed for sleep. Patient not taking: Reported on 10/29/2022 04/18/19   Malvin Johns, MD      Allergies    Depakote [divalproex sodium], [place] er], and Other    Review of Systems   Review of Systems  Physical Exam Updated Vital Signs BP 123/83   Pulse 66   Temp 98.1 F (36.7 C) (Oral)   Resp 18   Ht 6\' 1"  (1.854 m)   Wt 80.7 kg   SpO2 94%   BMI 23.48 kg/m  Physical Exam Vitals and nursing note reviewed.  HENT:     Head: Normocephalic.     Right Ear: Tympanic membrane normal.     Ears:      Mouth/Throat:     Comments: Dentition normal Cardiovascular:     Pulses: Normal pulses.  Pulmonary:     Effort: Pulmonary effort is normal.  Neurological:     Mental Status: He is alert. Mental status is at baseline.  Psychiatric:        Behavior: Behavior normal.     ED Results / Procedures / Treatments   Labs (all labs ordered are listed, but only abnormal results are displayed) Labs Reviewed - No data to display  EKG None  Radiology No results found.  Procedures Procedures    INCISION AND DRAINAGE Performed by: Gwyneth Sprout Consent: Verbal consent obtained. Risks and benefits: risks, benefits and alternatives were discussed Type: abscess  Body area: right periauricular area  Anesthesia: local infiltration  Incision was made with a scalpel.  Local anesthetic: lidocaine 2% with epinephrine  Anesthetic total: 1 ml  Complexity: complex Blunt dissection to break up loculations  Drainage: purulent  Drainage amount: 5mL  Packing material: none Patient tolerance: Patient tolerated the procedure well with no immediate complications.    Medications Ordered in ED Medications  ibuprofen (ADVIL) tablet 800 mg (has no administration in time range)  doxycycline (VIBRA-TABS) tablet 100 mg (has no administration in time range)  lidocaine-EPINEPHrine (XYLOCAINE W/EPI) 2 %-1:200000 (PF) injection 10 mL (10 mLs Infiltration Given by Other 01/05/23 1138)    ED Course/ Medical Decision Making/ A&P                              Medical Decision Making Risk Prescription drug management.   Patient here with appears to be an abscess in the periauricular area.  I&D as above.  Patient started on doxycycline.  Stable for discharge home.  No evidence of foreign body in the ear at this time.        Final Clinical Impression(s) / ED Diagnoses Final diagnoses:  Infected sebaceous cyst of skin    Rx / DC Orders ED Discharge Orders          Ordered    doxycycline (VIBRAMYCIN) 100 MG capsule  2 times daily        01/05/23 1227              Gwyneth Sprout, MD 01/05/23 1228

## 2023-01-06 ENCOUNTER — Ambulatory Visit (HOSPITAL_COMMUNITY)
Admission: EM | Admit: 2023-01-06 | Discharge: 2023-01-07 | Disposition: A | Discharge status: Home / Self Care | Payer: 59 | Attending: Nurse Practitioner | Admitting: Nurse Practitioner

## 2023-01-06 DIAGNOSIS — F4310 Post-traumatic stress disorder, unspecified: Secondary | ICD-10-CM | POA: Insufficient documentation

## 2023-01-06 DIAGNOSIS — R441 Visual hallucinations: Secondary | ICD-10-CM

## 2023-01-06 DIAGNOSIS — F209 Schizophrenia, unspecified: Secondary | ICD-10-CM | POA: Insufficient documentation

## 2023-01-06 DIAGNOSIS — Z5900 Homelessness unspecified: Secondary | ICD-10-CM

## 2023-01-06 NOTE — Progress Notes (Signed)
   01/06/23 2312  [facility] Triage Screening (Walk-ins at [facility] only)  How Did You Hear About [place]? Self  What Is the Reason for Your Visit/Call Today? Pt presents to [facility] due to experiencing homelessness, PTSD, and hallucinations. Pt reports seeing glowing lights, spirits and dead people. Pt is having difficulty focusing on questions posed. Pt appears tired and rambling about various topics.Pt has history significant for Schizoaffective disorder and is followed by ACT team, [facility]. Pt prescribed Trazadone, Cogentin and Risperdal. Pt is not compliant at the moment. Pt denies SI,HI, AH.  How Long Has This Been Causing You Problems? <Week  Have You Recently Had Any Thoughts About Hurting Yourself? No  Are You Planning to Commit Suicide/Harm Yourself At This time? No  Have you Recently Had Thoughts About Hurting Someone Karolee Ohs? No  Are You Planning To Harm Someone At This Time? No  Are you currently experiencing any auditory, visual or other hallucinations? Yes  Please explain the hallucinations you are currently experiencing: Pt reports seeing glowing lights, spirits  Have You Used Any Alcohol or Drugs in the Past 24 Hours? Yes  How long ago did you use Drugs or Alcohol? today  What Did You Use and How Much? marijuana (vape/thc pen)  Do you have any current medical co-morbidities that require immediate attention? No  Clinician description of patient physical appearance/behavior: cooperative but, difficulty focusing on questions posed  What Do You Feel Would Help You the Most Today? Housing Assistance;Social Support;Treatment for Depression or other mood problem  If access to [facility] was not available, would you have sought care in the Emergency Department? No  Determination of Need Routine (7 days)  Options For Referral Other: Comment;Outpatient Therapy;Medication Management

## 2023-01-07 DIAGNOSIS — R441 Visual hallucinations: Secondary | ICD-10-CM | POA: Diagnosis not present

## 2023-01-07 DIAGNOSIS — F4310 Post-traumatic stress disorder, unspecified: Secondary | ICD-10-CM | POA: Diagnosis not present

## 2023-01-07 DIAGNOSIS — F209 Schizophrenia, unspecified: Secondary | ICD-10-CM | POA: Diagnosis not present

## 2023-01-07 DIAGNOSIS — Z5900 Homelessness unspecified: Secondary | ICD-10-CM | POA: Diagnosis not present

## 2023-01-07 NOTE — ED Notes (Signed)
Patient supplied with AVS. Discharge instructions and follow up instructions supplied and explained to patient. Patient verbalized understanding and denied further questions. Patient was escorted out of building without incident. 

## 2023-01-07 NOTE — Discharge Instructions (Addendum)

## 2023-01-07 NOTE — ED Provider Notes (Signed)
Behavioral Health Urgent Care Medical Screening Exam  Patient Name: Carlos [name] MRN: [id number] Date of Evaluation: 01/07/23 Chief Complaint:  " Patient reports "same thing as last time".  Diagnosis:  Final diagnoses:  Homelessness  Hallucinations, visual    History of Present illness: Carlos [name] is a 32 y.o. male. with psychiatric history of depression, psychotic disorder, PTSD, schizophrenia, substance-induced mood disorder, tobacco and cannabis induced mood disorder and homelessness, who presented voluntarily as a walk-in to [facility] with complaints of homelessness and visual hallucinations.  Patient was seen face-to-face for this provider and chart reviewed. Patient has had 4 ED/UC visits in the last 6 months and was recently seen at the [facility] 10/28/22-10/29/22, 12/18/22. [facility] 01/05/23, and back to the [facility] 01/06/23.  On approach, patient observed resting his head on the table in assessment room sleeping. Patient aroused when his name was called.   Patient appears uninterested in evaluation, reporting he is very sleepy and would nod off mid-conversation, while asking provider to repeat the questions.   When asked what brings him in, Patient reports "same thing as last time, I've been seeing the dead, dead people and I have nowhere to stay. Patient is unsure about the timeline of his visual hallucinations, shrugs his shoulders and declines to elaborate.   Patient denies substance use.  Patient denies SI, denies HI, denies AH.  Patient reports he is currently homeless since the beginning of the year. Patient denies access to a gun or weapon.  Patient reports he has a diagnosis of PTSD, and is unable to remember his other diagnoses.  Patient reports he is prescribed trazodone, Risperdal, and Cogentin.  Patient reports he is unsure when he last took his medications.  He reports he has an ACT team- [facility] and last spoke to his contact person, Carlos. Carlos [name] yesterday about  his living situation and what he's been currently seeing. Patient reports he is unable to remember Carlos [name] phone number right now.  Support, encouragement and reassurance provided about ongoing stressors.   Discussed recommendation for discharge and follow up with his ACT Team or [facility] OP walk in clinic for medication management.  Patient is provided with opportunity or questions.   Patient reports he is more interested in getting a place to stay, and states "I've been to every homeless shelter here, and I was shipped to [place] and back and there's no where for me to go or stay, I don't have a place to stay right now, and I'm tired of this shit, so where am I gonna go?". Patient advised [facility] is not a homeless shelter and he will be provided resources for area homeless shelters.  On evaluation, patient is alert, oriented x 3, and cooperative. Speech is clear and coherent. Pt appears casual. Eye contact is fair. Mood is euthymic, affect is congruent with mood. Thought process is goal directed and thought content is WDL. Pt denies SI/HI/AH. There is no objective indication that the patient is responding to internal stimuli. No delusions elicited during this assessment.    Flowsheet Row ED from 01/06/2023 in [facility] ED from 01/05/2023 in [facility] Emergency Department at [facility] ED from 12/18/2022 in [facility]  C-SSRS RISK CATEGORY No Risk No Risk No Risk       Psychiatric Specialty Exam  Presentation  General Appearance:Casual  Eye Contact:Fair  Speech:Clear and Coherent  Speech Volume:Normal  Handedness:Right   Mood and Affect  Mood: Euthymic  Affect: Congruent   Thought Process  Thought Processes: Goal Directed; Coherent  Descriptions of Associations:Intact  Orientation:Full (Time, Place and Person)  Thought Content:WDL  Diagnosis of Schizophrenia or Schizoaffective disorder in past:  Yes  Duration of Psychotic Symptoms: Greater than six months  Hallucinations:Visual Patient reports seeing dead people  Ideas of Reference:None  Suicidal Thoughts:No  Homicidal Thoughts:No   Sensorium  Memory: Immediate Fair  Judgment: Fair  Insight: Present   Executive Functions  Concentration: Fair  Attention Span: Fair  Recall: [employer] of Knowledge: Fair  Language: Fair   Psychomotor Activity  Psychomotor Activity: Normal   Assets  Assets: Communication Skills; Desire for Improvement; Social Support   Sleep  Sleep: Fair  Number of hours: No data recorded  Physical Exam: Physical Exam Constitutional:      General: He is not in acute distress.    Appearance: He is not diaphoretic.  HENT:     Head: Normocephalic.     Right Ear: External ear normal.     Left Ear: External ear normal.     Nose: No congestion.  Eyes:     General:        Right eye: No discharge.        Left eye: No discharge.  Cardiovascular:     Rate and Rhythm: Normal rate.  Pulmonary:     Effort: No respiratory distress.  Chest:     Chest wall: No tenderness.  Neurological:     Mental Status: He is alert and oriented to person, place, and time.  Psychiatric:        Attention and Perception: He perceives visual hallucinations.        Mood and Affect: Mood and affect normal.        Speech: Speech normal.        Behavior: Behavior is cooperative.        Thought Content: Thought content normal. Thought content is not paranoid or delusional. Thought content does not include homicidal or suicidal ideation. Thought content does not include homicidal or suicidal plan.    Review of Systems  Constitutional:  Negative for chills, diaphoresis and fever.  HENT:  Negative for congestion.   Eyes:  Negative for discharge.  Respiratory:  Negative for cough, shortness of breath and wheezing.   Cardiovascular:  Negative for chest pain and palpitations.  Gastrointestinal:   Negative for diarrhea, nausea and vomiting.  Neurological:  Negative for dizziness, seizures, loss of consciousness, weakness and headaches.  Psychiatric/Behavioral:  Positive for hallucinations.    Blood pressure 105/67, pulse 60, temperature 98.2 F (36.8 C), temperature source Oral, resp. rate 20, SpO2 99 %. There is no height or weight on file to calculate BMI.  Musculoskeletal: Strength & Muscle Tone: within normal limits Gait & Station: normal Patient leans: N/A   [facility] MSE Discharge Disposition for Follow up and Recommendations: Based on my evaluation the patient does not appear to have an emergency medical condition and can be discharged with resources and follow up care in outpatient services for Medication Management  Patient denies SI, HI, AH.  Patient does not meet inpatient psychiatric admission criteria or IVC criteria at this time. There is no evidence of imminent risk of harm to self or others.    Recommend discharge and follow up with [facility] outpatient walk in clinic or [facility]/ACTT team. Recommend follow up with area homeless shelter resources.    Discharge recommendations:  Please follow up with your primary care provider for  all medical related needs.   Therapy: We recommend that patient participate in individual therapy to address mental health concerns.  Medications: The patient or guardian should update outpatient providers of any new medications and/or medication changes.   Safety:  The patient should abstain from use of illicit substances/drugs and abuse of any medications. If symptoms worsen or do not continue to improve or if the patient becomes actively suicidal or homicidal then it is recommended that the patient return to the closest hospital emergency department, the [facility], or call 911 for further evaluation and treatment. National Suicide Prevention Lifeline 1-800-SUICIDE or (586)863-5789.  About 988 988  offers 24/7 access to trained crisis counselors who can help people experiencing mental health-related distress. People can call or text 988 or chat 988lifeline.org for themselves or if they are worried about a loved one who may need crisis support.  Crisis Mobile: [facility]:                     (919)397-1298 (for crisis response 24 hours a day) [facility] Hotline:                                            (217)572-9354   Patient discharged and condition at discharge is stable.   Mattia Liford Royston Bake, NP 01/07/2023, 1:25 AM

## 2023-03-26 ENCOUNTER — Emergency Department (HOSPITAL_COMMUNITY)
Admission: EM | Admit: 2023-03-26 | Discharge: 2023-03-26 | Disposition: A | Discharge status: Home / Self Care | Payer: 59 | Attending: Emergency Medicine | Admitting: Emergency Medicine

## 2023-03-26 ENCOUNTER — Encounter (HOSPITAL_COMMUNITY): Payer: Self-pay | Admitting: Emergency Medicine

## 2023-03-26 ENCOUNTER — Other Ambulatory Visit: Payer: Self-pay

## 2023-03-26 DIAGNOSIS — F22 Delusional disorders: Secondary | ICD-10-CM

## 2023-03-26 DIAGNOSIS — F4310 Post-traumatic stress disorder, unspecified: Secondary | ICD-10-CM | POA: Diagnosis not present

## 2023-03-26 DIAGNOSIS — Z79899 Other long term (current) drug therapy: Secondary | ICD-10-CM | POA: Diagnosis not present

## 2023-03-26 LAB — CBC WITH DIFFERENTIAL/PLATELET
Abs Immature Granulocytes: 0.03 10*3/uL (ref 0.00–0.07)
Basophils Absolute: 0.1 10*3/uL (ref 0.0–0.1)
Basophils Relative: 1 %
Eosinophils Absolute: 0.2 10*3/uL (ref 0.0–0.5)
Eosinophils Relative: 2 %
HCT: 42.5 % (ref 39.0–52.0)
Hemoglobin: 14.2 g/dL (ref 13.0–17.0)
Immature Granulocytes: 0 %
Lymphocytes Relative: 22 %
Lymphs Abs: 1.7 10*3/uL (ref 0.7–4.0)
MCH: 30.3 pg (ref 26.0–34.0)
MCHC: 33.4 g/dL (ref 30.0–36.0)
MCV: 90.8 fL (ref 80.0–100.0)
Monocytes Absolute: 0.6 10*3/uL (ref 0.1–1.0)
Monocytes Relative: 7 %
Neutro Abs: 5.3 10*3/uL (ref 1.7–7.7)
Neutrophils Relative %: 68 %
Platelets: 215 10*3/uL (ref 150–400)
RBC: 4.68 MIL/uL (ref 4.22–5.81)
RDW: 13.2 % (ref 11.5–15.5)
WBC: 7.8 10*3/uL (ref 4.0–10.5)
nRBC: 0 % (ref 0.0–0.2)

## 2023-03-26 LAB — COMPREHENSIVE METABOLIC PANEL
ALT: 51 U/L — ABNORMAL HIGH (ref 0–44)
AST: 76 U/L — ABNORMAL HIGH (ref 15–41)
Albumin: 3.7 g/dL (ref 3.5–5.0)
Alkaline Phosphatase: 46 U/L (ref 38–126)
Anion gap: 7 (ref 5–15)
BUN: 18 mg/dL (ref 6–20)
CO2: 25 mmol/L (ref 22–32)
Calcium: 8.7 mg/dL — ABNORMAL LOW (ref 8.9–10.3)
Chloride: 106 mmol/L (ref 98–111)
Creatinine, Ser: 1.17 mg/dL (ref 0.61–1.24)
GFR, Estimated: >60 mL/min (ref >60)
Glucose, Bld: 96 mg/dL (ref 70–99)
Potassium: 3.4 mmol/L — ABNORMAL LOW (ref 3.5–5.1)
Sodium: 138 mmol/L (ref 135–145)
Total Bilirubin: 1.2 mg/dL (ref 0.3–1.2)
Total Protein: 6.3 g/dL — ABNORMAL LOW (ref 6.5–8.1)

## 2023-03-26 LAB — SALICYLATE LEVEL: Salicylate Lvl: <7 mg/dL — ABNORMAL LOW (ref 7.0–30.0)

## 2023-03-26 LAB — ACETAMINOPHEN LEVEL: Acetaminophen (Tylenol), Serum: <10 ug/mL — ABNORMAL LOW (ref 10–30)

## 2023-03-26 MED ORDER — GABAPENTIN 400 MG PO CAPS
400.0000 mg | ORAL_CAPSULE | Freq: Three times a day (TID) | ORAL | Status: DC
  Filled 2023-03-26: qty 1

## 2023-03-26 MED ORDER — HALOPERIDOL 5 MG PO TABS: 20.0000 mg | ORAL_TABLET | Freq: Every day | ORAL | Status: DC

## 2023-03-26 MED ORDER — BENZTROPINE MESYLATE 0.5 MG PO TABS: 1.0000 mg | ORAL_TABLET | Freq: Two times a day (BID) | ORAL | Status: DC

## 2023-03-26 MED ORDER — TEMAZEPAM 15 MG PO CAPS: 30.0000 mg | ORAL_CAPSULE | Freq: Every evening | ORAL | Status: DC | PRN

## 2023-03-26 NOTE — ED Triage Notes (Signed)
Pt reports PTSD but wont go into further detail about what he needs from the ER. Reports he doesn't need to see a psychiatrist, denies SI/HI and reports he takes no medications.
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
## 2023-03-26 NOTE — ED Provider Notes (Signed)
[place] EMERGENCY DEPARTMENT AT [facility] Provider Note   CSN: [id number] Arrival date & time: 03/26/23  0202     History  Chief Complaint  Patient presents with   Medical Clearance    Carlos [name] is a 32 y.o. male.  Level 5 caveat for psychiatric illness.  Patient with angry affect.  States "I have PTSD and need medical attention".  States he is not suicidal or homicidal but is hearing voices which is a chronic problem for him.  He takes medication for his PTSD but does not know what it is.  States he is here because his feet hurt.  He denies any chest pain, shortness of breath, abdominal pain, nausea or vomiting.  Denies any alcohol or drug use.  Unclear what precipitated him to come to the ED today.  Does have a history of psychosis, PTSD, depression.  Medication list includes Restoril, Haldol, Abilify, Cogentin  The history is provided by the patient. The history is limited by the condition of the patient.       Home Medications Prior to Admission medications   Medication Sig Start Date End Date Taking? Authorizing Provider  ARIPiprazole ER (ABILIFY MAINTENA) 400 MG SRER injection Inject 2 mLs (400 mg total) into the muscle every 28 (twenty-eight) days. Due next 10/14 Patient not taking: Reported on 10/29/2022 05/15/19   Malvin Johns, MD  benztropine (COGENTIN) 1 MG tablet Take 1 tablet (1 mg total) by mouth 2 (two) times daily. Patient not taking: Reported on 10/29/2022 04/18/19   Malvin Johns, MD  doxycycline (VIBRAMYCIN) 100 MG capsule Take 1 capsule (100 mg total) by mouth 2 (two) times daily. 01/05/23   Gwyneth Sprout, MD  gabapentin (NEURONTIN) 400 MG capsule Take 1 capsule (400 mg total) by mouth 3 (three) times daily. Patient not taking: Reported on 10/29/2022 04/18/19   Malvin Johns, MD  haloperidol (HALDOL) 20 MG tablet Take 1 tablet (20 mg total) by mouth at bedtime. Patient not taking: Reported on 10/29/2022 04/18/19   Malvin Johns, MD   Melatonin 3 MG TABS Take 3 mg by mouth at bedtime. Patient not taking: Reported on 10/29/2022    [provider]  temazepam (RESTORIL) 30 MG capsule Take 1 capsule (30 mg total) by mouth at bedtime as needed for sleep. Patient not taking: Reported on 10/29/2022 04/18/19   Malvin Johns, MD      Allergies    Depakote [divalproex sodium], Hinda Glatter [paliperidone er], and Other    Review of Systems   Review of Systems  Unable to perform ROS: Other  unwilling  Physical Exam Updated Vital Signs BP 115/75 (BP Location: Right Arm)   Pulse 73   Temp 98.6 F (37 C) (Oral)   Resp 16   Ht 6\' 1"  (1.854 m)   Wt 72.6 kg   SpO2 100%   BMI 21.11 kg/m  Physical Exam Vitals and nursing note reviewed.  Constitutional:      General: He is not in acute distress.    Appearance: He is well-developed.     Comments: Angry affect, does not respond to questions.  Pulls blanket overhead.  Oriented to person and place  HENT:     Head: Normocephalic and atraumatic.     Mouth/Throat:     Pharynx: No oropharyngeal exudate.  Eyes:     Conjunctiva/sclera: Conjunctivae normal.     Pupils: Pupils are equal, round, and reactive to light.  Neck:     Comments: No meningismus. Cardiovascular:  Rate and Rhythm: Normal rate and regular rhythm.     Heart sounds: Normal heart sounds. No murmur heard. Pulmonary:     Effort: Pulmonary effort is normal. No respiratory distress.     Breath sounds: Normal breath sounds.  Abdominal:     Palpations: Abdomen is soft.     Tenderness: There is no abdominal tenderness. There is no guarding or rebound.  Musculoskeletal:        General: No tenderness. Normal range of motion.     Cervical back: Normal range of motion and neck supple.  Skin:    General: Skin is warm.  Neurological:     Mental Status: He is alert.     Cranial Nerves: No cranial nerve deficit.     Motor: No abnormal muscle tone.     Coordination: Coordination normal.     Comments:  Cooperative with exam.  Moves all extremities equally.  Psychiatric:        Behavior: Behavior normal.     ED Results / Procedures / Treatments   Labs (all labs ordered are listed, but only abnormal results are displayed) Labs Reviewed  COMPREHENSIVE METABOLIC PANEL - Abnormal; Notable for the following components:      Result Value   Potassium 3.4 (*)    Calcium 8.7 (*)    Total Protein 6.3 (*)    AST 76 (*)    ALT 51 (*)    All other components within normal limits  ACETAMINOPHEN LEVEL - Abnormal; Notable for the following components:   Acetaminophen (Tylenol), Serum <10 (*)    All other components within normal limits  SALICYLATE LEVEL - Abnormal; Notable for the following components:   Salicylate Lvl <7.0 (*)    All other components within normal limits  CBC WITH DIFFERENTIAL/PLATELET  RAPID URINE DRUG SCREEN, HOSP PERFORMED    EKG None  Radiology No results found.  Procedures Procedures    Medications Ordered in ED Medications - No data to display  ED Course/ Medical Decision Making/ A&P                                 Medical Decision Making Amount and/or Complexity of Data Reviewed Labs: ordered. Decision-making details documented in ED Course. Radiology: ordered and independent interpretation performed. Decision-making details documented in ED Course. ECG/medicine tests: ordered and independent interpretation performed. Decision-making details documented in ED Course.  Risk Prescription drug management.   Patient here with psychiatric illness including PTSD and psychosis.  Unclear what precipitated him to come to the ED today.  Not forthcoming with history taking.  Vitals are stable.  No distress.  Patient angry and agitated, will not respond to questioning.  Continues to pull the blanket over his head.  States he is not suicidal.  Labs are reassuring.  Mild transaminitis.  Acetaminophen level undetectable, salicylate level undetectable.  TTS  consult is obtained given his paranoia and PTSD report.  He told the TTS counselor that he is seeing "beams" and thinks the government knows too much.  Observation and reassessment recommended He is medically clear for psychiatric evaluation.  Holding orders are placed.       Final Clinical Impression(s) / ED Diagnoses Final diagnoses:  None    Rx / DC Orders ED Discharge Orders     None         Tyashia Morrisette, Jeannett Senior, MD 03/26/23 [phone]

## 2023-03-26 NOTE — ED Notes (Signed)
Patient refused to change his clothes stated "I'm sleepy, don't bother me".

## 2023-04-02 ENCOUNTER — Emergency Department (HOSPITAL_COMMUNITY): Payer: 59

## 2023-04-02 ENCOUNTER — Other Ambulatory Visit: Payer: Self-pay

## 2023-04-02 ENCOUNTER — Emergency Department (HOSPITAL_COMMUNITY)
Admission: EM | Admit: 2023-04-02 | Discharge: 2023-04-03 | Disposition: A | Discharge status: Home / Self Care | Payer: 59 | Attending: Emergency Medicine | Admitting: Emergency Medicine

## 2023-04-02 ENCOUNTER — Encounter (HOSPITAL_COMMUNITY): Payer: Self-pay | Admitting: Emergency Medicine

## 2023-04-02 ENCOUNTER — Encounter (HOSPITAL_COMMUNITY): Payer: Self-pay | Admitting: *Deleted

## 2023-04-02 ENCOUNTER — Emergency Department (HOSPITAL_COMMUNITY)
Admission: EM | Admit: 2023-04-02 | Discharge: 2023-04-02 | Disposition: A | Discharge status: Home / Self Care | Payer: 59 | Attending: Emergency Medicine | Admitting: Emergency Medicine

## 2023-04-02 DIAGNOSIS — M25774 Osteophyte, right foot: Secondary | ICD-10-CM | POA: Diagnosis not present

## 2023-04-02 DIAGNOSIS — M79671 Pain in right foot: Secondary | ICD-10-CM | POA: Insufficient documentation

## 2023-04-02 DIAGNOSIS — Z743 Need for continuous supervision: Secondary | ICD-10-CM | POA: Diagnosis not present

## 2023-04-02 DIAGNOSIS — M25571 Pain in right ankle and joints of right foot: Secondary | ICD-10-CM | POA: Diagnosis not present

## 2023-04-02 MED ORDER — IBUPROFEN 800 MG PO TABS
800.0000 mg | ORAL_TABLET | Freq: Once | ORAL | Status: AC
  Administered 2023-04-02: 800 mg via ORAL
  Filled 2023-04-02: qty 1

## 2023-04-02 MED ORDER — ACETAMINOPHEN 325 MG PO TABS
650.0000 mg | ORAL_TABLET | Freq: Once | ORAL | Status: AC
  Administered 2023-04-02: 650 mg via ORAL
  Filled 2023-04-02: qty 2
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
## 2023-04-02 NOTE — Discharge Instructions (Addendum)
It was a pleasure taking part in your care today.  As we discussed, your workup is reassuring.  The x-rays we took of your ankle and foot are negative.  Please read the attached guide concerning foot pain.  Please follow-up with [facility] for reevaluation.  Please return to the ED with any new or worsening signs or symptoms.  You may continue taking ibuprofen or Tylenol at home for pain.

## 2023-04-03 NOTE — Discharge Instructions (Signed)
Can take tylenol or motrin for pain. You can follow-up with orthopedics if you have ongoing issues. Return to the ED for new or worsening symptoms.
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
## 2023-04-14 ENCOUNTER — Ambulatory Visit (HOSPITAL_COMMUNITY)
Admission: EM | Admit: 2023-04-14 | Discharge: 2023-04-14 | Disposition: A | Discharge status: Home / Self Care | Payer: 59 | Attending: Psychiatry | Admitting: Psychiatry

## 2023-04-14 DIAGNOSIS — M79671 Pain in right foot: Secondary | ICD-10-CM | POA: Diagnosis not present

## 2023-04-14 DIAGNOSIS — F4310 Post-traumatic stress disorder, unspecified: Secondary | ICD-10-CM

## 2023-04-14 NOTE — Discharge Instructions (Signed)
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
## 2023-07-29 DIAGNOSIS — M79652 Pain in left thigh: Secondary | ICD-10-CM | POA: Diagnosis not present

## 2023-07-29 DIAGNOSIS — F17210 Nicotine dependence, cigarettes, uncomplicated: Secondary | ICD-10-CM | POA: Diagnosis not present

## 2023-07-29 DIAGNOSIS — Z791 Long term (current) use of non-steroidal anti-inflammatories (NSAID): Secondary | ICD-10-CM | POA: Diagnosis not present

## 2023-07-29 DIAGNOSIS — S82142A Displaced bicondylar fracture of left tibia, initial encounter for closed fracture: Secondary | ICD-10-CM | POA: Diagnosis not present

## 2023-07-29 DIAGNOSIS — Z79899 Other long term (current) drug therapy: Secondary | ICD-10-CM | POA: Diagnosis not present

## 2023-07-29 DIAGNOSIS — M25511 Pain in right shoulder: Secondary | ICD-10-CM | POA: Diagnosis not present

## 2023-07-29 DIAGNOSIS — Z0489 Encounter for examination and observation for other specified reasons: Secondary | ICD-10-CM | POA: Diagnosis not present

## 2023-07-29 DIAGNOSIS — Z59819 Housing instability, housed unspecified: Secondary | ICD-10-CM | POA: Diagnosis not present

## 2023-07-29 DIAGNOSIS — R519 Headache, unspecified: Secondary | ICD-10-CM | POA: Diagnosis not present

## 2023-07-29 DIAGNOSIS — M79605 Pain in left leg: Secondary | ICD-10-CM | POA: Diagnosis not present

## 2023-07-29 DIAGNOSIS — S0990XA Unspecified injury of head, initial encounter: Secondary | ICD-10-CM | POA: Diagnosis not present

## 2023-07-29 DIAGNOSIS — Z5901 Sheltered homelessness: Secondary | ICD-10-CM | POA: Diagnosis not present

## 2023-07-29 DIAGNOSIS — T1490XA Injury, unspecified, initial encounter: Secondary | ICD-10-CM | POA: Diagnosis not present

## 2023-07-29 DIAGNOSIS — M25562 Pain in left knee: Secondary | ICD-10-CM | POA: Diagnosis not present

## 2023-07-29 DIAGNOSIS — M79672 Pain in left foot: Secondary | ICD-10-CM | POA: Diagnosis not present

## 2023-07-30 DIAGNOSIS — Z59819 Housing instability, housed unspecified: Secondary | ICD-10-CM | POA: Diagnosis not present

## 2023-08-28 DIAGNOSIS — R112 Nausea with vomiting, unspecified: Secondary | ICD-10-CM | POA: Diagnosis not present

## 2023-08-28 DIAGNOSIS — R451 Restlessness and agitation: Secondary | ICD-10-CM | POA: Diagnosis not present

## 2024-01-05 DIAGNOSIS — R441 Visual hallucinations: Secondary | ICD-10-CM | POA: Insufficient documentation

## 2024-01-05 DIAGNOSIS — F4310 Post-traumatic stress disorder, unspecified: Secondary | ICD-10-CM | POA: Insufficient documentation

## 2024-01-05 DIAGNOSIS — Z008 Encounter for other general examination: Secondary | ICD-10-CM | POA: Insufficient documentation

## 2024-01-05 DIAGNOSIS — Z5900 Homelessness unspecified: Secondary | ICD-10-CM | POA: Insufficient documentation

## 2024-01-05 DIAGNOSIS — F1911 Other psychoactive substance abuse, in remission: Secondary | ICD-10-CM | POA: Insufficient documentation
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
## 2024-01-06 ENCOUNTER — Ambulatory Visit (HOSPITAL_COMMUNITY)
Admission: EM | Admit: 2024-01-06 | Discharge: 2024-01-06 | Disposition: A | Discharge status: Home / Self Care | Attending: Nurse Practitioner | Admitting: Nurse Practitioner

## 2024-01-06 DIAGNOSIS — Z5900 Homelessness unspecified: Secondary | ICD-10-CM | POA: Diagnosis not present

## 2024-01-06 DIAGNOSIS — F4310 Post-traumatic stress disorder, unspecified: Secondary | ICD-10-CM | POA: Diagnosis not present

## 2024-01-06 DIAGNOSIS — Z1330 Encounter for screening examination for mental health and behavioral disorders, unspecified: Secondary | ICD-10-CM | POA: Diagnosis not present

## 2024-01-06 DIAGNOSIS — Z7689 Persons encountering health services in other specified circumstances: Secondary | ICD-10-CM

## 2024-01-06 DIAGNOSIS — Z008 Encounter for other general examination: Secondary | ICD-10-CM | POA: Diagnosis not present

## 2024-01-06 DIAGNOSIS — R441 Visual hallucinations: Secondary | ICD-10-CM | POA: Diagnosis not present

## 2024-01-06 DIAGNOSIS — F1911 Other psychoactive substance abuse, in remission: Secondary | ICD-10-CM | POA: Diagnosis not present
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
## 2024-01-06 NOTE — Discharge Instructions (Addendum)

## 2024-03-03 ENCOUNTER — Ambulatory Visit (HOSPITAL_COMMUNITY)
Admission: EM | Admit: 2024-03-03 | Discharge: 2024-03-05 | Disposition: A | Discharge status: Other Acute Inpatient Hospital | Attending: Nurse Practitioner | Admitting: Nurse Practitioner

## 2024-03-03 DIAGNOSIS — F200 Paranoid schizophrenia: Secondary | ICD-10-CM | POA: Diagnosis not present

## 2024-03-03 DIAGNOSIS — Z596 Low income: Secondary | ICD-10-CM | POA: Diagnosis not present

## 2024-03-03 DIAGNOSIS — F4310 Post-traumatic stress disorder, unspecified: Secondary | ICD-10-CM | POA: Insufficient documentation

## 2024-03-03 DIAGNOSIS — Z5900 Homelessness unspecified: Secondary | ICD-10-CM | POA: Diagnosis not present

## 2024-03-03 DIAGNOSIS — Z91148 Patient's other noncompliance with medication regimen for other reason: Secondary | ICD-10-CM | POA: Insufficient documentation

## 2024-03-03 DIAGNOSIS — Z79899 Other long term (current) drug therapy: Secondary | ICD-10-CM | POA: Diagnosis not present

## 2024-03-03 DIAGNOSIS — R45851 Suicidal ideations: Secondary | ICD-10-CM | POA: Insufficient documentation

## 2024-03-03 LAB — COMPREHENSIVE METABOLIC PANEL WITH GFR
ALT: 26 U/L (ref 0–44)
AST: 27 U/L (ref 15–41)
Albumin: 3.6 g/dL (ref 3.5–5.0)
Alkaline Phosphatase: 54 U/L (ref 38–126)
Anion gap: 10 (ref 5–15)
BUN: 16 mg/dL (ref 6–20)
CO2: 20 mmol/L — ABNORMAL LOW (ref 22–32)
Calcium: 9.1 mg/dL (ref 8.9–10.3)
Chloride: 110 mmol/L (ref 98–111)
Creatinine, Ser: 1.08 mg/dL (ref 0.61–1.24)
GFR, Estimated: >60 mL/min (ref >60)
Glucose, Bld: 59 mg/dL — ABNORMAL LOW (ref 70–99)
Potassium: 4.3 mmol/L (ref 3.5–5.1)
Sodium: 140 mmol/L (ref 135–145)
Total Bilirubin: 1.5 mg/dL — ABNORMAL HIGH (ref 0.0–1.2)
Total Protein: 6 g/dL — ABNORMAL LOW (ref 6.5–8.1)

## 2024-03-03 LAB — CBC WITH DIFFERENTIAL/PLATELET
Abs Granulocyte: 3.2 K/uL (ref 1.5–6.5)
Abs Immature Granulocytes: 0.01 K/uL (ref 0.00–0.07)
Basophils Absolute: 0.1 K/uL (ref 0.0–0.1)
Basophils Relative: 1 %
Eosinophils Absolute: 0.2 K/uL (ref 0.0–0.5)
Eosinophils Relative: 3 %
HCT: 43.6 % (ref 39.0–52.0)
Hemoglobin: 14.4 g/dL (ref 13.0–17.0)
Immature Granulocytes: 0 %
Lymphocytes Relative: 26 %
Lymphs Abs: 1.4 K/uL (ref 0.7–4.0)
MCH: 29.8 pg (ref 26.0–34.0)
MCHC: 33 g/dL (ref 30.0–36.0)
MCV: 90.1 fL (ref 80.0–100.0)
Monocytes Absolute: 0.4 K/uL (ref 0.1–1.0)
Monocytes Relative: 8 %
Neutro Abs: 3.2 K/uL (ref 1.7–7.7)
Neutrophils Relative %: 62 %
Platelets: 211 K/uL (ref 150–400)
RBC: 4.84 MIL/uL (ref 4.22–5.81)
RDW: 13.5 % (ref 11.5–15.5)
WBC: 5.2 K/uL (ref 4.0–10.5)
nRBC: 0 % (ref 0.0–0.2)

## 2024-03-03 LAB — POCT URINE DRUG SCREEN - MANUAL ENTRY (I-SCREEN)
POC Amphetamine UR: NOT DETECTED
POC Buprenorphine (BUP): NOT DETECTED
POC Cocaine UR: NOT DETECTED
POC Marijuana UR: POSITIVE — AB
POC Methadone UR: NOT DETECTED
POC Methamphetamine UR: NOT DETECTED
POC Morphine: NOT DETECTED
POC Oxazepam (BZO): NOT DETECTED
POC Oxycodone UR: NOT DETECTED
POC Secobarbital (BAR): NOT DETECTED

## 2024-03-03 LAB — MAGNESIUM: Magnesium: 2.2 mg/dL (ref 1.7–2.4)

## 2024-03-03 LAB — ETHANOL: Alcohol, Ethyl (B): <15 mg/dL (ref <15)

## 2024-03-03 LAB — LIPID PANEL
Cholesterol: 162 mg/dL (ref 0–200)
HDL: 71 mg/dL (ref >40)
LDL Cholesterol: 87 mg/dL (ref 0–99)
Total CHOL/HDL Ratio: 2.3 ratio
Triglycerides: 21 mg/dL (ref <150)
VLDL: 4 mg/dL (ref 0–40)

## 2024-03-03 LAB — TSH: TSH: 1.752 u[IU]/mL (ref 0.350–4.500)

## 2024-03-03 MED ORDER — ACETAMINOPHEN 325 MG PO TABS: 650.0000 mg | ORAL_TABLET | Freq: Four times a day (QID) | ORAL | Status: DC | PRN

## 2024-03-03 MED ORDER — ALUM & MAG HYDROXIDE-SIMETH 200-200-20 MG/5ML PO SUSP: 30.0000 mL | ORAL | Status: DC | PRN

## 2024-03-03 MED ORDER — LORAZEPAM 1 MG PO TABS
2.0000 mg | ORAL_TABLET | Freq: Once | ORAL | Status: AC
  Administered 2024-03-03: 2 mg via ORAL
  Filled 2024-03-03: qty 2

## 2024-03-03 MED ORDER — MAGNESIUM HYDROXIDE 400 MG/5ML PO SUSP: 30.0000 mL | Freq: Every day | ORAL | Status: DC | PRN

## 2024-03-03 MED ORDER — HALOPERIDOL LACTATE 5 MG/ML IJ SOLN: 5.0000 mg | Freq: Three times a day (TID) | INTRAMUSCULAR | Status: DC | PRN

## 2024-03-03 MED ORDER — BENZTROPINE MESYLATE 1 MG PO TABS
1.0000 mg | ORAL_TABLET | Freq: Two times a day (BID) | ORAL | Status: DC
  Administered 2024-03-03: 1 mg via ORAL
  Filled 2024-03-03 (×2): qty 1

## 2024-03-03 MED ORDER — DIPHENHYDRAMINE HCL 50 MG PO CAPS: 50.0000 mg | ORAL_CAPSULE | Freq: Three times a day (TID) | ORAL | Status: DC | PRN

## 2024-03-03 MED ORDER — LORAZEPAM 2 MG/ML IJ SOLN: 2.0000 mg | Freq: Three times a day (TID) | INTRAMUSCULAR | Status: DC | PRN

## 2024-03-03 MED ORDER — HYDROXYZINE HCL 25 MG PO TABS: 25.0000 mg | ORAL_TABLET | Freq: Three times a day (TID) | ORAL | Status: DC | PRN

## 2024-03-03 MED ORDER — HALOPERIDOL 5 MG PO TABS: 5.0000 mg | ORAL_TABLET | Freq: Three times a day (TID) | ORAL | Status: DC | PRN

## 2024-03-03 MED ORDER — HALOPERIDOL LACTATE 5 MG/ML IJ SOLN: 10.0000 mg | Freq: Three times a day (TID) | INTRAMUSCULAR | Status: DC | PRN

## 2024-03-03 MED ORDER — TRAZODONE HCL 50 MG PO TABS
50.0000 mg | ORAL_TABLET | Freq: Every evening | ORAL | Status: DC | PRN
  Administered 2024-03-03 (×2): 50 mg via ORAL
  Filled 2024-03-03 (×2): qty 1

## 2024-03-03 MED ORDER — DIPHENHYDRAMINE HCL 50 MG/ML IJ SOLN: 50.0000 mg | Freq: Three times a day (TID) | INTRAMUSCULAR | Status: DC | PRN

## 2024-03-03 MED ORDER — HALOPERIDOL 5 MG PO TABS
5.0000 mg | ORAL_TABLET | Freq: Once | ORAL | Status: AC
  Administered 2024-03-03: 5 mg via ORAL
  Filled 2024-03-03: qty 1

## 2024-03-03 MED ORDER — HALOPERIDOL 5 MG PO TABS
5.0000 mg | ORAL_TABLET | Freq: Two times a day (BID) | ORAL | Status: DC
  Administered 2024-03-03 – 2024-03-05 (×3): 5 mg via ORAL
  Filled 2024-03-03 (×4): qty 1

## 2024-03-03 NOTE — ED Notes (Signed)
 Patient finally woke up and took a shower he is now eating dinner and talking with peer on unit.  He is cal and in control.  No delusional or psychotic behavior or thoughts expressed.

## 2024-03-03 NOTE — Group Note (Deleted)
 Group Topic: Recovery Basics  Group Date: 03/03/2024 Start Time: 0800 End Time: 0900 Facilitators: Rolinda Monta ORN, NT  Department: [facility]  Number of Participants: 6  Group Focus: chemical dependency education, chemical dependency issues, co-dependency, and community group Treatment Modality:  Cognitive Behavioral Therapy Interventions utilized were patient education, story telling, and support Purpose: enhance coping skills, express feelings, and increase insight   Name: Carlos [name] Date of Birth: 1991-06-24  MR: [id number]    Level of Participation: {THERAPIES; PSYCH GROUP PARTICIPATION OZCZO:76008} Quality of Participation: {THERAPIES; PSYCH QUALITY OF PARTICIPATION:23992} Interactions with others: {THERAPIES; PSYCH INTERACTIONS:23993} Mood/Affect: {THERAPIES; PSYCH MOOD/AFFECT:23994} Triggers (if applicable): *** Cognition: {THERAPIES; PSYCH COGNITION:23995} Progress: {THERAPIES; PSYCH PROGRESS:23997} Response: *** Plan: {THERAPIES; PSYCH EOJW:76003}  Patients Problems:  Patient Active Problem List   Diagnosis Date Noted   Substance induced mood disorder (HCC) 12/18/2022   Paranoid schizophrenia (HCC) 04/12/2019   Disorganized schizophrenia (HCC)    

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
## 2024-03-03 NOTE — ED Notes (Signed)
 Patient, still sleeping no issue observed.
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
## 2024-03-03 NOTE — BH Assessment (Signed)
 Comprehensive Clinical Assessment (CCA) Note  03/03/2024 Carlos [name] [id number]  Disposition: Roxianne Olp, NP recommends pt to be admitted to [facility] for Observation.   The patient demonstrates the following risk factors for suicide: Chronic risk factors for suicide include: psychiatric disorder of Paranoid schizophrenia (HCC). Acute risk factors for suicide include: Pt denies SI. Protective factors for this patient include: Pt denies, SI. Considering these factors, the overall suicide risk at this point appears to be low. Patient is not appropriate for outpatient follow up.  Carlos [name] is a 33 year old male who presents voluntary and unaccompanied to [facility] ([facility]). Clinician asked the pt, what brought you to the hospital? Pt denies SI or past suicidal attempts. Pt showed clinician a mark on his arm. Pt talked bout buildings and cousins. Pt was hard to follow during assessment.   As clinician approached the assessment room she observed the pt actively talking to himself. During the assessment, pt was a poor historian, he answered no more than three questions and began jumping to other topics. Pt presents alert in causal attire with flight of ideas, tangential speech. Pt's mood was irritable, paranoid, manic. Pt's affect was congruent. Pt's insight was shallow. Pt's judgement was poor.   Chief Complaint:  Chief Complaint  Patient presents with   Suicidal Ideation   Paranoid   Visit Diagnosis: Paranoid schizophrenia (HCC).   CCA Screening, Triage and Referral (STR)  Patient Reported Information How did you hear about us ? Self  What Is the Reason for Your Visit/Call Today? Pt presents to [facility] as a voluntary walk-in, unaccompanied with complaint of paranoia and SI, with no plan/intent. Pt expresses that he is experiencing homelessness at this time. Pt reports diagnosis of anxiety and PTSD. PT became agitated as this Clinical research associate  continued to ask questions. Pt trailed off to various topics during triage process. Pt currently denies HI,AVH and substance/alcohol use.  How Long Has This Been Causing You Problems? > than 6 months  What Do You Feel Would Help You the Most Today? Housing Assistance; Social Support; Food Assistance; Financial Resources; Treatment for Depression or other mood problem   Have You Recently Had Any Thoughts About Hurting Yourself? Yes  Are You Planning to Commit Suicide/Harm Yourself At This time? No   Flowsheet Row ED from 03/03/2024 in [facility] ED from 01/06/2024 in [facility] ED from 04/14/2023 in [facility]  C-SSRS RISK CATEGORY Low Risk Low Risk No Risk    Have you Recently Had Thoughts About Hurting Someone Sherral? No  Are You Planning to Harm Someone at This Time? No  Explanation: NA   Have You Used Any Alcohol or Drugs in the Past 24 Hours? No  How Long Ago Did You Use Drugs or Alcohol? UTA What Did You Use and How Much? UTA   Do You Currently Have a Therapist/Psychiatrist? -- (UTA)  Name of Therapist/Psychiatrist:    Have You Been Recently Discharged From Any Office Practice or Programs? -- (UTA)  Explanation of Discharge From Practice/Program: UTA     CCA Screening Triage Referral Assessment Type of Contact: Face-to-Face  Telemedicine Service Delivery:   Is this Initial or Reassessment?   Date Telepsych consult ordered in [facility]:    Time Telepsych consult ordered in [facility]:    Location of Assessment: [facility]  Provider Location: [facility] Assessment Services   Collateral Involvement: None.   Does Patient  Have a Automotive engineer Guardian? -- (UTA)  Legal Guardian Contact Information: UTA  Copy of Legal Guardianship Form: -- (UTA)  Legal Guardian Notified of Arrival: -- (UTA)  Legal Guardian Notified of Pending Discharge: -- (UTA)  If Minor and Not  Living with Parent(s), Who has Custody? UTA  Is CPS involved or ever been involved? -- (UTA)  Is APS involved or ever been involved? -- (UTA)   Patient Determined To Be At Risk for Harm To Self or Others Based on Review of Patient Reported Information or Presenting Complaint? No  Method: No Plan  Availability of Means: -- (UTA)  Intent: Vague intent or NA  Notification Required: No need or identified person  Additional Information for Danger to Others Potential: Active psychosis  Additional Comments for Danger to Others Potential: UTA  Are There Guns or Other Weapons in Your Home? -- (UTA)  Types of Guns/Weapons: UTA  Are These Weapons Safely Secured?                            -- (UTA)  Who Could Verify You Are Able To Have These Secured: UTA  Do You Have any Outstanding Charges, Pending Court Dates, Parole/Probation? UTA  Contacted To Inform of Risk of Harm To Self or Others: Other: Comment (UTA)    Does Patient Present under Involuntary Commitment? No    [place] of Residence: [place]   Patient Currently Receiving the Following Services: -- (UTA)   Determination of Need: Routine (7 days)   Options For Referral: Other: Comment; Outpatient Therapy; Medication Management     CCA Biopsychosocial Patient Reported Schizophrenia/Schizoaffective Diagnosis in Past: Yes (Per chart.)   Strengths: UTA   Mental Health Symptoms Depression:  Difficulty Concentrating; Irritability   Duration of Depressive symptoms: Duration of Depressive Symptoms: N/A   Mania:  Racing thoughts   Anxiety:   Irritability   Psychosis:  Delusions; Hallucinations   Duration of Psychotic symptoms: Duration of Psychotic Symptoms: N/A   Trauma:  -- (UTA)   Obsessions:  N/A   Compulsions:  N/A   Inattention:  Disorganized   Hyperactivity/Impulsivity:  Feeling of restlessness   Oppositional/Defiant Behaviors:  -- (UTA)   Emotional Irregularity:  Mood lability   Other  Mood/Personality Symptoms:  UTA    Mental Status Exam Appearance and self-care  Stature:  Tall   Weight:  Thin   Clothing:  Casual   Grooming:  Neglected   Cosmetic use:  None   Posture/gait:  Normal   Motor activity:  Restless; Repetitive (Pt is sleepy)   Sensorium  Attention:  Inattentive   Concentration:  Anxiety interferes; Scattered   Orientation:  Object; Person; Place; Situation   Recall/memory:  Defective in Short-term; Defective in Immediate   Affect and Mood  Affect:  Congruent   Mood:  Irritable; Other (Comment) (Paranoia, manic.)   Relating  Eye contact:  Staring   Facial expression:  Responsive   Attitude toward examiner:  Cooperative; Dramatic   Thought and Language  Speech flow: Flight of Ideas; Other (Comment) (Tangential.)   Thought content:  Delusions   Preoccupation:  Other (Comment) (UTA)   Hallucinations:  Other (Comment) (UTA)   Organization:  Insurance underwriter of Knowledge:  Poor   Intelligence:  Average   Abstraction:  Abstract   Judgement:  Poor   Reality Testing:  Distorted   Insight:  Shallow   Decision Making:  Confused  Social Functioning  Social Maturity:  Impulsive   Social Judgement:  -- (UTA)   Stress  Stressors:  Other (Comment) (UTA)   Coping Ability:  -- (UTA)   Skill Deficits:  Decision making; Self-care; Communication; Responsibility; Self-control   Supports:  Support needed     Religion: Religion/Spirituality Are You A Religious Person?:  (UTA) How Might This Affect Treatment?: UTA  Leisure/Recreation: Leisure / Recreation Do You Have Hobbies?:  (UTA)  Exercise/Diet: Exercise/Diet Do You Exercise?:  (UTA) Have You Gained or Lost A Significant Amount of Weight in the Past Six Months?:  (UTA) Do You Follow a Special Diet?:  (UTA) Do You Have Any Trouble Sleeping?:  (UTA)   CCA Employment/Education Employment/Work Situation: Employment / Work  Situation Employment Situation:  Industrial/product designer) Patient's Job has Been Impacted by Current Illness:  (UTA) Has Patient ever Been in the [employer]?: Yes (Describe in comment) Garment/textile technologist.) Did You Receive Any Psychiatric Treatment/Services While in the [employer]?:  (UTA)  Education: Education Is Patient Currently Attending School?:  (UTA) Last Grade Completed:  (UTA) Did You Attend College?:  (UTA) Did You Have An Individualized Education Program (IIEP):  (UTA) Did You Have Any Difficulty At School?:  (UTA) Patient's Education Has Been Impacted by Current Illness:  (UTA)   CCA Family/Childhood History Family and Relationship History: Family history Marital status: Other (comment) (UTA) Does patient have children?:  (UTA)  Childhood History:  Childhood History By whom was/is the patient raised?: Other (Comment) Did patient suffer any verbal/emotional/physical/sexual abuse as a child?:  (UTA) Did patient suffer from severe childhood neglect?:  (UTA) Has patient ever been sexually abused/assaulted/raped as an adolescent or adult?:  (UTA) Was the patient ever a victim of a crime or a disaster?:  (UTA) Witnessed domestic violence?:  (UTA) Has patient been affected by domestic violence as an adult?:  (UTA)   CCA Substance Use Alcohol/Drug Use: Alcohol / Drug Use Pain Medications: See  MAR Prescriptions: See MAR Over the Counter: See MAR History of alcohol / drug use?:  (UTA) Longest period of sobriety (when/how long): UTA Negative Consequences of Use:  (UTA) Withdrawal Symptoms: Other (Comment) (UTA)    ASAM's:  Six Dimensions of Multidimensional Assessment  Dimension 1:  Acute Intoxication and/or Withdrawal Potential:      Dimension 2:  Biomedical Conditions and Complications:      Dimension 3:  Emotional, Behavioral, or Cognitive Conditions and Complications:     Dimension 4:  Readiness to Change:     Dimension 5:  Relapse, Continued use, or Continued Problem Potential:     Dimension  6:  Recovery/Living Environment:     ASAM Severity Score:    ASAM Recommended Level of Treatment:     Substance use Disorder (SUD)    Recommendations for Services/Supports/Treatments: Recommendations for Services/Supports/Treatments Recommendations For Services/Supports/Treatments: Other (Comment) (Pt to be admitted to [facility] for Observation.)  Disposition Recommendation per psychiatric provider: Pt to be admitted to [facility] for Observation.    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      Referrals to Alternative Service(s): Referred to Alternative Service(s):   Place:   Date:   Time:    Referred to Alternative  Service(s):   Place:   Date:   Time:    Referred to Alternative Service(s):   Place:   Date:   Time:    Referred to Alternative Service(s):   Place:   Date:   Time:     Jackson JONETTA Broach, [facility] Comprehensive Clinical Assessment (CCA) Screening, Triage and Referral Note  03/03/2024 Carlos [name] [id number]  Chief Complaint:  Chief Complaint  Patient presents with   Suicidal Ideation   Paranoid   Visit Diagnosis:   Patient Reported Information How did you hear about us ? Self  What Is the Reason for Your Visit/Call Today? Pt presents to [facility] as a voluntary walk-in, unaccompanied with complaint of paranoia and SI, with no plan/intent. Pt expresses that he is experiencing homelessness at this time. Pt reports diagnosis of anxiety and PTSD. PT became agitated as this Clinical research associate continued to ask questions. Pt trailed off to various topics during triage process. Pt currently denies HI,AVH and substance/alcohol use.  How Long  Has This Been Causing You Problems? > than 6 months  What Do You Feel Would Help You the Most Today? Housing Assistance; Social Support; Food Assistance; Financial Resources; Treatment for Depression or other mood problem   Have You Recently Had Any Thoughts About Hurting Yourself? Yes  Are You Planning to Commit Suicide/Harm Yourself At This time? No   Have you Recently Had Thoughts About Hurting Someone Sher[place]? No  Are You Planning to Harm Someone at This Time? No  Explanation: NA   Have You Used Any Alcohol or Drugs in the Past 24 Hours? No  How Long Ago Did You Use Drugs or Alcohol? UTA What Did You Use and How Much? pt denies   Do You Currently Have a Therapist/Psychiatrist? -- (UTA)  Name of Therapist/Psychiatrist: [facility] team   Have You Been Recently Discharged From Any Office Practice or Programs? -- (UTA)  Explanation of Discharge From Practice/Program: No recent discharges    CCA Screening Triage Referral Assessment Type of Contact: Face-to-Face  Telemedicine Service Delivery:   Is this Initial or Reassessment?   Date Telepsych consult ordered in [facility]:    Time Telepsych consult ordered in [facility]:    Location of Assessment: [facility] Services  Provider Location: [facility] Services    Collateral Involvement: None.   Does Patient Have a Automotive engineer Guardian? UTA Name and Contact of Legal Guardian: UTA If Minor and Not Living with Parent(s), Who has Custody? UTA  Is CPS involved or ever been involved? -- (UTA)  Is APS involved or ever been involved? -- (UTA)   Patient Determined To Be At Risk for Harm To Self or Others Based on Review of Patient Reported Information or Presenting Complaint? No  Method: No Plan  Availability of Means: -- (UTA)  Intent: Vague intent or NA  Notification Required: No need or identified person  Additional Information for Danger to Others Potential: Active psychosis  Additional  Comments for Danger to Others Potential: UTA  Are There Guns or Other Weapons in Your Home? -- (UTA)  Types of Guns/Weapons: UTA  Are These Weapons Safely Secured?                            -- (UTA)  Who Could Verify You Are Able To Have These Secured: UTA  Do You Have any Outstanding Charges, Pending Court Dates, Parole/Probation? UTA  Contacted To Inform of Risk of  Harm To Self or Others: Other: Comment (UTA)   Does Patient Present under Involuntary Commitment? No    [place] of Residence: [place]   Patient Currently Receiving the Following Services: -- (UTA)   Determination of Need: Routine (7 days)   Options For Referral: Other: Comment; Outpatient Therapy; Medication Management   Disposition Recommendation per psychiatric provider: Pt to be admitted to [facility] for Observation.   Jackson JONETTA Broach, LCMHC   [name] Bredeson D Darcelle Herrada, MS, [facility], [facility] Triage Specialist [phone]

## 2024-03-03 NOTE — ED Provider Notes (Signed)
 Behavioral Health Progress Note  Date and Time: 03/03/2024 12:41 PM Name: Carlos [name] MRN:  [id number]   Carlos [name] is a 33 y/o male, who is homeless, with past psychiatric significant for schizophrenia, PTSD, polysubstance use, and medication noncompliance presented voluntarily to [facility] as a walk in un accompanied by with complaints of homeless and wanting to get back on his medications.   Subjective:   The patient was evaluated on the unit, who was somnolent and had difficulty opening his eyes upon request.  He was unable to answer questions related to sleep, appetite, and mood.  There was limited to no assessment of suicidal ideations, homicidal ideations, and hallucinations.  RN reports patient received 1X dose of Haldol  5 mg and Ativan  2 mg at 3 AM, and has been presented sedated since.  Successfully contacted [facility] today at [phone], and was able to speak to a representative who informed me that patient no longer follows with them as of ~6-9 months ago.    Diagnosis:  Final diagnoses:  Paranoid schizophrenia (HCC)  PTSD (post-traumatic stress disorder)    Total Time spent with patient: 30 minutes  Past Psychiatric History:   schizophrenia, PTSD, polysubstance use, substance induced mood disorder, medication non compliance, prior psychiatric admissions 4x (last in 2020 at [facility] per chart review) Previously connected with [facility] ACT services Past Medical History:  No significant past medical history noted Allergy to Invega, Depakote noted Family History: unknown Family Psychiatric  History:  Per chart review, brother with unspecified mental illness Social History:  Patient is homeless  Additional Social History:    Pain Medications: See  MAR Prescriptions: See MAR Over the Counter: See MAR History of alcohol / drug use?:  (UTA) Longest period of sobriety (when/how long): UTA Negative Consequences of Use:  (UTA) Withdrawal  Symptoms: Other (Comment) (UTA)   Current Medications:  Current Facility-Administered Medications  Medication Dose Route Frequency Provider Last Rate Last Admin   acetaminophen  (TYLENOL ) tablet 650 mg  650 mg Oral Q6H PRN Bobbitt, Shalon E, NP       alum & mag hydroxide-simeth (MAALOX/MYLANTA) 200-200-20 MG/5ML suspension 30 mL  30 mL Oral Q4H PRN Bobbitt, Shalon E, NP       benztropine  (COGENTIN ) tablet 1 mg  1 mg Oral BID Bobbitt, Shalon E, NP       haloperidol  (HALDOL ) tablet 5 mg  5 mg Oral TID PRN Bobbitt, Shalon E, NP       And   diphenhydrAMINE  (BENADRYL ) capsule 50 mg  50 mg Oral TID PRN Bobbitt, Shalon E, NP       haloperidol  lactate (HALDOL ) injection 5 mg  5 mg Intramuscular TID PRN Bobbitt, Shalon E, NP       And   diphenhydrAMINE  (BENADRYL ) injection 50 mg  50 mg Intramuscular TID PRN Bobbitt, Shalon E, NP       And   LORazepam  (ATIVAN ) injection 2 mg  2 mg Intramuscular TID PRN Bobbitt, Shalon E, NP       haloperidol  lactate (HALDOL ) injection 10 mg  10 mg Intramuscular TID PRN Bobbitt, Shalon E, NP       And   diphenhydrAMINE  (BENADRYL ) injection 50 mg  50 mg Intramuscular TID PRN Bobbitt, Shalon E, NP       And   LORazepam  (ATIVAN ) injection 2 mg  2 mg Intramuscular TID PRN Bobbitt, Shalon E, NP       haloperidol  (HALDOL ) tablet 5 mg  5 mg  Oral BID Bobbitt, Shalon E, NP       hydrOXYzine  (ATARAX ) tablet 25 mg  25 mg Oral TID PRN Bobbitt, Shalon E, NP       magnesium  hydroxide (MILK OF MAGNESIA) suspension 30 mL  30 mL Oral Daily PRN Bobbitt, Shalon E, NP       traZODone  (DESYREL ) tablet 50 mg  50 mg Oral QHS PRN Bobbitt, Shalon E, NP   50 mg at 03/03/24 9671   Current Outpatient Medications  Medication Sig Dispense Refill   ARIPiprazole  ER (ABILIFY  MAINTENA) 400 MG SRER injection Inject 2 mLs (400 mg total) into the muscle every 28 (twenty-eight) days. Due next 10/14 (Patient not taking: Reported on 10/29/2022) 1 each 11   benztropine  (COGENTIN ) 1 MG tablet Take 1 tablet  (1 mg total) by mouth 2 (two) times daily. (Patient not taking: Reported on 10/29/2022) 60 tablet 2   doxycycline  (VIBRAMYCIN ) 100 MG capsule Take 1 capsule (100 mg total) by mouth 2 (two) times daily. (Patient not taking: Reported on 03/26/2023) 14 capsule 0   gabapentin  (NEURONTIN ) 400 MG capsule Take 1 capsule (400 mg total) by mouth 3 (three) times daily. (Patient not taking: Reported on 10/29/2022) 90 capsule 2   haloperidol  (HALDOL ) 20 MG tablet Take 1 tablet (20 mg total) by mouth at bedtime. (Patient not taking: Reported on 10/29/2022) 90 tablet 1   temazepam  (RESTORIL ) 30 MG capsule Take 1 capsule (30 mg total) by mouth at bedtime as needed for sleep. (Patient not taking: Reported on 10/29/2022) 30 capsule 0    Labs  Lab Results:  Admission on 03/03/2024  Component Date Value Ref Range Status   POC Amphetamine UR 03/03/2024 None Detected  NONE DETECTED (Cut Off Level 1000 ng/mL) Final   POC Secobarbital (BAR) 03/03/2024 None Detected  NONE DETECTED (Cut Off Level 300 ng/mL) Final   POC Buprenorphine (BUP) 03/03/2024 None Detected  NONE DETECTED (Cut Off Level 10 ng/mL) Final   POC Oxazepam (BZO) 03/03/2024 None Detected  NONE DETECTED (Cut Off Level 300 ng/mL) Final   POC Cocaine UR 03/03/2024 None Detected  NONE DETECTED (Cut Off Level 300 ng/mL) Final   POC Methamphetamine UR 03/03/2024 None Detected  NONE DETECTED (Cut Off Level 1000 ng/mL) Final   POC Morphine 03/03/2024 None Detected  NONE DETECTED (Cut Off Level 300 ng/mL) Final   POC Methadone UR 03/03/2024 None Detected  NONE DETECTED (Cut Off Level 300 ng/mL) Final   POC Oxycodone UR 03/03/2024 None Detected  NONE DETECTED (Cut Off Level 100 ng/mL) Final   POC Marijuana UR 03/03/2024 Positive (A)  NONE DETECTED (Cut Off Level 50 ng/mL) Final    Blood Alcohol level:  Lab Results  Component Value Date   ETH <10 04/11/2019   ETH <10 01/19/2019    Metabolic Disorder Labs: Lab Results  Component Value Date   HGBA1C 5.3  10/29/2022   MPG 105 10/29/2022   MPG 108 08/29/2015   Lab Results  Component Value Date   PROLACTIN 113.1 (H) 08/29/2015   Lab Results  Component Value Date   CHOL 151 10/29/2022   TRIG 45 10/29/2022   HDL 56 10/29/2022   CHOLHDL 2.7 10/29/2022   VLDL 9 10/29/2022   LDLCALC 86 10/29/2022   LDLCALC 68 05/04/2016    Therapeutic Lab Levels: No results found for: LITHIUM Lab Results  Component Value Date   VALPROATE <10 (L) 08/24/2015   VALPROATE <10 (L) 08/22/2015   Lab Results  Component Value Date   CBMZ  8.7 05/05/2016    Physical Findings   AIMS    Flowsheet Row [facility]) [facility]  AIMS Total Score 0 0   AUDIT    Flowsheet Row [facility]) [facility] Ad[facility] Ad[facility] ([facility]  Alcohol Use Disorder Identification Test Final Score (AUDIT) 0 0 3   Flowsheet Row [facility]03[facility]20[facility]  C-SSRS RISK CATEGORY Low Risk Low Risk No Risk     Psychiatric Specialty Exam  Presentation  General Appearance:  Disheveled  Eye Contact: Poor (eye closed)  Speech: Clear and Coherent; Normal Rate  Speech Volume: Decreased  Handedness: Right   Mood and Affect  Mood: -- (sedated, unable to formally assess)  Affect: Flat   Thought Process  Thought Processes: -- (unable to formally assess)  Descriptions of Associations:-- (unable to formally assess)  Orientation:-- (unable to formally assess)  Thought Content:-- (unable to formally  assess)  Diagnosis of Schizophrenia or Schizoaffective disorder in past: Yes  Duration of Psychotic Symptoms: Greater than six months   Hallucinations:Hallucinations: -- (unable to formally assess) Description of Visual Hallucinations: seeing machines going down the street talking  Ideas of Reference:-- (unable to formally assess)  Suicidal Thoughts:Suicidal Thoughts: No SI Passive Intent and/or Plan: With Intent; Without Plan  Homicidal Thoughts:Homicidal Thoughts: No   Sensorium  Memory: Immediate Poor; Remote Poor; Recent Poor  Judgment: Poor  Insight: Poor   Executive Functions  Concentration: Poor  Attention Span: Poor  Recall: Poor  Fund of Knowledge: Poor  Language: Poor   Psychomotor Activity  Psychomotor Activity: Psychomotor Activity: Decreased   Assets  Assets: Communication Skills; Desire for Improvement   Sleep  Sleep: Sleep: Fair  No Safety Checks orders active in given range  Nutritional Assessment (For [facility] and [facility] admissions only) Has the patient had a weight loss or gain of 10 pounds or more in the last 3 months?: No Has the patient had a decrease in food intake/or appetite?: No Does the patient have dental problems?: No Does the patient have eating habits or behaviors that may be indicators of an eating disorder including binging or inducing vomiting?: No Has the patient recently lost weight without trying?: 0 Has the patient been eating poorly because of a decreased appetite?: 0 Malnutrition Screening Tool Score: 0    Physical Exam  Physical Exam Vitals and nursing note reviewed.  Constitutional:      General: He is not in acute distress.    Appearance: Normal appearance.  HENT:     Head: Normocephalic and atraumatic.  Pulmonary:     Effort: Pulmonary effort is normal. No respiratory distress.  Musculoskeletal:        General: Normal range of motion.  Skin:    General: Skin is warm and dry.  Neurological:      General: No focal deficit present.     Mental Status: He is alert.    Review of Systems  Unable to perform ROS: Other (sedation)   Blood pressure (!) 126/98, pulse 64, temperature 98.8 F (37.1 C), temperature source Oral, resp. rate 18, SpO2 100%. There is no height or weight on  file to calculate BMI.  Treatment Plan Summary: Daily contact with patient to assess and evaluate symptoms and progress in treatment and Medication management   Long Term Goals: Improvement in symptoms so as ready for discharge   Short Term Goals: Patient will verbalize feelings in meetings with treatment team members., Patient will attend at least of 50% of the groups daily., Pt will complete the PHQ9 on admission, day 3 and discharge., and Patient will take medications as prescribed daily.   Patient with a history of schizophrenia, presenting with possible auditory hallucinations and disorganized behavior. Today's evaluation was significantly limited due to sedation following administration of the agitation protocol overnight ~03:00 AM. Per overnight documentation, there was concern for ongoing psychosis, though further assessment is needed to clarify current symptom burden, level of functioning, and need for specialized psychiatric placement. At this time, it remains unclear what level of care or bed type would be most appropriate. Will continue to monitor and reassess once sedation resolves. Labs are currently pending and may further inform medical clearance and disposition planning.   Medications: Mood/anxiety: continue group therapy, milieu therapy, 1:1 evaluation with provider.  Medication management:  Continue Haldol  5 mg every 12 hours Continue Cogentin  1 mg every 12 hours Substance Abuse:  Cannabis use disorder: Plan to encourage insight.  Limited education provided due to patient's ongoing somnolence Nicotine  and tobacco use: N/A Medical: PRNs for pain, constipation, indigestion available.  Pending  labs/studies: CBC, CMP, ethanol, lipid panel, magnesium , TSH  UDS positive for THC Safety and Monitoring: voluntarily admission to [facility]/Observation unit for safety, stabilization and treatment Daily contact with patient to assess and evaluate symptoms and progress in treatment Patient's case to be discussed in multi-disciplinary team meeting Observation Level : q15 minute checks Vital signs: q12 hours Precautions: Routine  Based on my evaluation the patient does not appear to have an emergency medical condition.    Marlo Masson, MD 03/03/2024 12:41 PM

## 2024-03-03 NOTE — ED Notes (Signed)
 Pt is asleep in recliner, no distress noted. Staff will continue to monitor pt for safety.

## 2024-03-03 NOTE — ED Notes (Signed)
 Pt has diagnosis of Schizophrenia and schizoaffective disorder. He is alert and oriented X 4  His speech is clear. Thought process seems normal. He is talkative, sometimes impulsive, restless, agitated, irritable, and distractive. He denies use of illicit drugs and alcohol. He denies AVH but the way he talks and behavior exhibited all indicate that he is positive for psychotic symptom. Denied SI/HI. Hygiene, poor related to his homelessness. Skin integrity intact. He is not violent. He is cooperative although he sounds as if he were aggressive. Inially he refused his medication saying I am allergic to Haldol  and Lorazepam  but when NP pressed that he is not he complied. He requested for Trazodone , medication administered to him. He didn't eat much of the snack offered to him. Finally went to bed and fell asleep. We continue caring.

## 2024-03-04 DIAGNOSIS — F200 Paranoid schizophrenia: Secondary | ICD-10-CM | POA: Diagnosis not present

## 2024-03-04 MED ORDER — BENZTROPINE MESYLATE 0.5 MG PO TABS
0.5000 mg | ORAL_TABLET | Freq: Two times a day (BID) | ORAL | Status: DC
  Administered 2024-03-04 – 2024-03-05 (×2): 0.5 mg via ORAL
  Filled 2024-03-04 (×2): qty 1

## 2024-03-04 MED ORDER — IPRATROPIUM BROMIDE 0.06 % NA SOLN: 2.0000 | Freq: Two times a day (BID) | NASAL | Status: DC

## 2024-03-04 MED ORDER — IPRATROPIUM BROMIDE 0.03 % NA SOLN
2.0000 | Freq: Two times a day (BID) | NASAL | Status: DC
  Administered 2024-03-04: 2 via NASAL
  Filled 2024-03-04: qty 30

## 2024-03-04 NOTE — ED Provider Notes (Addendum)
 Behavioral Health Progress Note  Date and Time: 03/04/2024 1:01 PM Name: Carlos [name] MRN:  [id number]  HPI: As per initial assessment: Carlos [name]. Carlos [name] is a 33 y/o male, who is homeless, with past psychiatric significant for schizophrenia, PTSD, polysubstance use, and medication noncompliance presented voluntarily to [facility] as a walk in un accompanied by with complaints of homeless and wanting to get back on his medications.   Patient assessment, 087/10/2023:  During encounter today, paranoia is persistent patient talks about being followed by people outside of this facility, is adamant that there are people outside of this building wanting to harm him, but that they are unable to come inside of the building. He is disorganized, presents with flight of ideas, jumps from topic to topic, is unable to focus on one topic at a time. He is tangential, Primary school teacher if she knows [place], [place], and other names that he utters. He repeatedly states to Clinical research associate you do know them. Don't be acting like you don't. He is argumentative, states he will not be taking any Haldol  due to sialorrhea, yet is not wanting to provide names of any other medications that he has taken in the past that has actually helped with his psychosis.  Patient is non linear in responses to questions for most of the assessment. He states I'm way behind. I took the [place] from [place] to here, and I am homeless, when asked about medications which have helped him in the past.   Patient however denies SI, denies HI, denies AVH, but is very paranoid about people being out to harm him. He is continuing to meet criteria for today for inpatient behavioral health treatment. If current mental health is not treated, it will deteriorate further.  Medications adjustments for today: - Ipratropium nasal sprays BID for treatment of sialorrhea related to Haldol . -Decrease Cogentin  from 1mg  BID to 0.5 mg BID for EPS prophylaxis.  We will  continue Haldol  for now, and writer has reached out to [facility] regarding an inpatient bed, and while there might be a bed opening at the [facility] later today, [facility] is also recommending for patient to be faxed out to other facilities for now. We are continuing to monitor.  Labs Reviewed: Ordered Ha1c & TSH as well as baseline EKG.   Diagnosis:  Final diagnoses:  Paranoid schizophrenia (HCC)  PTSD (post-traumatic stress disorder)    Total Time spent with patient: 45 minutes  Past Psychiatric History: Schizophrenia  Past Medical History: denies  Family History: not provided Family Psychiatric  History: not provided Social History: homelessness, no income per patient   Pain Medications: See  MAR Prescriptions: See MAR Over the Counter: See MAR History of alcohol / drug use?:  (UTA) Longest period of sobriety (when/how long): UTA Negative Consequences of Use:  (UTA) Withdrawal Symptoms: Other (Comment) (UTA)    Sleep: Fair  Appetite:  Good  Current Medications:  Current Facility-Administered Medications  Medication Dose Route Frequency Provider Last Rate Last Admin   acetaminophen  (TYLENOL ) tablet 650 mg  650 mg Oral Q6H PRN Bobbitt, Shalon E, NP       alum & mag hydroxide-simeth (MAALOX/MYLANTA) 200-200-20 MG/5ML suspension 30 mL  30 mL Oral Q4H PRN Bobbitt, Shalon E, NP       benztropine  (COGENTIN ) tablet 0.5 mg  0.5 mg Oral BID Shrita Thien, NP       haloperidol  (HALDOL ) tablet 5 mg  5 mg Oral TID PRN Bobbitt, Shalon E, NP  And   diphenhydrAMINE  (BENADRYL ) capsule 50 mg  50 mg Oral TID PRN Bobbitt, Shalon E, NP       haloperidol  lactate (HALDOL ) injection 5 mg  5 mg Intramuscular TID PRN Bobbitt, Shalon E, NP       And   diphenhydrAMINE  (BENADRYL ) injection 50 mg  50 mg Intramuscular TID PRN Bobbitt, Shalon E, NP       And   LORazepam  (ATIVAN ) injection 2 mg  2 mg Intramuscular TID PRN Bobbitt, Shalon E, NP       haloperidol  lactate (HALDOL ) injection 10 mg  10 mg  Intramuscular TID PRN Bobbitt, Shalon E, NP       And   diphenhydrAMINE  (BENADRYL ) injection 50 mg  50 mg Intramuscular TID PRN Bobbitt, Shalon E, NP       And   LORazepam  (ATIVAN ) injection 2 mg  2 mg Intramuscular TID PRN Bobbitt, Shalon E, NP       haloperidol  (HALDOL ) tablet 5 mg  5 mg Oral BID Bobbitt, Shalon E, NP   5 mg at 03/03/24 2143   hydrOXYzine  (ATARAX ) tablet 25 mg  25 mg Oral TID PRN Bobbitt, Shalon E, NP       ipratropium (ATROVENT ) 0.03 % nasal spray 2 spray  2 spray Each Nare BID Germaine Shenker, NP       magnesium  hydroxide (MILK OF MAGNESIA) suspension 30 mL  30 mL Oral Daily PRN Bobbitt, Shalon E, NP       traZODone  (DESYREL ) tablet 50 mg  50 mg Oral QHS PRN Bobbitt, Shalon E, NP   50 mg at 03/03/24 2143   Current Outpatient Medications  Medication Sig Dispense Refill   ARIPiprazole  ER (ABILIFY  MAINTENA) 400 MG SRER injection Inject 2 mLs (400 mg total) into the muscle every 28 (twenty-eight) days. Due next 10/14 (Patient not taking: Reported on 10/29/2022) 1 each 11   benztropine  (COGENTIN ) 1 MG tablet Take 1 tablet (1 mg total) by mouth 2 (two) times daily. (Patient not taking: Reported on 10/29/2022) 60 tablet 2   doxycycline  (VIBRAMYCIN ) 100 MG capsule Take 1 capsule (100 mg total) by mouth 2 (two) times daily. (Patient not taking: Reported on 03/26/2023) 14 capsule 0   gabapentin  (NEURONTIN ) 400 MG capsule Take 1 capsule (400 mg total) by mouth 3 (three) times daily. (Patient not taking: Reported on 10/29/2022) 90 capsule 2   haloperidol  (HALDOL ) 20 MG tablet Take 1 tablet (20 mg total) by mouth at bedtime. (Patient not taking: Reported on 10/29/2022) 90 tablet 1   temazepam  (RESTORIL ) 30 MG capsule Take 1 capsule (30 mg total) by mouth at bedtime as needed for sleep. (Patient not taking: Reported on 10/29/2022) 30 capsule 0    Labs  Lab Results:  Admission on 03/03/2024  Component Date Value Ref Range Status   WBC 03/03/2024 5.2  4.0 - 10.5 K/uL Final   RBC 03/03/2024  4.84  4.22 - 5.81 MIL/uL Final   Hemoglobin 03/03/2024 14.4  13.0 - 17.0 g/dL Final   HCT 91/97/7974 43.6  39.0 - 52.0 % Final   MCV 03/03/2024 90.1  80.0 - 100.0 fL Final   MCH 03/03/2024 29.8  26.0 - 34.0 pg Final   MCHC 03/03/2024 33.0  30.0 - 36.0 g/dL Final   RDW 91/97/7974 13.5  11.5 - 15.5 % Final   Platelets 03/03/2024 211  150 - 400 K/uL Final   nRBC 03/03/2024 0.0  0.0 - 0.2 % Final   Neutrophils Relative % 03/03/2024 62  %  Final   Neutro Abs 03/03/2024 3.2  1.7 - 7.7 K/uL Final   Lymphocytes Relative 03/03/2024 26  % Final   Lymphs Abs 03/03/2024 1.4  0.7 - 4.0 K/uL Final   Monocytes Relative 03/03/2024 8  % Final   Monocytes Absolute 03/03/2024 0.4  0.1 - 1.0 K/uL Final   Eosinophils Relative 03/03/2024 3  % Final   Eosinophils Absolute 03/03/2024 0.2  0.0 - 0.5 K/uL Final   Basophils Relative 03/03/2024 1  % Final   Basophils Absolute 03/03/2024 0.1  0.0 - 0.1 K/uL Final   Immature Granulocytes 03/03/2024 0  % Final   Abs Immature Granulocytes 03/03/2024 0.01  0.00 - 0.07 K/uL Final   Abs Granulocyte 03/03/2024 3.2  1.5 - 6.5 K/uL Final   Performed at [facility], [address] [place], [place] [address]   Sodium 03/03/2024 140  135 - 145 mmol/L Final   Potassium 03/03/2024 4.3  3.5 - 5.1 mmol/L Final   Chloride 03/03/2024 110  98 - 111 mmol/L Final   CO2 03/03/2024 20 (L)  22 - 32 mmol/L Final   Glucose, Bld 03/03/2024 59 (L)  70 - 99 mg/dL Final   Glucose reference range applies only to samples taken after fasting for at least 8 hours.   BUN 03/03/2024 16  6 - 20 mg/dL Final   Creatinine, Ser 03/03/2024 1.08  0.61 - 1.24 mg/dL Final   Calcium 91/97/7974 9.1  8.9 - 10.3 mg/dL Final   Total Protein 91/97/7974 6.0 (L)  6.5 - 8.1 g/dL Final   Albumin 91/97/7974 3.6  3.5 - 5.0 g/dL Final   AST 91/97/7974 27  15 - 41 U/L Final   ALT 03/03/2024 26  0 - 44 U/L Final   Alkaline Phosphatase 03/03/2024 54  38 - 126 U/L Final   Total Bilirubin 03/03/2024 1.5 (H)  0.0  - 1.2 mg/dL Final   GFR, Estimated 03/03/2024 >60  >60 mL/min Final   Comment: (NOTE) Calculated using the CKD-EPI Creatinine Equation (2021)    Anion gap 03/03/2024 10  5 - 15 Final   Performed at [facility], [address] [place], [place] [address]   Alcohol, Ethyl (B) 03/03/2024 <15  <15 mg/dL Final   Comment: (NOTE) For medical purposes only. Performed at [facility], [address] [place], [place] [address]    Cholesterol 03/03/2024 162  0 - 200 mg/dL Final   Triglycerides 91/97/7974 21  <150 mg/dL Final   HDL 91/97/7974 71  >40 mg/dL Final   Total CHOL/HDL Ratio 03/03/2024 2.3  RATIO Final   VLDL 03/03/2024 4  0 - 40 mg/dL Final   LDL Cholesterol 03/03/2024 87  0 - 99 mg/dL Final   Comment:        Total Cholesterol/HDL:CHD Risk Coronary Heart Disease Risk Table                     Men   Women  1/2 Average Risk   3.4   3.3  Average Risk       5.0   4.4  2 X Average Risk   9.6   7.1  3 X Average Risk  23.4   11.0        Use the calculated Patient Ratio above and the CHD Risk Table to determine the patient's CHD Risk.        ATP III CLASSIFICATION (LDL):  <100     mg/dL  Optimal  100-129  mg/dL   Near or Above                    Optimal  130-159  mg/dL   Borderline  839-810  mg/dL   High  >809     mg/dL   Very High Performed at [facility], [address], [place], [place] [address]    Magnesium  03/03/2024 2.2  1.7 - 2.4 mg/dL Final   Performed at [facility], [address], [place], [place] [address]   TSH 03/03/2024 1.752  0.350 - 4.500 uIU/mL Final   Comment: Performed by a 3rd Generation assay with a functional sensitivity of <=0.01 uIU/mL. Performed at [facility], [address], [place], [place] [address]    POC Amphetamine UR 03/03/2024 None Detected  NONE DETECTED (Cut Off Level 1000 ng/mL) Final   POC Secobarbital (BAR) 03/03/2024 None Detected  NONE DETECTED (Cut Off Level 300 ng/mL) Final   POC Buprenorphine  (BUP) 03/03/2024 None Detected  NONE DETECTED (Cut Off Level 10 ng/mL) Final   POC Oxazepam (BZO) 03/03/2024 None Detected  NONE DETECTED (Cut Off Level 300 ng/mL) Final   POC Cocaine UR 03/03/2024 None Detected  NONE DETECTED (Cut Off Level 300 ng/mL) Final   POC Methamphetamine UR 03/03/2024 None Detected  NONE DETECTED (Cut Off Level 1000 ng/mL) Final   POC Morphine 03/03/2024 None Detected  NONE DETECTED (Cut Off Level 300 ng/mL) Final   POC Methadone UR 03/03/2024 None Detected  NONE DETECTED (Cut Off Level 300 ng/mL) Final   POC Oxycodone UR 03/03/2024 None Detected  NONE DETECTED (Cut Off Level 100 ng/mL) Final   POC Marijuana UR 03/03/2024 Positive (A)  NONE DETECTED (Cut Off Level 50 ng/mL) Final    Blood Alcohol level:  Lab Results  Component Value Date   [facility] <15 03/03/2024   ETH <10 04/11/2019    Metabolic Disorder Labs: Lab Results  Component Value Date   HGBA1C 5.3 10/29/2022   MPG 105 10/29/2022   MPG 108 08/29/2015   Lab Results  Component Value Date   PROLACTIN 113.1 (H) 08/29/2015   Lab Results  Component Value Date   CHOL 162 03/03/2024   TRIG 21 03/03/2024   HDL 71 03/03/2024   CHOLHDL 2.3 03/03/2024   VLDL 4 03/03/2024   LDLCALC 87 03/03/2024   LDLCALC 86 10/29/2022    Therapeutic Lab Levels: No results found for: LITHIUM Lab Results  Component Value Date   VALPROATE <10 (L) 08/24/2015   VALPROATE <10 (L) 08/22/2015   Lab Results  Component Value Date   CBMZ 8.7 05/05/2016    Physical Findings   AIMS    Flowsheet Row [facility]) [facility]  AIMS Total Score 0 0   AUDIT    Flowsheet Row [facility]) [facility] Admission (Discharged) [facility] Admission (Discharged) from  [facility]  Alcohol Use Disorder Identification Test Final Score (AUDIT) 0 0 3   Flowsheet Row ED [facility]03[facility]2024 [facility] from 01/06/2024 in [facility] ED from 04/14/2023 in [facility]  C-SSRS RISK CATEGORY Low Risk Low Risk No Risk     Musculoskeletal  Strength & Muscle  Tone: within normal limits Gait & Station: normal Patient leans: N/A  Psychiatric Specialty Exam  Presentation  General Appearance:  Fairly Groomed  Eye Contact: Fair  Speech: Pressured  Speech Volume: Normal  Handedness: Right   Mood and Affect  Mood: Anxious; Dysphoric  Affect: Congruent   Thought Process  Thought Processes: Disorganized  Descriptions of Associations:Intact  Orientation:Partial  Thought Content:Illogical  Diagnosis of Schizophrenia or Schizoaffective disorder in past: Yes  Duration of Psychotic Symptoms: Greater than six months   Hallucinations:Hallucinations: None Description of Visual Hallucinations: seeing machines going down the street talking  Ideas of Reference:Paranoia; Delusions; Percusatory  Suicidal Thoughts:Suicidal Thoughts: No SI Passive Intent and/or Plan: With Intent; Without Plan  Homicidal Thoughts:Homicidal Thoughts: No   Sensorium  Memory: Immediate Poor  Judgment: Poor  Insight: Poor   Executive Functions  Concentration: Poor  Attention Span: Poor  Recall: Poor  Fund of Knowledge: Poor  Language: Poor   Psychomotor Activity  Psychomotor Activity: Psychomotor Activity: Normal   Assets  Assets: Resilience   Sleep  Sleep: Sleep: Fair  No Safety Checks orders active in given range  Nutritional Assessment (For OBS and FBC admissions only) Has the patient had a weight loss or gain of 10 pounds or more in the last 3 months?: No Has the patient had a decrease in food  intake/or appetite?: No Does the patient have dental problems?: No Does the patient have eating habits or behaviors that may be indicators of an eating disorder including binging or inducing vomiting?: No Has the patient recently lost weight without trying?: 0 Has the patient been eating poorly because of a decreased appetite?: 0 Malnutrition Screening Tool Score: 0   Physical Exam  Physical Exam Review of Systems  Psychiatric/Behavioral:  Positive for hallucinations and substance abuse. Negative for depression, memory loss and suicidal ideas. The patient is nervous/anxious and has insomnia.   All other systems reviewed and are negative.  Blood pressure 95/63, pulse 65, temperature 98.4 F (36.9 C), temperature source Oral, resp. rate 16, SpO2 97%. There is no height or weight on file to calculate BMI.  Treatment Plan Summary: Daily contact with patient to assess and evaluate symptoms and progress in treatment and Medication management  -Notified nursing to check vitals for today. Last BP yesterday was 95/63. -Encouraged hydration  Medication Adjustments for today: -Decrease Cogentin  from 1mg  BID to 0.5 mg BID for EPS prophylaxis -Continue Haldol  5 mg BID for psychosis  Donia Snell, NP 03/04/2024 1:01 PM

## 2024-03-04 NOTE — ED Notes (Signed)
 Pt currently awake, ambulatory. Pt provided with breakfast and hygiene supplies for shower.

## 2024-03-04 NOTE — ED Notes (Signed)
 Pt is asleep in recliner. Respirations are even and unlabored. No distress noted. Staff will continue to monitor pt for safety.

## 2024-03-04 NOTE — Progress Notes (Signed)
 [facility] LCSW Progress Note   03/04/2024    3:32 PM  Carlos [name]   [id number]   Type of Contact and Topic:  Psychiatric Bed Placement   Pt accepted to [name]   Patient meets inpatient criteria per Donia Snell, NP  The attending provider will be Dr. Jess   Call report to [phone]  Tinnie Netters, RN @ [facility] notified.     Pt scheduled  to arrive at [name].    Aleli Navedo, MSW, LCSW-A  3:33 PM 03/04/2024

## 2024-03-04 NOTE — ED Notes (Signed)
 Patient observed/assessed in bed/chair resting quietly appearing in no distress and verbalizing no complaints at this time. Will continue to monitor.

## 2024-03-04 NOTE — ED Notes (Signed)
 Patient does not want to take Haldol . He said, I don't want Haldol  how many times I have to tell you.  It took him a while before swallowing it. Continued saying I want Trazodone  not Haldol . Since you gave Haldol  I don't Trazodone  now. Trazodone  was PRN not scheduled medication. Behaviorally he is somewhat uncooperative. Quiet and calm no aggressiveness observed. Denied SI/HI. Denied audiovisual hallucination. At this time no other issue observed.

## 2024-03-04 NOTE — ED Notes (Signed)
 RN and MHT encourage pt to take scheduled medications including haldol  and cogentin . Provider made aware. Pt presents as irritable and somewhat confused.

## 2024-03-04 NOTE — ED Notes (Signed)
 Pt provided with lunch and flip flops as requested. Pt continues to appears somewhat anxious, but is currently cooperative. Pt observed to be responding to internal stimuli via hand gestures while sitting by himself.

## 2024-03-04 NOTE — Progress Notes (Signed)
 Patient has been denied by [facility] due to no appropriate beds available. Patient meets BH inpatient criteria per Donia Snell, NP. Patient has been faxed out to the following facilities:   [facility]  [address] [place] [place] [address] [phone]  [facility]  [address] [place] [address] [phone]  [facility]  [address] [place] [address] [phone]  [facility] [address]  [address] [place] [address] [phone]  [facility] Patient Placement  [facility] [place] [phone]  [facility]  [address] [place], [place] [address] [phone]  [facility]  [address], [place] [address] [phone]  [facility]  [address] [place], [place] [phone]  [facility]  [address] [place] [address] [phone]  [facility]  [address] [place] [address] [phone]  [facility]  [address] [place] [address] [phone]  [facility]  [address] [place], [place] [address] [phone]  [facility]  [address] [place] [address] [phone]  [facility]  [address], [place] [address] [phone]  [facility]  [address] [place] [address] [phone]  [facility]  [address], [place] [address] [phone]  [facility]  [address] [place] [address]  [phone]   Bunnie Gallop, MSW, LCSW-A  3:13 PM 03/04/2024

## 2024-03-05 DIAGNOSIS — F4310 Post-traumatic stress disorder, unspecified: Secondary | ICD-10-CM

## 2024-03-05 DIAGNOSIS — F200 Paranoid schizophrenia: Secondary | ICD-10-CM | POA: Diagnosis not present

## 2024-03-05 NOTE — ED Notes (Signed)
 Patient is awake sitting on his bed.. Prn medication Trazodone  offered but refused. It appears that he has sleep disturbances. He is not telling his problem except saying no

## 2024-03-05 NOTE — ED Notes (Signed)
 Per NP Doris patient will be placed under IVC prior to transport to [facility] due to requesting to leave. [facility] nurse Clotilda updated. Awaiting paperwork prior to calling for transport.

## 2024-03-05 NOTE — ED Notes (Signed)
 Patient resting in lounger with eyes closed, respirations even and unlabored. Patient in no apparent acute distress. Environment secured. Safety checks in place per facility protocol.

## 2024-03-05 NOTE — ED Provider Notes (Signed)
 FBC/OBS ASAP Discharge Summary  Date and Time: 03/05/2024 4:31 PM  Name: Carlos [name]  MRN:  [id number]   Discharge Diagnoses:  Final diagnoses:  Paranoid schizophrenia [facility])  PTSD (post-traumatic stress disorder)   HPI: As per initial assessment: Carlos [name] is a 33 y/o male, who is homeless, with past psychiatric significant for schizophrenia, PTSD, polysubstance use, and medication noncompliance presented voluntarily to [facility] as a walk in un accompanied by with complaints of homeless and wanting to get back on his medications.   Stay Summary: Patient was admitted to the observation area, started on Haldol  5 mg BID for psychosis, had some resistance with taking this medication. He complained of Sialorrhea related to Haldol  and was prescribed Atrovent  nasal spray to help Mitigate this, but pt refused to take hit. Patient was recommended for inpatient treatment, but put in his 72 hr notice for discharge on 8/3 since he was a voluntary patient at that time. It became necessary to pursue an involuntary petition for him since his mental status with his paranoia renders him at a higher risk of danger to self and others since his paranoia has remained persistent since admission. IVC petition granted, and pt accepted by [facility] and is being transferred there today for a higher level of care.  Total Time spent with patient: 45 minutes  Past Psychiatric History: Please see H & P Past Medical History: Please see H & P Family History: Please see H & P Family Psychiatric History: Please see H & P Social History: Please see H & P Tobacco Cessation:  A prescription for an FDA-approved tobacco cessation medication was offered at discharge and the patient refused  Current Medications:  Current Facility-Administered Medications  Medication Dose Route Frequency Provider Last Rate Last Admin   acetaminophen  (TYLENOL ) tablet 650 mg  650 mg Oral Q6H PRN Bobbitt, Shalon E, NP        alum & mag hydroxide-simeth (MAALOX/MYLANTA) 200-200-20 MG/5ML suspension 30 mL  30 mL Oral Q4H PRN Bobbitt, Shalon E, NP       benztropine  (COGENTIN ) tablet 0.5 mg  0.5 mg Oral BID Tex Drilling, NP   0.5 mg at 03/05/24 9070   haloperidol  (HALDOL ) tablet 5 mg  5 mg Oral TID PRN Bobbitt, Shalon E, NP       And   diphenhydrAMINE  (BENADRYL ) capsule 50 mg  50 mg Oral TID PRN Bobbitt, Shalon E, NP       haloperidol  lactate (HALDOL ) injection 5 mg  5 mg Intramuscular TID PRN Bobbitt, Shalon E, NP       And   diphenhydrAMINE  (BENADRYL ) injection 50 mg  50 mg Intramuscular TID PRN Bobbitt, Shalon E, NP       And   LORazepam  (ATIVAN ) injection 2 mg  2 mg Intramuscular TID PRN Bobbitt, Shalon E, NP       haloperidol  lactate (HALDOL ) injection 10 mg  10 mg Intramuscular TID PRN Bobbitt, Shalon E, NP       And   diphenhydrAMINE  (BENADRYL ) injection 50 mg  50 mg Intramuscular TID PRN Bobbitt, Shalon E, NP       And   LORazepam  (ATIVAN ) injection 2 mg  2 mg Intramuscular TID PRN Bobbitt, Shalon E, NP       haloperidol  (HALDOL ) tablet 5 mg  5 mg Oral BID Bobbitt, Shalon E, NP   5 mg at 03/05/24 0929   hydrOXYzine  (ATARAX ) tablet 25 mg  25 mg Oral TID PRN Bobbitt, Shalon  E, NP       ipratropium (ATROVENT ) 0.03 % nasal spray 2 spray  2 spray Each Nare BID Tex Drilling, NP   2 spray at 03/04/24 1514   magnesium  hydroxide (MILK OF MAGNESIA) suspension 30 mL  30 mL Oral Daily PRN Bobbitt, Shalon E, NP       traZODone  (DESYREL ) tablet 50 mg  50 mg Oral QHS PRN Bobbitt, Shalon E, NP   50 mg at 03/03/24 2143   Current Outpatient Medications  Medication Sig Dispense Refill   ARIPiprazole  ER (ABILIFY  MAINTENA) 400 MG SRER injection Inject 2 mLs (400 mg total) into the muscle every 28 (twenty-eight) days. Due next 10/14 (Patient not taking: Reported on 10/29/2022) 1 each 11   benztropine  (COGENTIN ) 1 MG tablet Take 1 tablet (1 mg total) by mouth 2 (two) times daily. (Patient not taking: Reported on 10/29/2022) 60  tablet 2   doxycycline  (VIBRAMYCIN ) 100 MG capsule Take 1 capsule (100 mg total) by mouth 2 (two) times daily. (Patient not taking: Reported on 03/26/2023) 14 capsule 0   gabapentin  (NEURONTIN ) 400 MG capsule Take 1 capsule (400 mg total) by mouth 3 (three) times daily. (Patient not taking: Reported on 10/29/2022) 90 capsule 2   haloperidol  (HALDOL ) 20 MG tablet Take 1 tablet (20 mg total) by mouth at bedtime. (Patient not taking: Reported on 10/29/2022) 90 tablet 1   temazepam  (RESTORIL ) 30 MG capsule Take 1 capsule (30 mg total) by mouth at bedtime as needed for sleep. (Patient not taking: Reported on 10/29/2022) 30 capsule 0    PTA Medications:  Facility Ordered Medications  Medication   [COMPLETED] LORazepam  (ATIVAN ) tablet 2 mg   [COMPLETED] haloperidol  (HALDOL ) tablet 5 mg   acetaminophen  (TYLENOL ) tablet 650 mg   alum & mag hydroxide-simeth (MAALOX/MYLANTA) 200-200-20 MG/5ML suspension 30 mL   magnesium  hydroxide (MILK OF MAGNESIA) suspension 30 mL   haloperidol  (HALDOL ) tablet 5 mg   And   diphenhydrAMINE  (BENADRYL ) capsule 50 mg   haloperidol  lactate (HALDOL ) injection 5 mg   And   diphenhydrAMINE  (BENADRYL ) injection 50 mg   And   LORazepam  (ATIVAN ) injection 2 mg   haloperidol  lactate (HALDOL ) injection 10 mg   And   diphenhydrAMINE  (BENADRYL ) injection 50 mg   And   LORazepam  (ATIVAN ) injection 2 mg   traZODone  (DESYREL ) tablet 50 mg   hydrOXYzine  (ATARAX ) tablet 25 mg   haloperidol  (HALDOL ) tablet 5 mg   ipratropium (ATROVENT ) 0.03 % nasal spray 2 spray   benztropine  (COGENTIN ) tablet 0.5 mg   PTA Medications  Medication Sig   ARIPiprazole  ER (ABILIFY  MAINTENA) 400 MG SRER injection Inject 2 mLs (400 mg total) into the muscle every 28 (twenty-eight) days. Due next 10/14 (Patient not taking: Reported on 10/29/2022)   haloperidol  (HALDOL ) 20 MG tablet Take 1 tablet (20 mg total) by mouth at bedtime. (Patient not taking: Reported on 10/29/2022)   temazepam  (RESTORIL ) 30 MG  capsule Take 1 capsule (30 mg total) by mouth at bedtime as needed for sleep. (Patient not taking: Reported on 10/29/2022)   benztropine  (COGENTIN ) 1 MG tablet Take 1 tablet (1 mg total) by mouth 2 (two) times daily. (Patient not taking: Reported on 10/29/2022)   gabapentin  (NEURONTIN ) 400 MG capsule Take 1 capsule (400 mg total) by mouth 3 (three) times daily. (Patient not taking: Reported on 10/29/2022)   doxycycline  (VIBRAMYCIN ) 100 MG capsule Take 1 capsule (100 mg total) by mouth 2 (two) times daily. (Patient not taking: Reported on 03/26/2023)  No data to display          Flowsheet Row ED from [facility]/03/2024 in [facility] ED from 01/06/2024 in [facility] ED from [facility]/14/2023 in [facility]  C-SSRS RISK CATEGORY Low Risk Low Risk No Risk    Musculoskeletal  Strength & Muscle Tone: within normal limits Gait & Station: normal Patient leans: N/A  Psychiatric Specialty Exam  Presentation  General Appearance:  Fairly Groomed  Eye Contact: Fair  Speech: Clear and Coherent  Speech Volume: Normal  Handedness: Right   Mood and Affect  Mood: Depressed; Anxious  Affect: Congruent   Thought Process  Thought Processes: Coherent  Descriptions of Associations:Intact  Orientation:Full (Time, Place and Person)  Thought Content:Logical  Diagnosis of Schizophrenia or Schizoaffective disorder in past: Yes  Duration of Psychotic Symptoms: Greater than six months   Hallucinations:Hallucinations: Auditory  Ideas of Reference:Paranoia  Suicidal Thoughts:Suicidal Thoughts: No  Homicidal Thoughts:Homicidal Thoughts: No   Sensorium  Memory: Recent Poor  Judgment: Poor  Insight: Poor   Executive Functions  Concentration: Poor  Attention Span: Poor  Recall: Poor  Fund of Knowledge: Poor  Language: Fair   Psychomotor Activity  Psychomotor Activity: Psychomotor  Activity: Normal   Assets  Assets: Resilience   Sleep  Sleep: Sleep: Fair  No Safety Checks orders active in given range  No data recorded  Physical Exam  Physical Exam Constitutional:      Appearance: Normal appearance.  HENT:     Head: Normocephalic.  Eyes:     Pupils: Pupils are equal, round, and reactive to light.  Musculoskeletal:        General: Normal range of motion.     Cervical back: Normal range of motion.  Neurological:     General: No focal deficit present.     Mental Status: He is alert and oriented to person, place, and time.    Review of Systems  Psychiatric/Behavioral:  Positive for hallucinations. Negative for depression, memory loss, substance abuse and suicidal ideas. The patient is nervous/anxious and has insomnia.   All other systems reviewed and are negative.  Blood pressure 115/69, pulse 83, temperature 98.6 F (37 C), resp. rate 18, SpO2 97%. There is no height or weight on file to calculate BMI.  Demographic Factors:  Male and Low socioeconomic status  Loss Factors: Decrease in vocational status  Historical Factors: Impulsivity  Risk Reduction Factors:   NA  Continued Clinical Symptoms:  Unstable or Poor Therapeutic Relationship  Cognitive Features That Contribute To Risk:  Thought constriction (tunnel vision)    Suicide Risk:  Mild:  Suicidal ideation of limited frequency, intensity, duration, and specificity.  There are no identifiable plans, no associated intent, mild dysphoria and related symptoms, good self-control (both objective and subjective assessment), few other risk factors, and identifiable protective factors, including available and accessible social support.  Plan Of Care/Follow-up recommendations:  Other:  Being transferred to [facility] for a higher level of care.  Donia Snell, NP 03/05/2024, 4:31 PM

## 2024-03-05 NOTE — ED Notes (Signed)
 Patient provided breakfast

## 2024-03-05 NOTE — ED Notes (Signed)
 Patient transferred to [facility].  Patient discharged in no acute distress, A& O x4 and ambulatory. Patient denied SI/HI, A/VH upon discharge. Patient mood fair. Patient currently under IVC status, papers given to [facility] at time of transport. Patient belongings given to transport service from locker #27 complete and intact. Patient escorted to back sallyport via staff for transport to destination. Safety maintained.

## 2024-03-05 NOTE — ED Notes (Signed)
 Patient has sleep disturbances. He suffering from sleep wake cycle. He has all depression symptoms. However, he is not violent or aggressive.

## 2024-03-05 NOTE — ED Notes (Signed)
 Patient resting in lounger. Calm, collected, no physical complaints at this time. Patient in no apparent acute distress. No inappropriate behaviors observed or reported at this time. Environment secured. Safety checks in place per facility protocol.

## 2024-03-05 NOTE — ED Notes (Signed)
 Patient requested to speak with writer regarding their stay. Patient states he does not understand how he can sign a 72 hour request for discharge and then be told he is being placed under an IVC and transferred all the way over to [place]. Patient expressed that he had appointments for housing and jobs lined up and needed to attend those. Writer offered to find these contact numbers for him to see if anything can be rescheduled. Patient stated why would I reschedule what has already been scheduled?. Patient states he came here for anxiety ad psychosis and that we gave him meds and that he is better now. Patient states he should be able to leave if he wants. Writer tried to explain that orders are needed for patient's to discharge and that decisions regarding IVC vs VOL are made based off of the providers' assessment. Patient continued to voice disagreement with IVC decision, often interrupting and speaking over Clinical research associate when explantations were provider. Patient stated he would like to speak with the provider again regarding this decision. Provider Donia Snell, NP made aware.

## 2024-03-05 NOTE — ED Notes (Signed)
 Lunch provided, TV turned on with appropriate content for entertainment. Environment secured, safety checks in place per facility policy.

## 2024-03-05 NOTE — ED Notes (Signed)
 Patient alert & oriented x4. Denies intent to harm self or others when asked. Denies A/VH. Patient denies any physical complaints when asked. No acute distress noted. Scheduled PO medications administered with no complications after explanation of medication schedule, patient refused nasal spray at this time stating I'm breathing fine right now. Support and encouragement provided. Patient observed in milieu. Patient frequently comes to nurses station and stands at desk without speaking and then walks back to recliner, no complaints at this time. Patient has asked several times when he is leaving, Clinical research associate informed patient as soon as I know of a time I will update him. Patient nodded head in understanding. Patient has been accepted to [facility] for later today. Currently awaiting for patient to be assessed prior to calling report for an updated provider note to send with patient upon transfer. No inappropriate behaviors seen or reported. Patient and Clinical research associate talked about a paper crane that patient has at bedside, patient states it was gifted to him by another patient and he has named it Estate agent. Writer complimented name and stated we all should have some luck. Routine safety checks conducted per facility protocol. Encouraged patient to notify staff if any thoughts of harm towards self or others arise. Patient verbalizes understanding and agreement.

## 2024-03-05 NOTE — ED Notes (Addendum)
 Patient observed in milieu speaking to staff regarding his planned admission to [facility]. Patient calm and composed at this time. Environment secured, safety checks in place per facility policy.

## 2024-07-23 ENCOUNTER — Emergency Department (HOSPITAL_COMMUNITY)
Admission: EM | Admit: 2024-07-23 | Discharge: 2024-07-25 | Disposition: A | Discharge status: Other Acute Inpatient Hospital | Attending: Emergency Medicine | Admitting: Emergency Medicine

## 2024-07-23 ENCOUNTER — Other Ambulatory Visit: Payer: Self-pay

## 2024-07-23 ENCOUNTER — Encounter (HOSPITAL_COMMUNITY): Payer: Self-pay

## 2024-07-23 DIAGNOSIS — R440 Auditory hallucinations: Secondary | ICD-10-CM | POA: Diagnosis present

## 2024-07-23 DIAGNOSIS — F29 Unspecified psychosis not due to a substance or known physiological condition: Secondary | ICD-10-CM | POA: Diagnosis not present

## 2024-07-23 DIAGNOSIS — F4310 Post-traumatic stress disorder, unspecified: Secondary | ICD-10-CM | POA: Diagnosis not present

## 2024-07-23 DIAGNOSIS — F23 Brief psychotic disorder: Secondary | ICD-10-CM

## 2024-07-23 DIAGNOSIS — F209 Schizophrenia, unspecified: Secondary | ICD-10-CM | POA: Diagnosis not present

## 2024-07-23 LAB — COMPREHENSIVE METABOLIC PANEL WITH GFR
ALT: 63 U/L — ABNORMAL HIGH (ref 0–44)
AST: 73 U/L — ABNORMAL HIGH (ref 15–41)
Albumin: 4.5 g/dL (ref 3.5–5.0)
Alkaline Phosphatase: 70 U/L (ref 38–126)
Anion gap: 9 (ref 5–15)
BUN: 11 mg/dL (ref 6–20)
CO2: 24 mmol/L (ref 22–32)
Calcium: 9.6 mg/dL (ref 8.9–10.3)
Chloride: 108 mmol/L (ref 98–111)
Creatinine, Ser: 0.99 mg/dL (ref 0.61–1.24)
GFR, Estimated: >60 mL/min
Glucose, Bld: 86 mg/dL (ref 70–99)
Potassium: 4 mmol/L (ref 3.5–5.1)
Sodium: 141 mmol/L (ref 135–145)
Total Bilirubin: 0.7 mg/dL (ref 0.0–1.2)
Total Protein: 7.2 g/dL (ref 6.5–8.1)

## 2024-07-23 LAB — CBC
HCT: 45.2 % (ref 39.0–52.0)
Hemoglobin: 14.7 g/dL (ref 13.0–17.0)
MCH: 29.9 pg (ref 26.0–34.0)
MCHC: 32.5 g/dL (ref 30.0–36.0)
MCV: 91.9 fL (ref 80.0–100.0)
Platelets: 284 K/uL (ref 150–400)
RBC: 4.92 MIL/uL (ref 4.22–5.81)
RDW: 13.3 % (ref 11.5–15.5)
WBC: 6.8 K/uL (ref 4.0–10.5)
nRBC: 0 % (ref 0.0–0.2)

## 2024-07-23 LAB — ETHANOL: Alcohol, Ethyl (B): <15 mg/dL

## 2024-07-23 MED ORDER — HALOPERIDOL LACTATE 5 MG/ML IJ SOLN
2.0000 mg | Freq: Once | INTRAMUSCULAR | Status: DC
  Filled 2024-07-23: qty 1

## 2024-07-23 MED ORDER — OLANZAPINE 5 MG PO TABS
5.0000 mg | ORAL_TABLET | Freq: Every day | ORAL | Status: DC
  Administered 2024-07-23: 5 mg via ORAL
  Filled 2024-07-23: qty 1

## 2024-07-23 NOTE — BH Assessment (Signed)
 Attempted to complete TTS assessment, RN advised that the patient is extremely agitated at this time and is unable to participate in the TTS assessment. Advised RN to reach out when patient is more cooperative and able to participate and we will complete his assessment.

## 2024-07-23 NOTE — ED Notes (Signed)
 Pt is aware he has to change into burgundy scrubs. Scrubs are are at bedside table.

## 2024-07-23 NOTE — ED Notes (Signed)
 Pt transported to [facility] and his belongings are in locker 34. PT was wanded by security

## 2024-07-23 NOTE — ED Notes (Signed)
 EKG was not done before come back to SAPPU as of right now pt refused

## 2024-07-23 NOTE — ED Notes (Signed)
 Pt aggressive in [facility]. Walking though hallway yelling, seeming to respond to internal stimuli,.

## 2024-07-23 NOTE — ED Provider Notes (Signed)
 " Carlos [name]   CSN: [id number] Arrival date & time: 07/23/24  2055     Patient presents with: Psychiatric Evaluation   Carlos [name] is a 33 y.o. male.  {Add pertinent medical, surgical, social history, OB history to HPI:9347} 33 year old male presents to the ER with report of PTSD, with auditory and visual hallucinations, not compliant with medications. Danger to self and others. History of substance abuse.  Patient with rapid tangential speech. States he has checked himself in for mental health for a few weeks, I need mood stabilizers.       Prior to Admission medications  Medication Sig Start Date End Date Taking? Authorizing Provider  ARIPiprazole  ER (ABILIFY  MAINTENA) 400 MG SRER injection Inject 2 mLs (400 mg total) into the muscle every 28 (twenty-eight) days. Due next 10/14 Patient not taking: Reported on 10/29/2022 05/15/19   Malinda Rogue, MD  benztropine  (COGENTIN ) 1 MG tablet Take 1 tablet (1 mg total) by mouth 2 (two) times daily. Patient not taking: Reported on 10/29/2022 04/18/19   Malinda Rogue, MD  doxycycline  (VIBRAMYCIN ) 100 MG capsule Take 1 capsule (100 mg total) by mouth 2 (two) times daily. Patient not taking: Reported on 03/26/2023 01/05/23   Doretha Folks, MD  gabapentin  (NEURONTIN ) 400 MG capsule Take 1 capsule (400 mg total) by mouth 3 (three) times daily. Patient not taking: Reported on 10/29/2022 04/18/19   Malinda Rogue, MD  haloperidol  (HALDOL ) 20 MG tablet Take 1 tablet (20 mg total) by mouth at bedtime. Patient not taking: Reported on 10/29/2022 04/18/19   Malinda Rogue, MD  temazepam  (RESTORIL ) 30 MG capsule Take 1 capsule (30 mg total) by mouth at bedtime as needed for sleep. Patient not taking: Reported on 10/29/2022 04/18/19   Malinda Rogue, MD    Allergies: Depakote [divalproex sodium], Invega [paliperidone er], and Other    Review of Systems Level 5 caveat for psychiatric condition   Updated Vital Signs BP 118/87 (BP Location: Left Arm)   Pulse 87   Temp 99.1 F (37.3 C) (Oral)   Resp 16   SpO2 96%   Physical Exam Vitals and nursing [name] reviewed.  Constitutional:      General: He is not in acute distress.    Appearance: He is well-developed. He is not diaphoretic.  HENT:     Head: Normocephalic and atraumatic.  Pulmonary:     Effort: Pulmonary effort is normal.  Neurological:     Mental Status: He is alert.  Psychiatric:        Mood and Affect: Affect is angry.        Speech: Speech is rapid and pressured and tangential.        Behavior: Behavior is agitated and aggressive.        Thought Content: Thought content is paranoid.     (all labs ordered are listed, but only abnormal results are displayed) Labs Reviewed  COMPREHENSIVE METABOLIC PANEL WITH GFR  ETHANOL  CBC  URINE DRUG SCREEN    EKG: None  Radiology: No results found.  {Document cardiac monitor, telemetry assessment procedure when appropriate:32947} Procedures   Medications Ordered in the ED - No data to display    {Click here for ABCD2, HEART and other calculators REFRESH [name] before signing:1}                              Medical Decision Making Risk  Prescription drug management.   This patient presents to the ED for concern of hallucinations, this involves an extensive number of treatment options, and is a complaint that carries with it a high risk of complications and morbidity.  The differential diagnosis includes psychosis, medication non compliance, substance abuse    Co morbidities / Chronic conditions that complicate the patient evaluation  Schizophrenia, substance induced mood disorder, PTSD, THC use   Additional history obtained:  Additional history obtained from EMR External records from outside source obtained and reviewed including prior admission to [facility] with transfer to [facility] for further treatment 03/03/24   Lab Tests:  I Ordered, and personally  interpreted labs.  The pertinent results include:  ***   Cardiac Monitoring: / EKG:  The patient was maintained on a cardiac monitor.  I personally viewed and interpreted the cardiac monitored which showed an underlying rhythm of: ***   Problem List / ED Course / Critical interventions / Medication management  33 year old male presents with complaint of PTSD, with report of auditory and visual hallucinations. Patient is difficult to assess due to his tangential speech and aggressive behavior. He is agreeable with oral meds and was provided with Zyprexa . He does not have other complaints when I try to obtain further history. IVC papers completed for patient and staff safety. Request behavioral health evaluation.  I ordered medication including Zyprexa     Reevaluation of the patient after these medicines showed that the patient *** I have reviewed the patients home medicines and have made adjustments as needed   Consultations Obtained:  I requested consultation with the behavioral health team,  and discussed lab and imaging findings as well as pertinent plan - they recommend: ***   Social Determinants of Health:  ***   Test / Admission - Considered:  ***   {Document critical care time when appropriate  Document review of labs and clinical decision tools ie CHADS2VASC2, etc  Document your independent review of radiology images and any outside records  Document your discussion with family members, caretakers and with consultants  Document social determinants of health affecting pt's care  Document your decision making why or why not admission, treatments were needed:32947:::1}   Final diagnoses:  None    ED Discharge Orders     None        "

## 2024-07-23 NOTE — ED Triage Notes (Signed)
 Pt. Arrives pov c/o ptsd. Pt. States that he is hearing things and feels like he is going to act aggressively when asked if he had SI/HI. Pt. States that he doesn not need sleeping pills he just needs a mood stabilizers. Pt. Has disorganized speech in triage.

## 2024-07-23 NOTE — ED Notes (Signed)
 Pt has 3 belonging bags with the following items. Green pants, a flannel jacket,a black coat, a black head scarf, a bible, white tennis shoes one shoe with black laces and the other with white laces.

## 2024-07-23 NOTE — ED Notes (Signed)
 Pt was given UA cup. He isn't able to go right now. UA cup left at bedside.

## 2024-07-24 DIAGNOSIS — F4310 Post-traumatic stress disorder, unspecified: Secondary | ICD-10-CM | POA: Diagnosis not present

## 2024-07-24 DIAGNOSIS — F209 Schizophrenia, unspecified: Secondary | ICD-10-CM | POA: Diagnosis not present

## 2024-07-24 MED ORDER — OLANZAPINE 5 MG PO TBDP
5.0000 mg | ORAL_TABLET | Freq: Two times a day (BID) | ORAL | Status: DC
  Filled 2024-07-24: qty 1

## 2024-07-24 MED ORDER — ONDANSETRON HCL 4 MG PO TABS: 4.0000 mg | ORAL_TABLET | Freq: Three times a day (TID) | ORAL | Status: DC | PRN

## 2024-07-24 MED ORDER — HYDROXYZINE HCL 25 MG PO TABS: 25.0000 mg | ORAL_TABLET | Freq: Three times a day (TID) | ORAL | Status: DC | PRN

## 2024-07-24 MED ORDER — IBUPROFEN 200 MG PO TABS: 600.0000 mg | ORAL_TABLET | Freq: Three times a day (TID) | ORAL | Status: DC | PRN

## 2024-07-24 MED ORDER — ALUM & MAG HYDROXIDE-SIMETH 200-200-20 MG/5ML PO SUSP: 30.0000 mL | Freq: Four times a day (QID) | ORAL | Status: DC | PRN

## 2024-07-24 MED ORDER — TRAZODONE HCL 50 MG PO TABS: 50.0000 mg | ORAL_TABLET | Freq: Every evening | ORAL | Status: DC | PRN

## 2024-07-24 NOTE — Progress Notes (Signed)
 Pt has been accepted to [facility] on 07/24/2024  . Unit assignment: [facility]   Pt meets inpatient criteria per Cathaleen Adam, NP   Attending Physician will be  Dr. Jess    Report can be called to: [phone]  Pt can arrive after 8AM  Care Team Notified: Francis Sayres, Paramedic, Cathaleen Adam,  NP

## 2024-07-24 NOTE — Progress Notes (Signed)
 Inpatient Psychiatric Referral  Patient was recommended inpatient per Jadeka Motley-Mangrum,  NP. There are no available beds at [facility], per [facility]. Patient was referred to the following out of network facilities:  Destination  Service Provider Address Phone Fax  [facility]  [address], [place] [address] [phone]  [facility]  [address] [place], [place] [address] [phone]  [facility]  [address] [place] [address] [phone]  [facility]  [address], [place] [address] [phone]  [facility]  [address], [place] [address] [phone]  [facility]  [address], [place] [address] [phone]  [facility]  [address] [place], [place] [phone]  [facility]  [address] [place] [address] [phone]    Situation ongoing, CSW to continue following and update chart as more information becomes available.   Harrie Sofia MSW, [place] 07/24/2024

## 2024-07-24 NOTE — ED Notes (Signed)
 We entered his room to obtain vital signs and give medications. The patient commented saying yall are gonna get on my nerves with all of this-refused to let us  get his vital signs. He also refused to take his ordered and due medication

## 2024-07-24 NOTE — ED Notes (Signed)
 Patient has been sleeping until now since I have been on shift. I asked if he would take his scheduled medication. He stated he didn't want to take it because he felt like he has been overmedicated. He adds that he has hardly been out of bed.

## 2024-07-24 NOTE — ED Provider Notes (Addendum)
 Emergency Medicine Observation Re-evaluation Note  Carlos [name] is a 33 y.o. male, seen on rounds today.  Pt initially presented to the ED for complaints of Psychiatric Evaluation Currently, the patient is resting; awoke easily. No specific complaints the AM   Physical Exam  BP 118/87 (BP Location: Left Arm)   Pulse 87   Temp 99.1 F (37.3 C) (Oral)   Resp 16   SpO2 96%  Physical Exam General: NAD Cardiac: RR Lungs: Non labored Psych: Calm   ED Course / MDM  EKG:EKG Interpretation Date/Time:  Tuesday July 24 2024 06:35:20 EST Ventricular Rate:  70 PR Interval:  138 QRS Duration:  100 QT Interval:  424 QTC Calculation: 457 R Axis:   80  Text Interpretation: Normal sinus rhythm with sinus arrhythmia Normal ECG When compared with ECG of 29-Oct-2022 00:18, PREVIOUS ECG IS PRESENT Confirmed by Trine Likes [phone]) on 07/24/2024 6:42:58 AM  I have reviewed the labs performed to date as well as medications administered while in observation.  Recent changes in the last 24 hours include none.  Plan  Current plan is for TTS/psych eval.    Neysa Caron PARAS, DO 07/24/24 1049    Neysa Caron PARAS, DO 07/24/24 1456
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
## 2024-07-25 DIAGNOSIS — F209 Schizophrenia, unspecified: Secondary | ICD-10-CM | POA: Diagnosis not present

## 2024-07-25 LAB — URINE DRUG SCREEN
Amphetamines: NEGATIVE
Barbiturates: NEGATIVE
Benzodiazepines: NEGATIVE
Cocaine: NEGATIVE
Fentanyl: NEGATIVE
Methadone Scn, Ur: NEGATIVE
Opiates: NEGATIVE
Tetrahydrocannabinol: POSITIVE — AB
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
## 2024-07-25 NOTE — ED Notes (Signed)
 Writer contacted [facility] to give report.

## 2024-07-25 NOTE — ED Provider Notes (Signed)
 Emergency Medicine Observation Re-evaluation Note  Agustus Mane is a 33 y.o. male, seen on rounds today.  Pt initially presented to the ED for complaints of Psychiatric Evaluation Currently, the patient is resting  Physical Exam  BP 105/68 (BP Location: Right Arm)   Pulse (!) 55   Temp 97.9 F (36.6 C) (Oral)   Resp 16   SpO2 100%  Physical Exam General: nad Cardiac: RR Lungs: non labored  Psych: calm   ED Course / MDM  EKG:EKG Interpretation Date/Time:  Tuesday July 24 2024 06:35:20 EST Ventricular Rate:  70 PR Interval:  138 QRS Duration:  100 QT Interval:  424 QTC Calculation: 457 R Axis:   80  Text Interpretation: Normal sinus rhythm with sinus arrhythmia Normal ECG When compared with ECG of 29-Oct-2022 00:18, PREVIOUS ECG IS PRESENT Confirmed by Trine Likes [phone]) on 07/24/2024 6:42:58 AM  I have reviewed the labs performed to date as well as medications administered while in observation.  Recent changes in the last 24 hours include accepted to [facility]  Plan  Current plan is for inpatient psych at [facility].    Neysa Caron PARAS, DO 07/25/24 0830
